# Patient Record
Sex: Female | Born: 1945 | Race: White | Hispanic: No | Marital: Married | Smoking: Never smoker
Health system: Southern US, Community
[De-identification: ages and names within clinical notes are randomized; demographics above are authoritative.]

## PROBLEM LIST (undated history)

## (undated) DIAGNOSIS — C50419 Malignant neoplasm of upper-outer quadrant of unspecified female breast: Secondary | ICD-10-CM

## (undated) DIAGNOSIS — C801 Malignant (primary) neoplasm, unspecified: Secondary | ICD-10-CM

## (undated) DIAGNOSIS — C50919 Malignant neoplasm of unspecified site of unspecified female breast: Secondary | ICD-10-CM

## (undated) DIAGNOSIS — D4819 Other specified neoplasm of uncertain behavior of connective and other soft tissue: Secondary | ICD-10-CM

## (undated) DIAGNOSIS — H43399 Other vitreous opacities, unspecified eye: Secondary | ICD-10-CM

## (undated) DIAGNOSIS — E119 Type 2 diabetes mellitus without complications: Secondary | ICD-10-CM

## (undated) DIAGNOSIS — D481 Neoplasm of uncertain behavior of connective and other soft tissue: Secondary | ICD-10-CM

## (undated) DIAGNOSIS — I10 Essential (primary) hypertension: Secondary | ICD-10-CM

## (undated) DIAGNOSIS — C50619 Malignant neoplasm of axillary tail of unspecified female breast: Secondary | ICD-10-CM

## (undated) DIAGNOSIS — Z1211 Encounter for screening for malignant neoplasm of colon: Secondary | ICD-10-CM

## (undated) DIAGNOSIS — R2230 Localized swelling, mass and lump, unspecified upper limb: Secondary | ICD-10-CM

## (undated) DIAGNOSIS — E785 Hyperlipidemia, unspecified: Secondary | ICD-10-CM

## (undated) HISTORY — DX: Localized swelling, mass and lump, unspecified upper limb: R22.30

## (undated) HISTORY — DX: Other vitreous opacities, unspecified eye: H43.399

## (undated) HISTORY — DX: Malignant (primary) neoplasm, unspecified: C80.1

## (undated) HISTORY — DX: Hyperlipidemia, unspecified: E78.5

## (undated) HISTORY — DX: Malignant neoplasm of axillary tail of unspecified female breast: C50.619

## (undated) HISTORY — DX: Essential (primary) hypertension: I10

## (undated) HISTORY — DX: Encounter for screening for malignant neoplasm of colon: Z12.11

## (undated) HISTORY — DX: Other specified neoplasm of uncertain behavior of connective and other soft tissue: D48.19

## (undated) HISTORY — DX: Neoplasm of uncertain behavior of connective and other soft tissue: D48.1

## (undated) HISTORY — DX: Malignant neoplasm of upper-outer quadrant of unspecified female breast: C50.419

## (undated) HISTORY — DX: Malignant neoplasm of unspecified site of unspecified female breast: C50.919

---
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
## 1975-12-18 HISTORY — PX: PARTIAL HYSTERECTOMY: SHX80

## 1988-12-17 DIAGNOSIS — E785 Hyperlipidemia, unspecified: Secondary | ICD-10-CM

## 1988-12-17 HISTORY — DX: Hyperlipidemia, unspecified: E78.5

## 2006-12-17 HISTORY — PX: COLONOSCOPY: SHX174

## 2007-10-14 ENCOUNTER — Ambulatory Visit: Payer: Self-pay | Admitting: Gastroenterology

## 2007-12-18 DIAGNOSIS — I10 Essential (primary) hypertension: Secondary | ICD-10-CM

## 2007-12-18 HISTORY — DX: Essential (primary) hypertension: I10

## 2008-12-17 DIAGNOSIS — C801 Malignant (primary) neoplasm, unspecified: Secondary | ICD-10-CM

## 2008-12-17 HISTORY — PX: BREAST SURGERY: SHX581

## 2008-12-17 HISTORY — DX: Malignant (primary) neoplasm, unspecified: C80.1

## 2008-12-17 HISTORY — PX: BREAST BIOPSY: SHX20

## 2009-02-09 ENCOUNTER — Ambulatory Visit: Payer: Self-pay | Admitting: General Surgery

## 2009-02-16 ENCOUNTER — Ambulatory Visit: Payer: Self-pay | Admitting: General Surgery

## 2009-02-21 ENCOUNTER — Ambulatory Visit: Payer: Self-pay | Admitting: General Surgery

## 2009-02-21 DIAGNOSIS — C50419 Malignant neoplasm of upper-outer quadrant of unspecified female breast: Secondary | ICD-10-CM

## 2009-02-21 DIAGNOSIS — C50919 Malignant neoplasm of unspecified site of unspecified female breast: Secondary | ICD-10-CM

## 2009-02-21 HISTORY — DX: Malignant neoplasm of upper-outer quadrant of unspecified female breast: C50.419

## 2009-02-21 HISTORY — DX: Malignant neoplasm of unspecified site of unspecified female breast: C50.919

## 2009-03-17 ENCOUNTER — Ambulatory Visit: Payer: Self-pay | Admitting: Radiation Oncology

## 2009-03-28 ENCOUNTER — Ambulatory Visit: Payer: Self-pay | Admitting: Radiation Oncology

## 2009-04-16 ENCOUNTER — Ambulatory Visit: Payer: Self-pay | Admitting: Radiation Oncology

## 2009-05-17 ENCOUNTER — Ambulatory Visit: Payer: Self-pay | Admitting: Radiation Oncology

## 2009-09-16 ENCOUNTER — Ambulatory Visit: Payer: Self-pay | Admitting: Radiation Oncology

## 2009-10-12 ENCOUNTER — Ambulatory Visit: Payer: Self-pay | Admitting: Radiation Oncology

## 2009-10-17 ENCOUNTER — Ambulatory Visit: Payer: Self-pay | Admitting: Radiation Oncology

## 2009-11-09 ENCOUNTER — Ambulatory Visit: Payer: Self-pay | Admitting: General Surgery

## 2010-03-17 ENCOUNTER — Ambulatory Visit: Payer: Self-pay | Admitting: Radiation Oncology

## 2010-03-23 ENCOUNTER — Ambulatory Visit: Payer: Self-pay | Admitting: Radiation Oncology

## 2010-04-16 ENCOUNTER — Ambulatory Visit: Payer: Self-pay | Admitting: Radiation Oncology

## 2010-04-26 ENCOUNTER — Ambulatory Visit: Payer: Self-pay | Admitting: General Surgery

## 2010-11-13 ENCOUNTER — Ambulatory Visit: Payer: Self-pay | Admitting: General Surgery

## 2011-03-21 ENCOUNTER — Ambulatory Visit: Payer: Self-pay | Admitting: Radiation Oncology

## 2011-04-17 ENCOUNTER — Ambulatory Visit: Payer: Self-pay | Admitting: Radiation Oncology

## 2011-05-23 ENCOUNTER — Ambulatory Visit: Payer: Self-pay | Admitting: General Surgery

## 2011-11-28 ENCOUNTER — Ambulatory Visit: Payer: Self-pay | Admitting: General Surgery

## 2012-03-19 ENCOUNTER — Ambulatory Visit: Payer: Self-pay | Admitting: Radiation Oncology

## 2012-04-16 ENCOUNTER — Ambulatory Visit: Payer: Self-pay | Admitting: Radiation Oncology

## 2012-07-28 ENCOUNTER — Ambulatory Visit: Payer: Self-pay

## 2012-08-17 ENCOUNTER — Ambulatory Visit: Payer: Self-pay

## 2012-09-16 ENCOUNTER — Ambulatory Visit: Payer: Self-pay

## 2012-12-01 ENCOUNTER — Ambulatory Visit: Payer: Self-pay | Admitting: General Surgery

## 2012-12-17 DIAGNOSIS — H43399 Other vitreous opacities, unspecified eye: Secondary | ICD-10-CM

## 2012-12-17 HISTORY — DX: Other vitreous opacities, unspecified eye: H43.399

## 2013-03-18 ENCOUNTER — Ambulatory Visit: Payer: Self-pay | Admitting: Radiation Oncology

## 2013-04-15 DIAGNOSIS — I10 Essential (primary) hypertension: Secondary | ICD-10-CM | POA: Diagnosis not present

## 2013-04-15 DIAGNOSIS — E785 Hyperlipidemia, unspecified: Secondary | ICD-10-CM | POA: Diagnosis not present

## 2013-04-15 DIAGNOSIS — E119 Type 2 diabetes mellitus without complications: Secondary | ICD-10-CM | POA: Diagnosis not present

## 2013-04-16 ENCOUNTER — Ambulatory Visit: Payer: Self-pay | Admitting: Radiation Oncology

## 2013-06-10 ENCOUNTER — Encounter: Payer: Self-pay | Admitting: *Deleted

## 2013-06-11 ENCOUNTER — Other Ambulatory Visit: Payer: Self-pay | Admitting: General Surgery

## 2013-06-11 DIAGNOSIS — C50419 Malignant neoplasm of upper-outer quadrant of unspecified female breast: Secondary | ICD-10-CM

## 2013-06-11 NOTE — Telephone Encounter (Signed)
Refill tamoxifen done

## 2013-12-02 ENCOUNTER — Encounter: Payer: Self-pay | Admitting: General Surgery

## 2013-12-02 ENCOUNTER — Ambulatory Visit: Payer: Self-pay | Admitting: General Surgery

## 2013-12-08 ENCOUNTER — Encounter: Payer: Self-pay | Admitting: General Surgery

## 2013-12-08 ENCOUNTER — Ambulatory Visit (INDEPENDENT_AMBULATORY_CARE_PROVIDER_SITE_OTHER): Payer: BC Managed Care – PPO | Admitting: General Surgery

## 2013-12-08 VITALS — BP 124/70 | HR 88 | Resp 14 | Ht 61.0 in | Wt 151.0 lb

## 2013-12-08 DIAGNOSIS — C50919 Malignant neoplasm of unspecified site of unspecified female breast: Secondary | ICD-10-CM

## 2013-12-08 NOTE — Patient Instructions (Signed)
Continue self breast exams. Call office for any new breast issues or concerns. 

## 2013-12-08 NOTE — Progress Notes (Signed)
Patient ID: Tammy Pope, female   DOB: [birth year], 67 y.o.   MRN: [id number]  Chief Complaint  Patient presents with  . Follow-up    mammogram    HPI Tammy Pope is a 67 y.o. female.  who presents for her follow up breast evaluation. The most recent mammogram was done on 12-02-13.  Patient does perform regular self breast checks and gets regular mammograms done.  No new breast issues. Tolerating Tamoxifen without ill effect.  No history of vaginal bleeding.   HPI  Past Medical History  Diagnosis Date  . Cancer 2010    right breast ductal carcinoma in situ  . Hypertension 2009  . 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 Special screening for malignant neoplasms, colon   . Hyperlipidemia 1990  . Malignant neoplasm of upper-outer quadrant of female breast   . Floaters 2014    Past Surgical History  Procedure Laterality Date  . Appendectomy  1956  . Breast surgery Right 2010    wide excision  . Breast biopsy Right 2010    stereo  . Partial hysterectomy  1977  . Colonoscopy  2008    ??? Md    Family History  Problem Relation Age of Onset  . Cancer Other     unknown family members with ovarian,colon,breast cancers    Social History History  Substance Use Topics  . Smoking status: Never Smoker   . Smokeless tobacco: Not on file  . Alcohol Use: No    Allergies  Allergen Reactions  . Sulfa Antibiotics Rash    Current Outpatient Prescriptions  Medication Sig Dispense Refill  . aspirin 81 MG tablet Take 81 mg by mouth daily.      . CRESTOR 5 MG tablet Take 5 mg by mouth daily at 6 PM.       . metFORMIN (GLUCOPHAGE) 500 MG tablet Take 500 mg by mouth 2 (two) times daily with a meal.       . ONE TOUCH ULTRA TEST test strip       . tamoxifen (NOLVADEX) 20 MG tablet TAKE 1 TABLET BY MOUTH EVERY DAY  90 tablet  3  . valsartan-hydrochlorothiazide (DIOVAN-HCT) 320-25 MG per tablet Take 1 tablet by mouth daily.        No current  facility-administered medications for this visit.    Review of Systems Review of Systems  Constitutional: Negative.   Respiratory: Negative.   Cardiovascular: Negative.     Blood pressure 124/70, pulse 88, resp. rate 14, height 5\' 1"  (1.549 m), weight 151 lb (68.493 kg).  Physical Exam Physical Exam  Constitutional: She is oriented to person, place, and time. She appears well-developed and well-nourished.  Neck: Neck supple.  Cardiovascular: Normal rate, regular rhythm and normal heart sounds.   Pulmonary/Chest: Effort normal and breath sounds normal. Right breast exhibits no inverted nipple, no mass, no nipple discharge, no skin change and no tenderness. Left breast exhibits no inverted nipple, no mass, no nipple discharge, no skin change and no tenderness.  Well healed incision right breast upper outer quadrant.   Lymphadenopathy:    She has no cervical adenopathy.    She has no axillary adenopathy.  Neurological: She is alert and oriented to person, place, and time.  Skin: Skin is warm and dry.    Data Reviewed December 02, 2013 bilateral mammograms were reviewed. No interval change.  BI-RAD-2.  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  Plan        The patient will continue her present tamoxifen. We'll plan for a followup exam with bilateral mammograms in one year.One year bilateral diagnostic mammogram and office visit.     Tammy Pope 12/08/2013, 7:36 PM

## 2013-12-31 ENCOUNTER — Encounter: Payer: Self-pay | Admitting: General Surgery

## 2014-06-27 ENCOUNTER — Other Ambulatory Visit: Payer: Self-pay | Admitting: General Surgery

## 2014-07-06 ENCOUNTER — Telehealth: Payer: Self-pay | Admitting: *Deleted

## 2014-07-06 NOTE — Telephone Encounter (Signed)
She called with questions about Oncotype.

## 2014-07-06 NOTE — Telephone Encounter (Signed)
The patient had called because [facility] had contacted her stating she would have a $1000 deductible for the Oncotype DX testing. I told her I would contact them as well. During that time Delsa Sale from [facility] called to say that the sample they received was from March 2010 and that they could only accept specimens less than 69 years old. Test cancelled, pt aware.

## 2014-07-08 NOTE — Telephone Encounter (Signed)
I talked with the patient. She did say she had only met $23 of her $1000 deductible so she would be paying for the test. According to [facility] they do fill with insurance. Patient prefers to keep taking the Tamoxifen and not have the testing completed.

## 2014-10-06 DIAGNOSIS — E119 Type 2 diabetes mellitus without complications: Secondary | ICD-10-CM | POA: Diagnosis not present

## 2014-10-06 DIAGNOSIS — E785 Hyperlipidemia, unspecified: Secondary | ICD-10-CM | POA: Diagnosis not present

## 2014-10-06 DIAGNOSIS — I10 Essential (primary) hypertension: Secondary | ICD-10-CM | POA: Diagnosis not present

## 2014-10-18 ENCOUNTER — Encounter: Payer: Self-pay | Admitting: General Surgery

## 2014-12-03 ENCOUNTER — Encounter: Payer: Self-pay | Admitting: General Surgery

## 2014-12-03 ENCOUNTER — Ambulatory Visit: Payer: Self-pay | Admitting: General Surgery

## 2014-12-14 ENCOUNTER — Ambulatory Visit (INDEPENDENT_AMBULATORY_CARE_PROVIDER_SITE_OTHER): Payer: BC Managed Care – PPO | Admitting: General Surgery

## 2014-12-14 ENCOUNTER — Other Ambulatory Visit: Payer: BC Managed Care – PPO

## 2014-12-14 ENCOUNTER — Encounter: Payer: Self-pay | Admitting: General Surgery

## 2014-12-14 VITALS — BP 124/64 | HR 68 | Resp 14 | Ht 61.0 in

## 2014-12-14 DIAGNOSIS — N6459 Other signs and symptoms in breast: Secondary | ICD-10-CM

## 2014-12-14 DIAGNOSIS — D0511 Intraductal carcinoma in situ of right breast: Secondary | ICD-10-CM

## 2014-12-14 DIAGNOSIS — C50911 Malignant neoplasm of unspecified site of right female breast: Secondary | ICD-10-CM | POA: Diagnosis not present

## 2014-12-14 DIAGNOSIS — R2232 Localized swelling, mass and lump, left upper limb: Secondary | ICD-10-CM

## 2014-12-14 HISTORY — PX: OTHER SURGICAL HISTORY: SHX169

## 2014-12-14 NOTE — Progress Notes (Signed)
Patient ID: Tammy Pope, female   DOB: 1946/08/07, 68 y.o.   MRN: [id number]  Chief Complaint  Patient presents with  . Follow-up    mammogram    HPI Tammy Pope is a 68 y.o. female. who presents for a breast evaluation. The most recent mammogram was done on 12/03/14 .  Patient does perform regular self breast checks and gets regular mammograms done.    The patient reports no change on her own self-exam and denies any episodes of trauma to either breast.  HPI  Past Medical History  Diagnosis Date  . Hypertension 2009  . 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 Special screening for malignant neoplasms, colon   . Hyperlipidemia 1990  . Floaters 2014  . Cancer 2010    right breast ductal carcinoma in situ  . 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 ER 90%, PR 50%, MammoSite partial breast radiation    Past Surgical History  Procedure Laterality Date  . Appendectomy  1956  . Partial hysterectomy  1977  . Colonoscopy  2008    ??? Md  . Breast surgery Right 2010    wide excision  . Breast biopsy Right 2010    stereo    Family History  Problem Relation Age of Onset  . Cancer Other     unknown family members with ovarian,colon,breast cancers    Social History History  Substance Use Topics  . Smoking status: Never Smoker   . Smokeless tobacco: Not on file  . Alcohol Use: No    Allergies  Allergen Reactions  . Sulfa Antibiotics Rash    Current Outpatient Prescriptions  Medication Sig Dispense Refill  . aspirin 81 MG tablet Take 81 mg by mouth daily.    . CRESTOR 5 MG tablet Take 5 mg by mouth daily at 6 PM.     . metFORMIN (GLUCOPHAGE) 500 MG tablet Take 500 mg by mouth 2 (two) times daily with a meal.     . ONE TOUCH ULTRA TEST test strip     . tamoxifen (NOLVADEX) 20 MG tablet TAKE 1 TABLET BY MOUTH EVERY DAY 90  tablet 3  . valsartan-hydrochlorothiazide (DIOVAN-HCT) 320-25 MG per tablet Take 1 tablet by mouth daily.      No current facility-administered medications for this visit.    Review of Systems Review of Systems  Constitutional: Negative.   Respiratory: Negative.   Cardiovascular: Negative.     Blood pressure 124/64, pulse 68, resp. rate 14, height 5\' 1"  (1.549 m).  Physical Exam Physical Exam  Constitutional: She is oriented to person, place, and time. She appears well-developed and well-nourished.  Eyes: Conjunctivae are normal. No scleral icterus.  Neck: Neck supple.  Cardiovascular: Normal rate, regular rhythm and normal heart sounds.   Pulmonary/Chest: Effort normal and breath sounds normal. Right breast exhibits no inverted nipple, no mass, no nipple discharge, no skin change and no tenderness. Left breast exhibits no inverted nipple, no mass, no nipple discharge, no skin change and no tenderness.    Thickening at the bottom of the left axilla. Right breast incision well healed 9 o'clock.   Abdominal: Soft. Bowel sounds are normal. There is no tenderness.  Lymphadenopathy:    She has no cervical adenopathy.    She has no axillary adenopathy.  Neurological: She is alert and oriented to person, place,  and time.  Skin: Skin is warm and dry.    Data Reviewed Bilateral diagnostic mammograms dated 12/03/2014 were reviewed. I read-2.  On independent review, there is no parenchymal density in the upper-outer quadrant of the left breast. Adenopathy is identified in the left axilla not previously appreciated.  Ultrasound examination of the superior aspect of the left breast and axilla showed one small normal-appearing node measuring 0.67 x 0.5 x 0.86 at the lower limit of the axilla. Adjacent to this was ill-defined multilobulated hypoechoic mass measuring 1.8 x 1.8 x 2.6 cm. No significant vascular flow was appreciated.  The patient was amenable for biopsy of the left axillary  tail/left axillary mass. This was completed making use of 10 mL of 0.5% Xylocaine with 0.25% Marcaine with 1-200,000 of epinephrine. ChloraPrep was applied to the skin. A 14-gauge Finesse device was utilized and multiple core samples from multiple locations completed without difficulty. A postbiopsy clip was placed.  Skin defect was closed with benzoin and Steri-Strips followed by Telfa and Tegaderm dressing.  Written instructions for post procedure wound care were provided.   Assessment    No evidence of recurrent right breast DCIS.  New left breast/axillary mass.    Plan       The patient will be contacted when biopsy results are available and future treatment plans made based on those results. PCP:  Kirkland Hun 12/15/2014, 7:31 AM

## 2014-12-14 NOTE — Patient Instructions (Signed)

## 2014-12-15 ENCOUNTER — Telehealth: Payer: Self-pay | Admitting: *Deleted

## 2014-12-15 ENCOUNTER — Other Ambulatory Visit: Payer: Self-pay | Admitting: *Deleted

## 2014-12-15 DIAGNOSIS — C761 Malignant neoplasm of thorax: Secondary | ICD-10-CM

## 2014-12-15 DIAGNOSIS — R2230 Localized swelling, mass and lump, unspecified upper limb: Secondary | ICD-10-CM | POA: Insufficient documentation

## 2014-12-15 DIAGNOSIS — D0511 Intraductal carcinoma in situ of right breast: Secondary | ICD-10-CM

## 2014-12-15 HISTORY — DX: Localized swelling, mass and lump, unspecified upper limb: R22.30

## 2014-12-15 NOTE — Telephone Encounter (Signed)
She is aware of appointment for tomorrow with Dr. Bary Castilla. She is asking for some nerve medication with the upcoming appointment. She is aware that the news tomorrow may not be good news. Dr Bary Castilla prefers to wait until tomorrow to discuss anxiety medications, pt agrees. States her husband is coming with her.

## 2014-12-16 ENCOUNTER — Ambulatory Visit: Payer: Self-pay | Admitting: Oncology

## 2014-12-16 ENCOUNTER — Ambulatory Visit (INDEPENDENT_AMBULATORY_CARE_PROVIDER_SITE_OTHER): Payer: BLUE CROSS/BLUE SHIELD | Admitting: General Surgery

## 2014-12-16 ENCOUNTER — Encounter: Payer: Self-pay | Admitting: General Surgery

## 2014-12-16 VITALS — BP 130/82 | HR 94 | Resp 12 | Ht 61.0 in | Wt 147.0 lb

## 2014-12-16 DIAGNOSIS — C50619 Malignant neoplasm of axillary tail of unspecified female breast: Secondary | ICD-10-CM

## 2014-12-16 DIAGNOSIS — D0511 Intraductal carcinoma in situ of right breast: Secondary | ICD-10-CM

## 2014-12-16 DIAGNOSIS — C761 Malignant neoplasm of thorax: Secondary | ICD-10-CM

## 2014-12-16 HISTORY — DX: Malignant neoplasm of axillary tail of unspecified female breast: C50.619

## 2014-12-16 NOTE — Progress Notes (Signed)
Patient ID: Tammy Pope, female   DOB: 16-Feb-1946, 68 y.o.   MRN: [id number]  Chief Complaint  Patient presents with  . Follow-up    HPI Tammy Pope is a 68 y.o. female.  Here today for follow up left breast/axilla mass biopsy completed on December 29.Tammy Pope, Tammy Pope, present, and her daughter, Tammy Pope.   HPI  Past Medical History  Diagnosis Date  . Hypertension 2009  . 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 Special screening for malignant neoplasms, colon   . Hyperlipidemia 1990  . Floaters 2014  . Cancer 2010    right breast ductal carcinoma in situ  . 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 ER 90%, PR 50%, MammoSite partial breast radiation    Past Surgical History  Procedure Laterality Date  . Appendectomy  1956  . Partial hysterectomy  1977  . Colonoscopy  2008    ??? Md  . Breast surgery Right 2010    wide excision  . Breast biopsy Right 2010    stereo  . Left axilla biopsy Left 12-14-14    Family History  Problem Relation Age of Onset  . Cancer Other     unknown family members with ovarian,colon,breast cancers    Social History History  Substance Use Topics  . Smoking status: Never Smoker   . Smokeless tobacco: Not on file  . Alcohol Use: No    Allergies  Allergen Reactions  . Sulfa Antibiotics Rash    Current Outpatient Prescriptions  Medication Sig Dispense Refill  . aspirin 81 MG tablet Take 81 mg by mouth daily.    . CRESTOR 5 MG tablet Take 5 mg by mouth daily at 6 PM.     . metFORMIN (GLUCOPHAGE) 500 MG tablet Take 500 mg by mouth 2 (two) times daily with a meal.     . ONE TOUCH ULTRA TEST test strip     . tamoxifen (NOLVADEX) 20 MG tablet TAKE 1 TABLET BY MOUTH EVERY DAY 90 tablet 3  . valsartan-hydrochlorothiazide (DIOVAN-HCT) 320-25 MG per tablet Take 1 tablet by mouth daily.      No  current facility-administered medications for this visit.    Review of Systems Review of Systems  Constitutional: Negative.   Respiratory: Negative.   Cardiovascular: Negative.     Blood pressure 130/82, pulse 94, resp. rate 12, height 5\' 1"  (1.549 m), weight 147 lb (66.679 kg).  Physical Exam Physical Exam  Constitutional: She is oriented to person, place, and time. She appears well-developed and well-nourished.  Pulmonary/Chest:  Left axilla wound clean, healing well, steri strip intact. Minimal bruising.  Neurological: She is alert and oriented to person, place, and time.  Skin: Skin is warm and dry.    Data Reviewed Pathology shows high-grade carcinoma. Receptor status pending. No lymph node tissue noted.  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    Plan    Details of the pathology report were reviewed. It's unclear whether this is a breast primary (sees what I favor) rather than metastatic disease to an axillary node (less likely). The absence of any mass on mammography may be a combination of location and the potential for lobular features.  With this sizable tumor, she would certainly be a candidate for neoadjuvant chemotherapy considering the high-grade status noted on pathology. The  advisability of meeting with medical oncology early has been discussed. She is aware that frequently chemotherapy will be initiated first to see response and he confirmed that the appropriate age and has been chosen and then surgery would follow.  The patient's husband expressed concern that she is expressed to him about the possibility of losing her breast. Even without a clinical response to neoadjuvant chemotherapy mastectomy would not be mandatory.  I've recommended a breast MRI to help determine if any other areas of concern are evident in the left breast and to help better define that this is a breast primary.  We'll obtain baseline laboratory studies today  including a CBC, comprehensive metabolic panel, CEA and CA 27-29. Her graft the patient will contact the MRI center (would previously contacted her) to schedule her MRI. Referral to medical oncology is in process.  The likelihood that a port for chemotherapy administration would be requested was discussed with the patient and her family. The risks associated with the procedure including bleeding, infection and lung injury were reviewed. The bottom line would be it's unlikely should be able tolerate treatment without a port, and the small but real risks are our off set by facilitating her chemotherapy treatments.  Timing for port placement will be put on hold pending medical oncology evaluation. An appointment has been scheduled for the patient at the [facility] for 12-21-14 at 3 pm.  The patient requested an "nerve pill" to help with adjustment to this new, unexpected diagnosis of cancer. A prescription for Xanax 0.5 mg, #30 with the inscription one by mouth every 8 hours when necessary for anxiety/nerves with no refills was provided.     PCP:  Kirkland Hun 12/16/2014, 1:56 PM

## 2014-12-16 NOTE — Patient Instructions (Signed)
Continue self breast exams. Call office for any new breast issues or concerns. 

## 2014-12-17 LAB — COMPREHENSIVE METABOLIC PANEL
ALT: 28 IU/L (ref 0–32)
AST: 43 IU/L — ABNORMAL HIGH (ref 0–40)
Albumin/Globulin Ratio: 1.4 (ref 1.1–2.5)
Albumin: 4.5 g/dL (ref 3.6–4.8)
Alkaline Phosphatase: 90 IU/L (ref 39–117)
BUN/Creatinine Ratio: 14 (ref 11–26)
BUN: 11 mg/dL (ref 8–27)
CHLORIDE: 96 mmol/L — AB (ref 97–108)
CO2: 25 mmol/L (ref 18–29)
CREATININE: 0.76 mg/dL (ref 0.57–1.00)
Calcium: 9.7 mg/dL (ref 8.7–10.3)
GFR calc Af Amer: 93 mL/min/{1.73_m2} (ref >59)
GFR calc non Af Amer: 80 mL/min/{1.73_m2} (ref >59)
Globulin, Total: 3.3 g/dL (ref 1.5–4.5)
Glucose: 120 mg/dL — ABNORMAL HIGH (ref 65–99)
Potassium: 3.7 mmol/L (ref 3.5–5.2)
SODIUM: 139 mmol/L (ref 134–144)
TOTAL PROTEIN: 7.8 g/dL (ref 6.0–8.5)
Total Bilirubin: 0.5 mg/dL (ref 0.0–1.2)

## 2014-12-17 LAB — CBC WITH DIFFERENTIAL
BASOS ABS: 0 10*3/uL (ref 0.0–0.2)
Basos: 0 %
EOS ABS: 0.4 10*3/uL (ref 0.0–0.4)
Eos: 4 %
HCT: 38.9 % (ref 34.0–46.6)
Hemoglobin: 12.9 g/dL (ref 11.1–15.9)
IMMATURE GRANULOCYTES: 0 %
Immature Grans (Abs): 0 10*3/uL (ref 0.0–0.1)
Lymphocytes Absolute: 2.9 10*3/uL (ref 0.7–3.1)
Lymphs: 27 %
MCH: 30 pg (ref 26.6–33.0)
MCHC: 33.2 g/dL (ref 31.5–35.7)
MCV: 91 fL (ref 79–97)
MONOCYTES: 7 %
Monocytes Absolute: 0.7 10*3/uL (ref 0.1–0.9)
Neutrophils Absolute: 6.6 10*3/uL (ref 1.4–7.0)
Neutrophils Relative %: 62 %
PLATELETS: 206 10*3/uL (ref 150–379)
RBC: 4.3 x10E6/uL (ref 3.77–5.28)
RDW: 13.7 % (ref 12.3–15.4)
WBC: 10.7 10*3/uL (ref 3.4–10.8)

## 2014-12-17 LAB — CANCER ANTIGEN 27.29: CA 27.29: 35.9 U/mL (ref 0.0–38.6)

## 2014-12-17 LAB — CEA: CEA: 2.2 ng/mL (ref 0.0–4.7)

## 2014-12-20 ENCOUNTER — Telehealth: Payer: Self-pay | Admitting: *Deleted

## 2014-12-20 NOTE — Telephone Encounter (Signed)
Message has been left on patient's number, spouse's number Jori Moll), and daughter's number (Tammy Madren).   Per [employer], insurance termed on 12-16-14. There is also a question as to if patient only has Medicare Part A coverage or both Part A and Part B coverage.   Patient is scheduled for a breast MRI on 12-25-14. We need to find out what she is covered under for January 2016 and will need copy of insurance card for pre-cert.

## 2014-12-20 NOTE — Telephone Encounter (Signed)
Patient called the office back and was notified accordingly. She will check on this and notify the office once she finds out details.

## 2014-12-21 ENCOUNTER — Ambulatory Visit: Payer: BC Managed Care – PPO

## 2014-12-21 ENCOUNTER — Ambulatory Visit: Payer: Self-pay | Admitting: Oncology

## 2014-12-25 ENCOUNTER — Ambulatory Visit
Admission: RE | Admit: 2014-12-25 | Discharge: 2014-12-25 | Disposition: A | Discharge status: Home / Self Care | Payer: BLUE CROSS/BLUE SHIELD | Source: Ambulatory Visit | Attending: General Surgery | Admitting: General Surgery

## 2014-12-25 DIAGNOSIS — C761 Malignant neoplasm of thorax: Secondary | ICD-10-CM

## 2014-12-25 DIAGNOSIS — D0511 Intraductal carcinoma in situ of right breast: Secondary | ICD-10-CM

## 2014-12-25 DIAGNOSIS — C50412 Malignant neoplasm of upper-outer quadrant of left female breast: Secondary | ICD-10-CM | POA: Diagnosis not present

## 2014-12-25 MED ORDER — GADOBENATE DIMEGLUMINE 529 MG/ML IV SOLN
14.0000 mL | Freq: Once | INTRAVENOUS | Status: AC | PRN
  Administered 2014-12-25: 14 mL via INTRAVENOUS

## 2014-12-27 ENCOUNTER — Telehealth: Payer: Self-pay

## 2014-12-27 DIAGNOSIS — C50612 Malignant neoplasm of axillary tail of left female breast: Secondary | ICD-10-CM | POA: Diagnosis not present

## 2014-12-27 DIAGNOSIS — Z170 Estrogen receptor positive status [ER+]: Secondary | ICD-10-CM | POA: Diagnosis not present

## 2014-12-27 NOTE — Telephone Encounter (Signed)
Christina at Dr Metro Kung office called and said that Dr Oliva Bustard wants the patient to have a port placement. They want to start chemo on 01/03/15. The patient was seen here last on 12/16/14 & 12/14/14. Just need to know if it is all right to schedule her and if she needs to be seen here prior to surgery.

## 2014-12-28 ENCOUNTER — Telehealth: Payer: Self-pay

## 2014-12-28 ENCOUNTER — Ambulatory Visit: Payer: Self-pay | Admitting: Oncology

## 2014-12-28 ENCOUNTER — Other Ambulatory Visit: Payer: Self-pay | Admitting: General Surgery

## 2014-12-28 DIAGNOSIS — C7951 Secondary malignant neoplasm of bone: Secondary | ICD-10-CM | POA: Diagnosis not present

## 2014-12-28 DIAGNOSIS — C7802 Secondary malignant neoplasm of left lung: Secondary | ICD-10-CM | POA: Diagnosis not present

## 2014-12-28 DIAGNOSIS — C50919 Malignant neoplasm of unspecified site of unspecified female breast: Secondary | ICD-10-CM | POA: Diagnosis not present

## 2014-12-28 DIAGNOSIS — C50912 Malignant neoplasm of unspecified site of left female breast: Secondary | ICD-10-CM | POA: Diagnosis not present

## 2014-12-28 DIAGNOSIS — C779 Secondary and unspecified malignant neoplasm of lymph node, unspecified: Secondary | ICD-10-CM | POA: Diagnosis not present

## 2014-12-28 DIAGNOSIS — C7800 Secondary malignant neoplasm of unspecified lung: Secondary | ICD-10-CM | POA: Diagnosis not present

## 2014-12-28 DIAGNOSIS — C7801 Secondary malignant neoplasm of right lung: Secondary | ICD-10-CM | POA: Diagnosis not present

## 2014-12-28 DIAGNOSIS — C761 Malignant neoplasm of thorax: Secondary | ICD-10-CM

## 2014-12-28 NOTE — Telephone Encounter (Signed)
Spoke with patient about scheduling her port placement. Patient is scheduled for surgery at [facility] on 12/31/14. She will pre admit by phone. She is aware to call the day before to find out her arrival time. Patient is aware of date and all surgery instructions.

## 2014-12-28 NOTE — Telephone Encounter (Signed)
Spoke with Tammy Pope at the [facility] and asked her know to notify Dr Metro Kung office that we will be placing the patient's port on 12/31/14. She is aware and will relay the message.

## 2014-12-28 NOTE — Telephone Encounter (Signed)
Scheduling slip on your desk.

## 2014-12-30 ENCOUNTER — Ambulatory Visit: Payer: Self-pay | Admitting: Anesthesiology

## 2014-12-30 DIAGNOSIS — C50919 Malignant neoplasm of unspecified site of unspecified female breast: Secondary | ICD-10-CM | POA: Diagnosis not present

## 2014-12-30 DIAGNOSIS — Z0189 Encounter for other specified special examinations: Secondary | ICD-10-CM | POA: Diagnosis not present

## 2014-12-30 DIAGNOSIS — C50612 Malignant neoplasm of axillary tail of left female breast: Secondary | ICD-10-CM | POA: Diagnosis not present

## 2014-12-31 ENCOUNTER — Ambulatory Visit: Payer: Self-pay | Admitting: General Surgery

## 2014-12-31 DIAGNOSIS — C7951 Secondary malignant neoplasm of bone: Secondary | ICD-10-CM | POA: Diagnosis not present

## 2014-12-31 DIAGNOSIS — E785 Hyperlipidemia, unspecified: Secondary | ICD-10-CM | POA: Diagnosis not present

## 2014-12-31 DIAGNOSIS — C761 Malignant neoplasm of thorax: Secondary | ICD-10-CM | POA: Diagnosis not present

## 2014-12-31 DIAGNOSIS — I10 Essential (primary) hypertension: Secondary | ICD-10-CM | POA: Diagnosis not present

## 2014-12-31 DIAGNOSIS — E119 Type 2 diabetes mellitus without complications: Secondary | ICD-10-CM | POA: Diagnosis not present

## 2014-12-31 DIAGNOSIS — Z880 Allergy status to penicillin: Secondary | ICD-10-CM | POA: Diagnosis not present

## 2014-12-31 DIAGNOSIS — Z8041 Family history of malignant neoplasm of ovary: Secondary | ICD-10-CM | POA: Diagnosis not present

## 2014-12-31 DIAGNOSIS — Z7982 Long term (current) use of aspirin: Secondary | ICD-10-CM | POA: Diagnosis not present

## 2014-12-31 DIAGNOSIS — D0511 Intraductal carcinoma in situ of right breast: Secondary | ICD-10-CM

## 2014-12-31 DIAGNOSIS — C50912 Malignant neoplasm of unspecified site of left female breast: Secondary | ICD-10-CM | POA: Diagnosis not present

## 2014-12-31 DIAGNOSIS — I517 Cardiomegaly: Secondary | ICD-10-CM | POA: Diagnosis not present

## 2014-12-31 DIAGNOSIS — Z800 Family history of malignant neoplasm of digestive organs: Secondary | ICD-10-CM | POA: Diagnosis not present

## 2014-12-31 DIAGNOSIS — Z79899 Other long term (current) drug therapy: Secondary | ICD-10-CM | POA: Diagnosis not present

## 2014-12-31 DIAGNOSIS — Z452 Encounter for adjustment and management of vascular access device: Secondary | ICD-10-CM | POA: Diagnosis not present

## 2014-12-31 DIAGNOSIS — Z803 Family history of malignant neoplasm of breast: Secondary | ICD-10-CM | POA: Diagnosis not present

## 2014-12-31 DIAGNOSIS — Z882 Allergy status to sulfonamides status: Secondary | ICD-10-CM | POA: Diagnosis not present

## 2015-01-03 ENCOUNTER — Encounter: Payer: Self-pay | Admitting: General Surgery

## 2015-01-03 DIAGNOSIS — Z170 Estrogen receptor positive status [ER+]: Secondary | ICD-10-CM | POA: Diagnosis not present

## 2015-01-03 DIAGNOSIS — C773 Secondary and unspecified malignant neoplasm of axilla and upper limb lymph nodes: Secondary | ICD-10-CM | POA: Diagnosis not present

## 2015-01-03 DIAGNOSIS — C50612 Malignant neoplasm of axillary tail of left female breast: Secondary | ICD-10-CM | POA: Diagnosis not present

## 2015-01-03 DIAGNOSIS — C7951 Secondary malignant neoplasm of bone: Secondary | ICD-10-CM | POA: Diagnosis not present

## 2015-01-03 LAB — COMPREHENSIVE METABOLIC PANEL
ALBUMIN: 3.6 g/dL (ref 3.4–5.0)
ALT: 36 U/L
ANION GAP: 10 (ref 7–16)
Alkaline Phosphatase: 99 U/L
BUN: 10 mg/dL (ref 7–18)
Bilirubin,Total: 0.4 mg/dL (ref 0.2–1.0)
CALCIUM: 8.7 mg/dL (ref 8.5–10.1)
Chloride: 102 mmol/L (ref 98–107)
Co2: 28 mmol/L (ref 21–32)
Creatinine: 0.79 mg/dL (ref 0.60–1.30)
EGFR (African American): >60
GLUCOSE: 123 mg/dL — AB (ref 65–99)
Osmolality: 280 (ref 275–301)
POTASSIUM: 3.8 mmol/L (ref 3.5–5.1)
SGOT(AST): 41 U/L — ABNORMAL HIGH (ref 15–37)
Sodium: 140 mmol/L (ref 136–145)
Total Protein: 7.8 g/dL (ref 6.4–8.2)

## 2015-01-03 LAB — CBC CANCER CENTER
Basophil #: 0.1 x10 3/mm (ref 0.0–0.1)
Basophil %: 1 %
EOS PCT: 3.7 %
Eosinophil #: 0.4 x10 3/mm (ref 0.0–0.7)
HCT: 36.4 % (ref 35.0–47.0)
HGB: 12.2 g/dL (ref 12.0–16.0)
LYMPHS ABS: 2.3 x10 3/mm (ref 1.0–3.6)
Lymphocyte %: 23.3 %
MCH: 29.9 pg (ref 26.0–34.0)
MCHC: 33.4 g/dL (ref 32.0–36.0)
MCV: 90 fL (ref 80–100)
MONO ABS: 0.6 x10 3/mm (ref 0.2–0.9)
MONOS PCT: 5.8 %
NEUTROS ABS: 6.5 x10 3/mm (ref 1.4–6.5)
Neutrophil %: 66.2 %
Platelet: 182 x10 3/mm (ref 150–440)
RBC: 4.06 10*6/uL (ref 3.80–5.20)
RDW: 13.7 % (ref 11.5–14.5)
WBC: 9.8 x10 3/mm (ref 3.6–11.0)

## 2015-01-10 LAB — CBC CANCER CENTER
BASOS PCT: 0.7 %
Basophil #: 0.1 x10 3/mm (ref 0.0–0.1)
Eosinophil #: 0.1 x10 3/mm (ref 0.0–0.7)
Eosinophil %: 0.6 %
HCT: 36.9 % (ref 35.0–47.0)
HGB: 12 g/dL (ref 12.0–16.0)
Lymphocyte #: 3.3 x10 3/mm (ref 1.0–3.6)
Lymphocyte %: 16.7 %
MCH: 28.9 pg (ref 26.0–34.0)
MCHC: 32.6 g/dL (ref 32.0–36.0)
MCV: 89 fL (ref 80–100)
Monocyte #: 1.5 x10 3/mm — ABNORMAL HIGH (ref 0.2–0.9)
Monocyte %: 7.3 %
NEUTROS ABS: 14.9 x10 3/mm — AB (ref 1.4–6.5)
Neutrophil %: 74.7 %
Platelet: 252 x10 3/mm (ref 150–440)
RBC: 4.16 10*6/uL (ref 3.80–5.20)
RDW: 13.7 % (ref 11.5–14.5)
WBC: 20 x10 3/mm — ABNORMAL HIGH (ref 3.6–11.0)

## 2015-01-12 ENCOUNTER — Other Ambulatory Visit: Payer: Self-pay | Admitting: General Surgery

## 2015-01-13 ENCOUNTER — Telehealth: Payer: Self-pay | Admitting: *Deleted

## 2015-01-13 NOTE — Telephone Encounter (Signed)
I talked with the patient and she does take the Xanax occasionally, ok for this time per Dr. Bary Castilla. Patient aware to pick up RX.

## 2015-01-17 ENCOUNTER — Ambulatory Visit: Payer: Self-pay | Admitting: Oncology

## 2015-01-17 DIAGNOSIS — Z90710 Acquired absence of both cervix and uterus: Secondary | ICD-10-CM | POA: Diagnosis not present

## 2015-01-17 DIAGNOSIS — C7951 Secondary malignant neoplasm of bone: Secondary | ICD-10-CM | POA: Diagnosis not present

## 2015-01-17 DIAGNOSIS — Z170 Estrogen receptor positive status [ER+]: Secondary | ICD-10-CM | POA: Diagnosis not present

## 2015-01-17 DIAGNOSIS — I10 Essential (primary) hypertension: Secondary | ICD-10-CM | POA: Diagnosis not present

## 2015-01-17 DIAGNOSIS — Z9049 Acquired absence of other specified parts of digestive tract: Secondary | ICD-10-CM | POA: Diagnosis not present

## 2015-01-17 DIAGNOSIS — E780 Pure hypercholesterolemia: Secondary | ICD-10-CM | POA: Diagnosis not present

## 2015-01-17 DIAGNOSIS — Z853 Personal history of malignant neoplasm of breast: Secondary | ICD-10-CM | POA: Diagnosis not present

## 2015-01-17 DIAGNOSIS — C773 Secondary and unspecified malignant neoplasm of axilla and upper limb lymph nodes: Secondary | ICD-10-CM | POA: Diagnosis not present

## 2015-01-17 DIAGNOSIS — R918 Other nonspecific abnormal finding of lung field: Secondary | ICD-10-CM | POA: Diagnosis not present

## 2015-01-17 DIAGNOSIS — E119 Type 2 diabetes mellitus without complications: Secondary | ICD-10-CM | POA: Diagnosis not present

## 2015-01-17 DIAGNOSIS — Z7982 Long term (current) use of aspirin: Secondary | ICD-10-CM | POA: Diagnosis not present

## 2015-01-17 DIAGNOSIS — F419 Anxiety disorder, unspecified: Secondary | ICD-10-CM | POA: Diagnosis not present

## 2015-01-17 DIAGNOSIS — R42 Dizziness and giddiness: Secondary | ICD-10-CM | POA: Diagnosis not present

## 2015-01-17 DIAGNOSIS — Z79899 Other long term (current) drug therapy: Secondary | ICD-10-CM | POA: Diagnosis not present

## 2015-01-17 DIAGNOSIS — Z5111 Encounter for antineoplastic chemotherapy: Secondary | ICD-10-CM | POA: Diagnosis not present

## 2015-01-17 DIAGNOSIS — C50612 Malignant neoplasm of axillary tail of left female breast: Secondary | ICD-10-CM | POA: Diagnosis not present

## 2015-01-17 DIAGNOSIS — R5383 Other fatigue: Secondary | ICD-10-CM | POA: Diagnosis not present

## 2015-01-17 DIAGNOSIS — R531 Weakness: Secondary | ICD-10-CM | POA: Diagnosis not present

## 2015-01-17 LAB — CBC CANCER CENTER
BASOS PCT: 0.4 %
Basophil #: 0.1 x10 3/mm (ref 0.0–0.1)
Eosinophil #: 0 x10 3/mm (ref 0.0–0.7)
Eosinophil %: 0 %
HCT: 36.4 % (ref 35.0–47.0)
HGB: 12.1 g/dL (ref 12.0–16.0)
LYMPHS ABS: 2.1 x10 3/mm (ref 1.0–3.6)
LYMPHS PCT: 17.3 %
MCH: 29.1 pg (ref 26.0–34.0)
MCHC: 33.2 g/dL (ref 32.0–36.0)
MCV: 88 fL (ref 80–100)
MONO ABS: 0.6 x10 3/mm (ref 0.2–0.9)
MONOS PCT: 4.7 %
Neutrophil #: 9.4 x10 3/mm — ABNORMAL HIGH (ref 1.4–6.5)
Neutrophil %: 77.6 %
PLATELETS: 107 x10 3/mm — AB (ref 150–440)
RBC: 4.15 10*6/uL (ref 3.80–5.20)
RDW: 14 % (ref 11.5–14.5)
WBC: 12.1 x10 3/mm — ABNORMAL HIGH (ref 3.6–11.0)

## 2015-01-20 DIAGNOSIS — C50612 Malignant neoplasm of axillary tail of left female breast: Secondary | ICD-10-CM | POA: Diagnosis not present

## 2015-01-20 DIAGNOSIS — Z170 Estrogen receptor positive status [ER+]: Secondary | ICD-10-CM | POA: Diagnosis not present

## 2015-01-20 DIAGNOSIS — C7951 Secondary malignant neoplasm of bone: Secondary | ICD-10-CM | POA: Diagnosis not present

## 2015-01-20 DIAGNOSIS — C773 Secondary and unspecified malignant neoplasm of axilla and upper limb lymph nodes: Secondary | ICD-10-CM | POA: Diagnosis not present

## 2015-01-20 LAB — COMPREHENSIVE METABOLIC PANEL
ALBUMIN: 3.5 g/dL (ref 3.4–5.0)
ALT: 59 U/L (ref 14–63)
AST: 42 U/L — AB (ref 15–37)
Alkaline Phosphatase: 127 U/L — ABNORMAL HIGH (ref 46–116)
Anion Gap: 7 (ref 7–16)
BUN: 11 mg/dL (ref 7–18)
Bilirubin,Total: 0.3 mg/dL (ref 0.2–1.0)
Calcium, Total: 8.7 mg/dL (ref 8.5–10.1)
Chloride: 102 mmol/L (ref 98–107)
Co2: 35 mmol/L — ABNORMAL HIGH (ref 21–32)
Creatinine: 0.76 mg/dL (ref 0.60–1.30)
EGFR (African American): >60
GLUCOSE: 143 mg/dL — AB (ref 65–99)
Osmolality: 289 (ref 275–301)
POTASSIUM: 3.1 mmol/L — AB (ref 3.5–5.1)
Sodium: 144 mmol/L (ref 136–145)
TOTAL PROTEIN: 7.3 g/dL (ref 6.4–8.2)

## 2015-01-20 LAB — CBC CANCER CENTER
BASOS PCT: 1.2 %
Basophil #: 0.1 x10 3/mm (ref 0.0–0.1)
EOS PCT: 0.1 %
Eosinophil #: 0 x10 3/mm (ref 0.0–0.7)
HCT: 36.6 % (ref 35.0–47.0)
HGB: 12.2 g/dL (ref 12.0–16.0)
LYMPHS PCT: 25.1 %
Lymphocyte #: 1.9 x10 3/mm (ref 1.0–3.6)
MCH: 29.3 pg (ref 26.0–34.0)
MCHC: 33.2 g/dL (ref 32.0–36.0)
MCV: 88 fL (ref 80–100)
MONOS PCT: 7 %
Monocyte #: 0.5 x10 3/mm (ref 0.2–0.9)
Neutrophil #: 5.1 x10 3/mm (ref 1.4–6.5)
Neutrophil %: 66.6 %
PLATELETS: 145 x10 3/mm — AB (ref 150–440)
RBC: 4.15 10*6/uL (ref 3.80–5.20)
RDW: 14 % (ref 11.5–14.5)
WBC: 7.7 x10 3/mm (ref 3.6–11.0)

## 2015-02-14 DIAGNOSIS — C773 Secondary and unspecified malignant neoplasm of axilla and upper limb lymph nodes: Secondary | ICD-10-CM | POA: Diagnosis not present

## 2015-02-14 DIAGNOSIS — C50612 Malignant neoplasm of axillary tail of left female breast: Secondary | ICD-10-CM | POA: Diagnosis not present

## 2015-02-14 DIAGNOSIS — Z170 Estrogen receptor positive status [ER+]: Secondary | ICD-10-CM | POA: Diagnosis not present

## 2015-02-14 DIAGNOSIS — C7951 Secondary malignant neoplasm of bone: Secondary | ICD-10-CM | POA: Diagnosis not present

## 2015-02-15 ENCOUNTER — Ambulatory Visit
Admit: 2015-02-15 | Disposition: A | Discharge status: Home / Self Care | Payer: Self-pay | Attending: Oncology | Admitting: Oncology

## 2015-03-02 ENCOUNTER — Ambulatory Visit: Payer: Self-pay | Admitting: Oncology

## 2015-03-02 DIAGNOSIS — C7951 Secondary malignant neoplasm of bone: Secondary | ICD-10-CM | POA: Diagnosis not present

## 2015-03-02 DIAGNOSIS — C50912 Malignant neoplasm of unspecified site of left female breast: Secondary | ICD-10-CM | POA: Diagnosis not present

## 2015-03-14 LAB — CBC CANCER CENTER
BASOS ABS: 0 x10 3/mm (ref 0.0–0.1)
Basophil %: 1.4 %
Eosinophil #: 0 x10 3/mm (ref 0.0–0.7)
Eosinophil %: 1.7 %
HCT: 31.7 % — ABNORMAL LOW (ref 35.0–47.0)
HGB: 10.3 g/dL — ABNORMAL LOW (ref 12.0–16.0)
LYMPHS PCT: 70 %
Lymphocyte #: 1 x10 3/mm (ref 1.0–3.6)
MCH: 28.5 pg (ref 26.0–34.0)
MCHC: 32.5 g/dL (ref 32.0–36.0)
MCV: 88 fL (ref 80–100)
MONO ABS: 0.3 x10 3/mm (ref 0.2–0.9)
Monocyte %: 20.8 %
NEUTROS PCT: 6.1 %
Neutrophil #: 0.1 x10 3/mm — ABNORMAL LOW (ref 1.4–6.5)
Platelet: 188 x10 3/mm (ref 150–440)
RBC: 3.61 10*6/uL — ABNORMAL LOW (ref 3.80–5.20)
RDW: 17 % — AB (ref 11.5–14.5)
WBC: 1.4 x10 3/mm — CL (ref 3.6–11.0)

## 2015-03-18 ENCOUNTER — Ambulatory Visit
Admit: 2015-03-18 | Disposition: A | Discharge status: Home / Self Care | Payer: Self-pay | Attending: Oncology | Admitting: Oncology

## 2015-03-21 LAB — CBC CANCER CENTER
BASOS PCT: 0.8 %
Basophil #: 0.1 x10 3/mm (ref 0.0–0.1)
Eosinophil #: 0 x10 3/mm (ref 0.0–0.7)
Eosinophil %: 0.1 %
HCT: 34.3 % — AB (ref 35.0–47.0)
HGB: 11.2 g/dL — AB (ref 12.0–16.0)
Lymphocyte #: 1.8 x10 3/mm (ref 1.0–3.6)
Lymphocyte %: 21.9 %
MCH: 28.2 pg (ref 26.0–34.0)
MCHC: 32.7 g/dL (ref 32.0–36.0)
MCV: 86 fL (ref 80–100)
MONO ABS: 0.9 x10 3/mm (ref 0.2–0.9)
Monocyte %: 11.3 %
NEUTROS ABS: 5.3 x10 3/mm (ref 1.4–6.5)
NEUTROS PCT: 65.9 %
Platelet: 273 x10 3/mm (ref 150–440)
RBC: 3.99 10*6/uL (ref 3.80–5.20)
RDW: 17.2 % — ABNORMAL HIGH (ref 11.5–14.5)
WBC: 8.1 x10 3/mm (ref 3.6–11.0)

## 2015-03-28 LAB — CBC CANCER CENTER
BASOS PCT: 0.4 %
Basophil #: 0 x10 3/mm (ref 0.0–0.1)
EOS PCT: 0.6 %
Eosinophil #: 0.1 x10 3/mm (ref 0.0–0.7)
HCT: 34.3 % — ABNORMAL LOW (ref 35.0–47.0)
HGB: 11.1 g/dL — AB (ref 12.0–16.0)
LYMPHS PCT: 17 %
Lymphocyte #: 1.6 x10 3/mm (ref 1.0–3.6)
MCH: 28 pg (ref 26.0–34.0)
MCHC: 32.3 g/dL (ref 32.0–36.0)
MCV: 87 fL (ref 80–100)
MONO ABS: 0.6 x10 3/mm (ref 0.2–0.9)
MONOS PCT: 6.6 %
NEUTROS PCT: 75.4 %
Neutrophil #: 7 x10 3/mm — ABNORMAL HIGH (ref 1.4–6.5)
Platelet: 252 x10 3/mm (ref 150–440)
RBC: 3.96 10*6/uL (ref 3.80–5.20)
RDW: 18.3 % — ABNORMAL HIGH (ref 11.5–14.5)
WBC: 9.2 x10 3/mm (ref 3.6–11.0)

## 2015-03-28 LAB — COMPREHENSIVE METABOLIC PANEL
ALBUMIN: 3.6 g/dL
ALK PHOS: 85 U/L
ANION GAP: 9 (ref 7–16)
BILIRUBIN TOTAL: 0.6 mg/dL
BUN: 9 mg/dL
Calcium, Total: 8.6 mg/dL — ABNORMAL LOW
Chloride: 101 mmol/L
Co2: 28 mmol/L
Creatinine: 0.56 mg/dL
EGFR (Non-African Amer.): >60
Glucose: 173 mg/dL — ABNORMAL HIGH
Potassium: 3.3 mmol/L — ABNORMAL LOW
SGOT(AST): 43 U/L — ABNORMAL HIGH
SGPT (ALT): 26 U/L
Sodium: 138 mmol/L
Total Protein: 7.1 g/dL

## 2015-03-28 LAB — CREATININE, SERUM: CREATINE, SERUM: 0.56

## 2015-03-28 LAB — MAGNESIUM: MAGNESIUM: 1.5 mg/dL — AB

## 2015-04-01 ENCOUNTER — Encounter: Payer: Self-pay | Admitting: *Deleted

## 2015-04-04 LAB — CBC CANCER CENTER
Basophil #: 0 x10 3/mm (ref 0.0–0.1)
Basophil %: 1.6 %
EOS ABS: 0 x10 3/mm (ref 0.0–0.7)
Eosinophil %: 2.2 %
HCT: 30.8 % — ABNORMAL LOW (ref 35.0–47.0)
HGB: 10 g/dL — ABNORMAL LOW (ref 12.0–16.0)
LYMPHS ABS: 1.2 x10 3/mm (ref 1.0–3.6)
LYMPHS PCT: 72.5 %
MCH: 28.1 pg (ref 26.0–34.0)
MCHC: 32.4 g/dL (ref 32.0–36.0)
MCV: 87 fL (ref 80–100)
MONO ABS: 0.3 x10 3/mm (ref 0.2–0.9)
Monocyte %: 14.9 %
NEUTROS ABS: 0.1 x10 3/mm — AB (ref 1.4–6.5)
Neutrophil %: 8.8 %
PLATELETS: 187 x10 3/mm (ref 150–440)
RBC: 3.55 10*6/uL — ABNORMAL LOW (ref 3.80–5.20)
RDW: 18.5 % — AB (ref 11.5–14.5)
WBC: 1.7 x10 3/mm — CL (ref 3.6–11.0)

## 2015-04-06 ENCOUNTER — Other Ambulatory Visit: Payer: Self-pay | Admitting: Family Medicine

## 2015-04-11 ENCOUNTER — Other Ambulatory Visit: Payer: Self-pay | Admitting: Oncology

## 2015-04-11 LAB — CBC CANCER CENTER
BASOS ABS: 0 x10 3/mm (ref 0.0–0.1)
Basophil %: 0.5 %
EOS PCT: 0.3 %
Eosinophil #: 0 x10 3/mm (ref 0.0–0.7)
HCT: 34.1 % — ABNORMAL LOW (ref 35.0–47.0)
HGB: 11 g/dL — ABNORMAL LOW (ref 12.0–16.0)
LYMPHS PCT: 31.2 %
Lymphocyte #: 1.9 x10 3/mm (ref 1.0–3.6)
MCH: 27.5 pg (ref 26.0–34.0)
MCHC: 32.4 g/dL (ref 32.0–36.0)
MCV: 85 fL (ref 80–100)
Monocyte #: 0.8 x10 3/mm (ref 0.2–0.9)
Monocyte %: 13.5 %
NEUTROS PCT: 54.5 %
Neutrophil #: 3.3 x10 3/mm (ref 1.4–6.5)
Platelet: 240 x10 3/mm (ref 150–440)
RBC: 4.01 10*6/uL (ref 3.80–5.20)
RDW: 18.6 % — AB (ref 11.5–14.5)
WBC: 6.1 x10 3/mm (ref 3.6–11.0)

## 2015-04-13 ENCOUNTER — Other Ambulatory Visit: Payer: Self-pay

## 2015-04-13 DIAGNOSIS — C50919 Malignant neoplasm of unspecified site of unspecified female breast: Secondary | ICD-10-CM

## 2015-04-17 NOTE — Op Note (Signed)
PATIENT NAME:  Tammy Pope, Tammy Pope A MR#:  [id number] DATE OF BIRTH:  [birth year]  DATE OF PROCEDURE:  12/31/2014    PREOPERATIVE DIAGNOSIS: Metastatic breast cancer, need for central venous access.   POSTOPERATIVE DIAGNOSIS: Metastatic breast cancer, need for central venous access.  OPERATIVE PROCEDURE: Right subclavian Power Port placement with ultrasound and fluoroscopic guidance.   SURGEON: Hervey Ard, MD   ANESTHESIA: Attended local, 10 mL of 1% plain Xylocaine.   ESTIMATED BLOOD LOSS: Minimal.   CLINICAL NOTE: This 69 year old woman was recently identified with a left breast mass core biopsy showing evidence of a poorly differentiated adenocarcinoma. Recent PET scan shows evidence of widespread bony disease. She was felt to be a candidate for chemotherapy. Central venous access was requested by the treating oncologist.   OPERATIVE NOTE: With the patient under adequate sedation, the chest was prepped with ChloraPrep and draped.  In Trendelenburg position ultrasound was used to confirm patency of the subclavian vein. This was then cannulated under ultrasound guidance. The guidewire was passed, followed by the dilator. The catheter was positioned with its tip at the right atrial SVC junction. It was tunneled to a pocket on the right anterior chest. The port was anchored with 2-point fixation with 3-0 Prolene sutures. The wound was closed in layers with 3-0 Vicryl to the adipose tissue and a running 4-0 Vicryl subcuticular suture for the skin. The port easily irrigated and aspirated in the supine position.   Benzoin and Steri-Strips followed by Telfa and Tegaderm dressings were applied. Erect chest x-ray completed in the recovery room showed no evidence of pneumothorax and the catheter tip as described above.      ____________________________ Robert Bellow, MD jwb:DT D: 12/31/2014 14:28:16 ET T: 12/31/2014 15:16:12 ET JOB#: [id number]  cc: Robert Bellow, MD, <Dictator> Guadalupe Maple, MD Martie Lee. Oliva Bustard, MD   Mazal Ebey Amedeo Kinsman MD ELECTRONICALLY SIGNED 12/31/2014 17:23

## 2015-04-18 ENCOUNTER — Other Ambulatory Visit: Payer: BLUE CROSS/BLUE SHIELD | Admitting: Pharmacist

## 2015-04-18 ENCOUNTER — Other Ambulatory Visit: Payer: Self-pay | Admitting: *Deleted

## 2015-04-18 ENCOUNTER — Telehealth: Payer: Self-pay | Admitting: Pharmacist

## 2015-04-18 ENCOUNTER — Inpatient Hospital Stay: Payer: Medicare Other | Attending: Oncology | Admitting: Oncology

## 2015-04-18 ENCOUNTER — Inpatient Hospital Stay: Payer: Medicare Other | Admitting: *Deleted

## 2015-04-18 DIAGNOSIS — C50612 Malignant neoplasm of axillary tail of left female breast: Secondary | ICD-10-CM | POA: Diagnosis not present

## 2015-04-18 DIAGNOSIS — Z90710 Acquired absence of both cervix and uterus: Secondary | ICD-10-CM

## 2015-04-18 DIAGNOSIS — Z79810 Long term (current) use of selective estrogen receptor modulators (SERMs): Secondary | ICD-10-CM | POA: Diagnosis not present

## 2015-04-18 DIAGNOSIS — E876 Hypokalemia: Secondary | ICD-10-CM | POA: Insufficient documentation

## 2015-04-18 DIAGNOSIS — Z5111 Encounter for antineoplastic chemotherapy: Secondary | ICD-10-CM | POA: Insufficient documentation

## 2015-04-18 DIAGNOSIS — Z79899 Other long term (current) drug therapy: Secondary | ICD-10-CM | POA: Diagnosis not present

## 2015-04-18 DIAGNOSIS — Z9049 Acquired absence of other specified parts of digestive tract: Secondary | ICD-10-CM

## 2015-04-18 DIAGNOSIS — I10 Essential (primary) hypertension: Secondary | ICD-10-CM

## 2015-04-18 DIAGNOSIS — C50919 Malignant neoplasm of unspecified site of unspecified female breast: Secondary | ICD-10-CM

## 2015-04-18 DIAGNOSIS — Z170 Estrogen receptor positive status [ER+]: Secondary | ICD-10-CM

## 2015-04-18 DIAGNOSIS — R918 Other nonspecific abnormal finding of lung field: Secondary | ICD-10-CM

## 2015-04-18 DIAGNOSIS — E785 Hyperlipidemia, unspecified: Secondary | ICD-10-CM

## 2015-04-18 LAB — COMPREHENSIVE METABOLIC PANEL
ALT: 28 U/L (ref 14–54)
AST: 45 U/L — AB (ref 15–41)
Albumin: 3.6 g/dL (ref 3.5–5.0)
Alkaline Phosphatase: 71 U/L (ref 38–126)
Anion gap: 7 (ref 5–15)
BILIRUBIN TOTAL: 0.5 mg/dL (ref 0.3–1.2)
BUN: 8 mg/dL (ref 6–20)
CALCIUM: 8.3 mg/dL — AB (ref 8.9–10.3)
CO2: 29 mmol/L (ref 22–32)
Chloride: 101 mmol/L (ref 101–111)
Creatinine, Ser: 0.55 mg/dL (ref 0.44–1.00)
GFR calc Af Amer: >60 mL/min (ref >60)
GFR calc non Af Amer: >60 mL/min (ref >60)
Glucose, Bld: 160 mg/dL — ABNORMAL HIGH (ref 65–99)
Potassium: 3 mmol/L — ABNORMAL LOW (ref 3.5–5.1)
SODIUM: 137 mmol/L (ref 135–145)
TOTAL PROTEIN: 7 g/dL (ref 6.5–8.1)

## 2015-04-18 LAB — CBC WITH DIFFERENTIAL/PLATELET
BASOS ABS: 0 10*3/uL (ref 0–0.1)
Basophils Relative: 0 %
Eosinophils Absolute: 0 10*3/uL (ref 0–0.7)
HEMATOCRIT: 33.8 % — AB (ref 35.0–47.0)
Hemoglobin: 10.9 g/dL — ABNORMAL LOW (ref 12.0–16.0)
LYMPHS ABS: 1.6 10*3/uL (ref 1.0–3.6)
MCH: 27.9 pg (ref 26.0–34.0)
MCHC: 32.1 g/dL (ref 32.0–36.0)
MCV: 86.8 fL (ref 80.0–100.0)
Monocytes Absolute: 0.8 10*3/uL (ref 0.2–0.9)
Neutro Abs: 7.4 10*3/uL — ABNORMAL HIGH (ref 1.4–6.5)
PLATELETS: 229 10*3/uL (ref 150–440)
RBC: 3.9 MIL/uL (ref 3.80–5.20)
RDW: 19 % — ABNORMAL HIGH (ref 11.5–14.5)
WBC: 9.9 10*3/uL (ref 3.6–11.0)

## 2015-04-18 MED ORDER — HEPARIN SOD (PORK) LOCK FLUSH 100 UNIT/ML IV SOLN
500.0000 [IU] | Freq: Once | INTRAVENOUS | Status: AC | PRN
  Administered 2015-04-18: 500 [IU]
  Filled 2015-04-18 (×2): qty 5

## 2015-04-18 MED ORDER — SODIUM CHLORIDE 0.9 % IV SOLN
Freq: Once | INTRAVENOUS | Status: AC
  Administered 2015-04-18: 12:00:00 via INTRAVENOUS
  Filled 2015-04-18: qty 5

## 2015-04-18 MED ORDER — DIPHENHYDRAMINE HCL 50 MG/ML IJ SOLN
12.5000 mg | Freq: Once | INTRAMUSCULAR | Status: AC
  Administered 2015-04-18: 12.5 mg via INTRAVENOUS

## 2015-04-18 MED ORDER — PALONOSETRON HCL INJECTION 0.25 MG/5ML
0.2500 mg | Freq: Once | INTRAVENOUS | Status: AC
  Administered 2015-04-18: 0.25 mg via INTRAVENOUS
  Filled 2015-04-18: qty 5

## 2015-04-18 MED ORDER — MAGNESIUM SULFATE 2 GM/50ML IV SOLN
2.0000 g | Freq: Once | INTRAVENOUS | Status: AC
  Administered 2015-04-18: 2 g via INTRAVENOUS

## 2015-04-18 MED ORDER — SODIUM CHLORIDE 0.9 % IV SOLN
Freq: Once | INTRAVENOUS | Status: AC
  Administered 2015-04-18: 12:00:00 via INTRAVENOUS
  Filled 2015-04-18: qty 250

## 2015-04-18 MED ORDER — SODIUM CHLORIDE 0.9 % IJ SOLN
10.0000 mL | INTRAMUSCULAR | Status: AC | PRN
  Administered 2015-04-18: 10 mL
  Filled 2015-04-18: qty 10

## 2015-04-18 MED ORDER — DOCETAXEL CHEMO INJECTION 160 MG/16ML
75.0000 mg/m2 | Freq: Once | INTRAVENOUS | Status: AC
  Administered 2015-04-18: 130 mg via INTRAVENOUS
  Filled 2015-04-18: qty 13

## 2015-04-18 MED ORDER — DIPHENHYDRAMINE HCL 50 MG/ML IJ SOLN
INTRAMUSCULAR | Status: AC
  Filled 2015-04-18: qty 1

## 2015-04-18 NOTE — Telephone Encounter (Signed)
Spoke with Dr. Oliva Bustard regarding Magnesium = 1.5. Verbal order for Magnesium 2gm IVPB once.

## 2015-04-18 NOTE — Patient Instructions (Signed)
Paclitaxel injection What is this medicine? PACLITAXEL (PAK li TAX el) is a chemotherapy drug. It targets fast dividing cells, like cancer cells, and causes these cells to die. This medicine is used to treat ovarian cancer, breast cancer, and other cancers. This medicine may be used for other purposes; ask your health care provider or pharmacist if you have questions. COMMON BRAND NAME(S): Onxol, Taxol What should I tell my health care provider before I take this medicine? They need to know if you have any of these conditions: -blood disorders -irregular heartbeat -infection (especially a virus infection such as chickenpox, cold sores, or herpes) -liver disease -previous or ongoing radiation therapy -an unusual or allergic reaction to paclitaxel, alcohol, polyoxyethylated castor oil, other chemotherapy agents, other medicines, foods, dyes, or preservatives -pregnant or trying to get pregnant -breast-feeding How should I use this medicine? This drug is given as an infusion into a vein. It is administered in a hospital or clinic by a specially trained health care professional. Talk to your pediatrician regarding the use of this medicine in children. Special care may be needed. Overdosage: If you think you have taken too much of this medicine contact a poison control center or emergency room at once. NOTE: This medicine is only for you. Do not share this medicine with others. What if I miss a dose? It is important not to miss your dose. Call your doctor or health care professional if you are unable to keep an appointment. What may interact with this medicine? Do not take this medicine with any of the following medications: -disulfiram -metronidazole This medicine may also interact with the following medications: -cyclosporine -diazepam -ketoconazole -medicines to increase blood counts like filgrastim, pegfilgrastim, sargramostim -other chemotherapy drugs like cisplatin, doxorubicin,  epirubicin, etoposide, teniposide, vincristine -quinidine -testosterone -vaccines -verapamil Talk to your doctor or health care professional before taking any of these medicines: -acetaminophen -aspirin -ibuprofen -ketoprofen -naproxen This list may not describe all possible interactions. Give your health care provider a list of all the medicines, herbs, non-prescription drugs, or dietary supplements you use. Also tell them if you smoke, drink alcohol, or use illegal drugs. Some items may interact with your medicine. What should I watch for while using this medicine? Your condition will be monitored carefully while you are receiving this medicine. You will need important blood work done while you are taking this medicine. This drug may make you feel generally unwell. This is not uncommon, as chemotherapy can affect healthy cells as well as cancer cells. Report any side effects. Continue your course of treatment even though you feel ill unless your doctor tells you to stop. In some cases, you may be given additional medicines to help with side effects. Follow all directions for their use. Call your doctor or health care professional for advice if you get a fever, chills or sore throat, or other symptoms of a cold or flu. Do not treat yourself. This drug decreases your body's ability to fight infections. Try to avoid being around people who are sick. This medicine may increase your risk to bruise or bleed. Call your doctor or health care professional if you notice any unusual bleeding. Be careful brushing and flossing your teeth or using a toothpick because you may get an infection or bleed more easily. If you have any dental work done, tell your dentist you are receiving this medicine. Avoid taking products that contain aspirin, acetaminophen, ibuprofen, naproxen, or ketoprofen unless instructed by your doctor. These medicines may hide a fever.   Do not become pregnant while taking this medicine.  Women should inform their doctor if they wish to become pregnant or think they might be pregnant. There is a potential for serious side effects to an unborn child. Talk to your health care professional or pharmacist for more information. Do not breast-feed an infant while taking this medicine. Men are advised not to father a child while receiving this medicine. What side effects may I notice from receiving this medicine? Side effects that you should report to your doctor or health care professional as soon as possible: -allergic reactions like skin rash, itching or hives, swelling of the face, lips, or tongue -low blood counts - This drug may decrease the number of white blood cells, red blood cells and platelets. You may be at increased risk for infections and bleeding. -signs of infection - fever or chills, cough, sore throat, pain or difficulty passing urine -signs of decreased platelets or bleeding - bruising, pinpoint red spots on the skin, black, tarry stools, nosebleeds -signs of decreased red blood cells - unusually weak or tired, fainting spells, lightheadedness -breathing problems -chest pain -high or low blood pressure -mouth sores -nausea and vomiting -pain, swelling, redness or irritation at the injection site -pain, tingling, numbness in the hands or feet -slow or irregular heartbeat -swelling of the ankle, feet, hands Side effects that usually do not require medical attention (report to your doctor or health care professional if they continue or are bothersome): -bone pain -complete hair loss including hair on your head, underarms, pubic hair, eyebrows, and eyelashes -changes in the color of fingernails -diarrhea -loosening of the fingernails -loss of appetite -muscle or joint pain -red flush to skin -sweating This list may not describe all possible side effects. Call your doctor for medical advice about side effects. You may report side effects to FDA at  1-800-FDA-1088. Where should I keep my medicine? This drug is given in a hospital or clinic and will not be stored at home. NOTE: This sheet is a summary. It may not cover all possible information. If you have questions about this medicine, talk to your doctor, pharmacist, or health care provider.  2015, [employer]vier/Gold Standard. (2013-01-26 16:41:21)  

## 2015-04-19 ENCOUNTER — Other Ambulatory Visit: Payer: Self-pay | Admitting: *Deleted

## 2015-04-20 ENCOUNTER — Telehealth: Payer: Self-pay | Admitting: *Deleted

## 2015-04-20 MED ORDER — FLUCONAZOLE 100 MG PO TABS: 100.0000 mg | ORAL_TABLET | Freq: Every day | ORAL | Status: DC

## 2015-04-20 NOTE — Telephone Encounter (Signed)
Starting this morning she is having "bleeding from the front and I have had a hysterectomy" when asked the color she states it isn't dark, more pink colored. On her panties and sometimes when she wipes. Denies any burning with urination or frequency. Denies any pain, but expresses concern over having this occur

## 2015-04-20 NOTE — Telephone Encounter (Signed)
Diflucan 100 mg daily for 5 days Keep appt for tomorrow. Pt informed of above

## 2015-04-21 ENCOUNTER — Inpatient Hospital Stay: Payer: Medicare Other

## 2015-04-21 ENCOUNTER — Other Ambulatory Visit: Payer: Self-pay | Admitting: Oncology

## 2015-04-21 VITALS — BP 136/84 | HR 120 | Temp 97.0°F | Resp 18

## 2015-04-21 DIAGNOSIS — Z170 Estrogen receptor positive status [ER+]: Secondary | ICD-10-CM | POA: Diagnosis not present

## 2015-04-21 DIAGNOSIS — Z5111 Encounter for antineoplastic chemotherapy: Secondary | ICD-10-CM | POA: Diagnosis not present

## 2015-04-21 DIAGNOSIS — C50919 Malignant neoplasm of unspecified site of unspecified female breast: Secondary | ICD-10-CM

## 2015-04-21 DIAGNOSIS — E876 Hypokalemia: Secondary | ICD-10-CM | POA: Diagnosis not present

## 2015-04-21 DIAGNOSIS — R918 Other nonspecific abnormal finding of lung field: Secondary | ICD-10-CM | POA: Diagnosis not present

## 2015-04-21 DIAGNOSIS — Z79810 Long term (current) use of selective estrogen receptor modulators (SERMs): Secondary | ICD-10-CM | POA: Diagnosis not present

## 2015-04-21 DIAGNOSIS — C50612 Malignant neoplasm of axillary tail of left female breast: Secondary | ICD-10-CM | POA: Diagnosis not present

## 2015-04-21 MED ORDER — DIPHENHYDRAMINE HCL 25 MG PO CAPS
50.0000 mg | ORAL_CAPSULE | Freq: Once | ORAL | Status: AC
  Administered 2015-04-21: 50 mg via ORAL
  Filled 2015-04-21: qty 2

## 2015-04-21 MED ORDER — ACETAMINOPHEN 325 MG PO TABS
650.0000 mg | ORAL_TABLET | Freq: Once | ORAL | Status: AC
  Administered 2015-04-21: 650 mg via ORAL
  Filled 2015-04-21: qty 2

## 2015-04-21 MED ORDER — SODIUM CHLORIDE 0.9 % IV SOLN
Freq: Once | INTRAVENOUS | Status: AC
  Administered 2015-04-21: 15:00:00 via INTRAVENOUS
  Filled 2015-04-21: qty 250

## 2015-04-21 MED ORDER — SODIUM CHLORIDE 0.9 % IJ SOLN
10.0000 mL | INTRAMUSCULAR | Status: DC | PRN
  Filled 2015-04-21: qty 10

## 2015-04-21 MED ORDER — TRASTUZUMAB CHEMO INJECTION 440 MG
6.0000 mg/kg | Freq: Once | INTRAVENOUS | Status: AC
  Administered 2015-04-21: 399 mg via INTRAVENOUS
  Filled 2015-04-21: qty 19

## 2015-04-21 MED ORDER — SODIUM CHLORIDE 0.9 % IV SOLN
420.0000 mg | Freq: Once | INTRAVENOUS | Status: AC
  Administered 2015-04-21: 420 mg via INTRAVENOUS
  Filled 2015-04-21: qty 14

## 2015-04-21 MED ORDER — HEPARIN SOD (PORK) LOCK FLUSH 100 UNIT/ML IV SOLN
500.0000 [IU] | Freq: Once | INTRAVENOUS | Status: AC | PRN
  Administered 2015-04-21: 500 [IU]
  Filled 2015-04-21: qty 5

## 2015-04-25 ENCOUNTER — Inpatient Hospital Stay: Payer: Medicare Other

## 2015-04-25 DIAGNOSIS — Z5111 Encounter for antineoplastic chemotherapy: Secondary | ICD-10-CM | POA: Diagnosis not present

## 2015-04-25 DIAGNOSIS — Z79810 Long term (current) use of selective estrogen receptor modulators (SERMs): Secondary | ICD-10-CM | POA: Diagnosis not present

## 2015-04-25 DIAGNOSIS — C50612 Malignant neoplasm of axillary tail of left female breast: Secondary | ICD-10-CM | POA: Diagnosis not present

## 2015-04-25 DIAGNOSIS — E876 Hypokalemia: Secondary | ICD-10-CM | POA: Diagnosis not present

## 2015-04-25 DIAGNOSIS — Z170 Estrogen receptor positive status [ER+]: Secondary | ICD-10-CM | POA: Diagnosis not present

## 2015-04-25 DIAGNOSIS — R918 Other nonspecific abnormal finding of lung field: Secondary | ICD-10-CM | POA: Diagnosis not present

## 2015-04-25 DIAGNOSIS — C50919 Malignant neoplasm of unspecified site of unspecified female breast: Secondary | ICD-10-CM

## 2015-04-27 LAB — CANCER ANTIGEN 27.29: CA 27.29: 66.2 U/mL — ABNORMAL HIGH (ref 0.0–38.6)

## 2015-04-29 ENCOUNTER — Telehealth: Payer: Self-pay | Admitting: *Deleted

## 2015-04-29 NOTE — Telephone Encounter (Signed)
Called to say her throat feels full, not sore, but full like there is phlegm in it. Does not feel bad, no fever. Advised to take claritin or Zyrtec and call back if it does not improve

## 2015-04-30 ENCOUNTER — Encounter: Payer: Self-pay | Admitting: Oncology

## 2015-04-30 NOTE — Progress Notes (Signed)
[place] @ [facility] [phone]  Fax:([phone]) [place]: Dec 31, 1945  MR#: [id number]  FUX#:[id number]  Patient Care Team: Guadalupe Maple, MD as PCP - General (Family Medicine) Robert Bellow, MD (General Surgery)  CHIEF COMPLAINT:  Chief Complaint  Patient presents with  . 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  Estrogen receptor 90%.  Progesterone receptor 60%.  Patient had lumpectomy and MammoSite partial breast radiation therapy followed by tamoxifen for 5 years.  Patient is still taking tamoxifen.. 2 recent mammogram in December of 2015 negative.  Further evaluation by surgeon revealed a palpable mass in the left upper outer quadrant near axilla.  Ultrasound revealed one small normal-appearing lymph node 0.6 cm in lower limit of axilla.  Adjacent to this there was ill-defined multilobulated hypoechoic mass 1.8 x 2.6 cm.  Biopsy of the left axillary mass was done which was positive for high-grade carcinoma.hormone receptors are pending  2,estrogen receptor positive.  Progesterone receptor positive.  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 3.  MRI scan of breast (January, 2015) reveals multiple abnormal enlarged left axillary lymph node There are 2 oval some circumscribed mass at 2:30 and 3:00 position of the left breast.  0.6 cm x 1.3 cm and 4 x 7 mm Non-mass enhancement extending 2.4 cm these masses is 3.6 cm mass.  Findings suggestive of several pulmonary nodules.  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  Overall feeling better.  Appetite is improving.  No nausea no vomiting no diarrhea.  Bony pains have improved.  REVIEW OF SYSTEMS:   GENERAL:  Feels good.  Active.  No fevers, sweats or weight loss. PERFORMANCE STATUS (ECOG)01 HEENT:  No visual changes, runny nose, sore throat, mouth sores or tenderness. Lungs: No shortness of breath or cough.  No hemoptysis. Cardiac:  No chest pain, palpitations, orthopnea, or PND. GI:  No nausea, vomiting, diarrhea, constipation, melena or hematochezia. GU:  No urgency, frequency, dysuria, or hematuria. Musculoskeletal:  No back pain.  No joint pain.  No muscle tenderness. Extremities:  No pain or swelling. Skin:  No rashes or skin changes. Neuro:  No headache, numbness or weakness, balance or coordination issues. Endocrine:  No diabetes, thyroid issues, hot flashes or night sweats. Psych:  No mood changes, depression or anxiety. Pain:  No focal pain. Review of systems:  All other systems reviewed and found to be negative.  As per HPI. Otherwise, a complete review of systems is negatve.  PAST MEDICAL HISTORY: Past Medical History  Diagnosis Date  . Hypertension 2009  . 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 Special screening for malignant neoplasms, colon   . Hyperlipidemia 1990  . Floaters 2014  . Cancer 2010    right breast ductal carcinoma in situ  . 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 ER 90%, PR 50%, MammoSite  partial breast radiation  . Breast cancer 02/21/2009  . 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 Appendectomy  1956  . Partial hysterectomy  1977  . Colonoscopy  2008    ??? Md  . Breast surgery Right 2010    wide excision  . Breast biopsy Right 2010    stereo  . Left axilla biopsy Left 12-14-14    FAMILY HISTORY Family History  Problem Relation Age of Onset  . Cancer Other     unknown family members with ovarian,colon,breast cancers    GYNECOLOGIC HISTORY:  No LMP recorded. Patient has had a hysterectomy.     ADVANCED DIRECTIVES: Patient does not have advance care directive ,an issue has been discussed.  HEALTH MAINTENANCE: History  Substance Use Topics  . Smoking status: Never Smoker   . Smokeless tobacco: Not on file  . Alcohol Use: No     Colonoscopy:  PAP:  Bone density:  Lipid panel:  Allergies  Allergen Reactions  . Penicillin G     Other reaction(s): Pruritic rash  . Sulfa Antibiotics Rash    Other reaction(s): Weal    Current Outpatient Prescriptions  Medication Sig Dispense Refill  . ALPRAZolam (XANAX) 0.5 MG tablet TAKE 1 TABLET BY MOUTH EVERY 8 HOURS AS NEEDED FOR ANXIETY 30 tablet 0  . aspirin 81 MG tablet Take 81 mg by mouth daily.    . CRESTOR 5 MG tablet Take 5 mg by mouth daily at 6 PM.     . metFORMIN (GLUCOPHAGE) 500 MG tablet Take 500 mg by mouth 2 (two) times daily with a meal.     . ONE TOUCH ULTRA TEST test strip     . tamoxifen (NOLVADEX) 20 MG tablet TAKE 1 TABLET BY MOUTH EVERY DAY 90 tablet 3  . valsartan-hydrochlorothiazide (DIOVAN-HCT) 320-25 MG per tablet Take 1 tablet by mouth daily.     Marland Kitchen aspirin 81 MG tablet Take by mouth.    . fluconazole (DIFLUCAN) 100 MG tablet Take 1 tablet (100 mg total) by mouth daily. 5 tablet 0  . metFORMIN (GLUCOPHAGE) 500 MG tablet Take by mouth.    . rosuvastatin (CRESTOR) 5 MG tablet Take by  mouth.    . valsartan-hydrochlorothiazide (DIOVAN-HCT) 320-25 MG per tablet Take by mouth.     No current facility-administered medications for this visit.   Facility-Administered Medications Ordered in Other Visits  Medication Dose Route Frequency Provider Last Rate Last Dose  . sodium chloride 0.9 % injection 10 mL  10 mL Intracatheter PRN Forest Gleason, MD   10 mL at 04/18/15 1545    OBJECTIVE:  Filed Vitals:   04/18/15 1003  BP: 121/77  Pulse: 91  Temp: 96.2 F (35.7 C)     Body mass index is 27.18 kg/(m^2).    ECOG FS:1 - Symptomatic but completely ambulatory  PHYSICAL EXAM:GENERAL:  Well developed, well nourished, sitting comfortably in the exam room in no acute distress. MENTAL STATUS:  Alert and oriented to person, place and time. HEAD:  alopecia Normocephalic,  atraumatic, face symmetric, no Cushingoid features. EYES:  *.  Pupils equal round and reactive to light and accomodation.  No conjunctivitis or scleral icterus. ENT:  Oropharynx clear without lesion.  Tongue normal. Mucous membranes moist.  RESPIRATORY:  Clear to auscultation without rales, wheezes or rhonchi. CARDIOVASCULAR:  Regular rate and rhythm without murmur, rub or gallop. BREAST:  Right breast without masses, skin changes or nipple discharge.  Left breast without masses, skin changes or nipple discharge. ABDOMEN:  Soft, non-tender, with active bowel sounds, and no hepatosplenomegaly.  No masses. BACK:  No CVA tenderness.  No tenderness on percussion of the back or rib cage. SKIN:  No rashes, ulcers or lesions. EXTREMITIES: No edema, no skin discoloration or tenderness.  No palpable cords. LYMPH NODES: No palpable cervical, supraclavicular, axillary or inguinal adenopathy  NEUROLOGICAL: Unremarkable. PSYCH:  Appropriate.   LAB RESULTS:  Office Visit on 04/18/2015  Component Date Value Ref Range Status  . WBC 04/18/2015 9.9  3.6 - 11.0 K/uL Final  . RBC 04/18/2015 3.90  3.80 - 5.20 MIL/uL Final  .  Hemoglobin 04/18/2015 10.9* 12.0 - 16.0 g/dL Final  . HCT 04/18/2015 33.8* 35.0 - 47.0 % Final  . MCV 04/18/2015 86.8  80.0 - 100.0 fL Final  . MCH 04/18/2015 27.9  26.0 - 34.0 pg Final  . MCHC 04/18/2015 32.1  32.0 - 36.0 g/dL Final  . RDW 04/18/2015 19.0* 11.5 - 14.5 % Final  . Platelets 04/18/2015 229  150 - 440 K/uL Final  . Neutrophils Relative % 04/18/2015 76%   Final  . Neutro Abs 04/18/2015 7.4* 1.4 - 6.5 K/uL Final  . Lymphocytes Relative 04/18/2015 16%   Final  . Lymphs Abs 04/18/2015 1.6  1.0 - 3.6 K/uL Final  . Monocytes Relative 04/18/2015 8%   Final  . Monocytes Absolute 04/18/2015 0.8  0.2 - 0.9 K/uL Final  . Eosinophils Relative 04/18/2015 0%   Final  . Eosinophils Absolute 04/18/2015 0.0  0 - 0.7 K/uL Final  . Basophils Relative 04/18/2015 0%   Final  . Basophils Absolute 04/18/2015 0.0  0 - 0.1 K/uL Final  . Sodium 04/18/2015 137  135 - 145 mmol/L Final  . Potassium 04/18/2015 3.0* 3.5 - 5.1 mmol/L Final  . Chloride 04/18/2015 101  101 - 111 mmol/L Final  . CO2 04/18/2015 29  22 - 32 mmol/L Final  . Glucose, Bld 04/18/2015 160* 65 - 99 mg/dL Final  . BUN 04/18/2015 8  6 - 20 mg/dL Final  . Creatinine, Ser 04/18/2015 0.55  0.44 - 1.00 mg/dL Final  . Calcium 04/18/2015 8.3* 8.9 - 10.3 mg/dL Final  . Total Protein 04/18/2015 7.0  6.5 - 8.1 g/dL Final  . Albumin 04/18/2015 3.6  3.5 - 5.0 g/dL Final  . AST 04/18/2015 45* 15 - 41 U/L Final  . ALT 04/18/2015 28  14 - 54 U/L Final  . Alkaline Phosphatase 04/18/2015 71  38 - 126 U/L Final  . Total Bilirubin 04/18/2015 0.5  0.3 - 1.2 mg/dL Final  . GFR calc non Af Amer 04/18/2015 >60  >60 mL/min Final  . GFR calc Af Amer 04/18/2015 >60  >60 mL/min Final   Comment: (NOTE) The eGFR has been calculated using the CKD EPI equation. This calculation has not been validated in all clinical situations. eGFR's persistently <90 mL/min signify possible Chronic Kidney Disease.   . Anion gap 04/18/2015 7  5 - 15 Final    Lab  Results  Component Value Date  LABCA2 66.2* 04/25/2015   No results found for: CA199 Lab Results  Component Value Date   CEA 2.2 12/16/2014   No results found for: PSA No results found for: CA125   STUDIES: No results found.  ASSESSMENT: This is a 6 chemotherapy cycle on the patient.  After this treatment patient will be reevaluated with another PET scan or reassessment of the tumor Possibility of local therapy would be considered if needed  MEDICAL DECISION MAKING:  All lab data has been reviewed Continue chemotherapy with Taxotere PERJETA and Herceptin Continue Xgeva After reevaluation with PET scan consideration of local therapy patient has a significant response or consideration off letrozole and IBRANCE as maintenance therapy  Patient expressed understanding and was in agreement with this plan. She also understands that She can call clinic at any time with any questions, concerns, or complaints.    Breast cancer   Staging form: Breast, AJCC 7th Edition     Clinical stage from 04/06/2015: Stage IV (T2, N2, M1) - Signed by Evlyn Kanner, NP on 04/06/2015   Forest Gleason, MD   04/30/2015 4:09 PM

## 2015-05-02 ENCOUNTER — Other Ambulatory Visit: Payer: Self-pay | Admitting: *Deleted

## 2015-05-02 ENCOUNTER — Inpatient Hospital Stay: Payer: Medicare Other

## 2015-05-02 ENCOUNTER — Telehealth: Payer: Self-pay | Admitting: *Deleted

## 2015-05-02 DIAGNOSIS — Z5111 Encounter for antineoplastic chemotherapy: Secondary | ICD-10-CM | POA: Diagnosis not present

## 2015-05-02 DIAGNOSIS — E876 Hypokalemia: Secondary | ICD-10-CM

## 2015-05-02 DIAGNOSIS — Z170 Estrogen receptor positive status [ER+]: Secondary | ICD-10-CM | POA: Diagnosis not present

## 2015-05-02 DIAGNOSIS — C50919 Malignant neoplasm of unspecified site of unspecified female breast: Secondary | ICD-10-CM

## 2015-05-02 DIAGNOSIS — Z79810 Long term (current) use of selective estrogen receptor modulators (SERMs): Secondary | ICD-10-CM | POA: Diagnosis not present

## 2015-05-02 DIAGNOSIS — R918 Other nonspecific abnormal finding of lung field: Secondary | ICD-10-CM | POA: Diagnosis not present

## 2015-05-02 DIAGNOSIS — C50612 Malignant neoplasm of axillary tail of left female breast: Secondary | ICD-10-CM | POA: Diagnosis not present

## 2015-05-02 LAB — CBC WITH DIFFERENTIAL/PLATELET
BASOS ABS: 0.1 10*3/uL (ref 0–0.1)
Basophils Relative: 1 %
EOS ABS: 0 10*3/uL (ref 0–0.7)
EOS PCT: 0 %
HEMATOCRIT: 35.1 % (ref 35.0–47.0)
HEMOGLOBIN: 11.3 g/dL — AB (ref 12.0–16.0)
LYMPHS ABS: 1.8 10*3/uL (ref 1.0–3.6)
LYMPHS PCT: 20 %
MCH: 27.1 pg (ref 26.0–34.0)
MCHC: 32.2 g/dL (ref 32.0–36.0)
MCV: 84.2 fL (ref 80.0–100.0)
Monocytes Absolute: 0.8 10*3/uL (ref 0.2–0.9)
Monocytes Relative: 9 %
NEUTROS ABS: 6.2 10*3/uL (ref 1.4–6.5)
Neutrophils Relative %: 70 %
PLATELETS: 263 10*3/uL (ref 150–440)
RBC: 4.17 MIL/uL (ref 3.80–5.20)
RDW: 19.3 % — ABNORMAL HIGH (ref 11.5–14.5)
WBC: 8.8 10*3/uL (ref 3.6–11.0)

## 2015-05-02 LAB — COMPREHENSIVE METABOLIC PANEL
ALT: 24 U/L (ref 14–54)
AST: 34 U/L (ref 15–41)
Albumin: 3.8 g/dL (ref 3.5–5.0)
Alkaline Phosphatase: 95 U/L (ref 38–126)
Anion gap: 9 (ref 5–15)
BUN: 10 mg/dL (ref 6–20)
CALCIUM: 8.9 mg/dL (ref 8.9–10.3)
CHLORIDE: 101 mmol/L (ref 101–111)
CO2: 29 mmol/L (ref 22–32)
CREATININE: 0.54 mg/dL (ref 0.44–1.00)
GFR calc Af Amer: >60 mL/min (ref >60)
GFR calc non Af Amer: >60 mL/min (ref >60)
GLUCOSE: 169 mg/dL — AB (ref 65–99)
Potassium: 2.5 mmol/L — CL (ref 3.5–5.1)
Sodium: 139 mmol/L (ref 135–145)
Total Bilirubin: 0.4 mg/dL (ref 0.3–1.2)
Total Protein: 7.3 g/dL (ref 6.5–8.1)

## 2015-05-02 MED ORDER — POTASSIUM CHLORIDE ER 20 MEQ PO TBCR: 40.0000 meq | EXTENDED_RELEASE_TABLET | Freq: Every day | ORAL | Status: DC

## 2015-05-02 MED ORDER — SODIUM CHLORIDE 0.9 % IV SOLN: 40.0000 meq | Freq: Once | INTRAVENOUS | Status: DC

## 2015-05-02 NOTE — Telephone Encounter (Signed)
Potassium 2.5  Called result to Dr Oliva Bustard who stated he will make plan with [facility]

## 2015-05-02 NOTE — Telephone Encounter (Signed)
Pt scheduled and agreed on time to come in tomorrow for Pot inf and see NP

## 2015-05-03 ENCOUNTER — Inpatient Hospital Stay (HOSPITAL_BASED_OUTPATIENT_CLINIC_OR_DEPARTMENT_OTHER): Payer: Medicare Other | Admitting: Family Medicine

## 2015-05-03 ENCOUNTER — Inpatient Hospital Stay: Payer: Medicare Other

## 2015-05-03 ENCOUNTER — Encounter: Payer: Self-pay | Admitting: Family Medicine

## 2015-05-03 ENCOUNTER — Other Ambulatory Visit: Payer: Self-pay | Admitting: *Deleted

## 2015-05-03 VITALS — BP 122/70 | HR 105 | Temp 95.1°F | Wt 146.2 lb

## 2015-05-03 DIAGNOSIS — C50612 Malignant neoplasm of axillary tail of left female breast: Secondary | ICD-10-CM | POA: Diagnosis not present

## 2015-05-03 DIAGNOSIS — Z79899 Other long term (current) drug therapy: Secondary | ICD-10-CM

## 2015-05-03 DIAGNOSIS — Z170 Estrogen receptor positive status [ER+]: Secondary | ICD-10-CM | POA: Diagnosis not present

## 2015-05-03 DIAGNOSIS — C50619 Malignant neoplasm of axillary tail of unspecified female breast: Secondary | ICD-10-CM

## 2015-05-03 DIAGNOSIS — Z79810 Long term (current) use of selective estrogen receptor modulators (SERMs): Secondary | ICD-10-CM | POA: Diagnosis not present

## 2015-05-03 DIAGNOSIS — E785 Hyperlipidemia, unspecified: Secondary | ICD-10-CM

## 2015-05-03 DIAGNOSIS — Z5111 Encounter for antineoplastic chemotherapy: Secondary | ICD-10-CM | POA: Diagnosis not present

## 2015-05-03 DIAGNOSIS — E876 Hypokalemia: Secondary | ICD-10-CM | POA: Diagnosis not present

## 2015-05-03 DIAGNOSIS — Z9049 Acquired absence of other specified parts of digestive tract: Secondary | ICD-10-CM

## 2015-05-03 DIAGNOSIS — Z90710 Acquired absence of both cervix and uterus: Secondary | ICD-10-CM

## 2015-05-03 DIAGNOSIS — I10 Essential (primary) hypertension: Secondary | ICD-10-CM

## 2015-05-03 DIAGNOSIS — R918 Other nonspecific abnormal finding of lung field: Secondary | ICD-10-CM

## 2015-05-03 LAB — MAGNESIUM: Magnesium: 1.5 mg/dL — ABNORMAL LOW (ref 1.7–2.4)

## 2015-05-03 MED ORDER — SODIUM CHLORIDE 0.9 % IV SOLN
INTRAVENOUS | Status: AC
  Administered 2015-05-03: 10:00:00 via INTRAVENOUS
  Filled 2015-05-03: qty 250

## 2015-05-03 MED ORDER — HEPARIN SOD (PORK) LOCK FLUSH 100 UNIT/ML IV SOLN
INTRAVENOUS | Status: AC
  Filled 2015-05-03: qty 5

## 2015-05-03 MED ORDER — HEPARIN SOD (PORK) LOCK FLUSH 100 UNIT/ML IV SOLN
500.0000 [IU] | Freq: Once | INTRAVENOUS | Status: AC
  Administered 2015-05-03: 500 [IU] via INTRAVENOUS

## 2015-05-03 MED ORDER — MAGNESIUM OXIDE 400 (241.3 MG) MG PO TABS: 400.0000 mg | ORAL_TABLET | Freq: Every day | ORAL | Status: DC

## 2015-05-03 MED ORDER — SODIUM CHLORIDE 0.9 % IV SOLN: 40.0000 meq | Freq: Once | INTRAVENOUS | Status: DC

## 2015-05-03 NOTE — Progress Notes (Signed)
[place]  [phone]  Fax:(336) [place]: Dec 18, 1945  MR#: [id number]  PYP#:[id number]  Patient Care Team: Guadalupe Maple, MD as PCP - General (Family Medicine) Robert Bellow, MD (General Surgery)  CHIEF COMPLAINT:  For follow up regarding hypokalemia. Currently under treatment for breast cancer.  INTERVAL HISTORY:  Patient is here for further evaluation and treatment consideration regarding hypokalemia. She reports feeling well. She denies and fever, chills, leg cramps, nausea, vomiting, diarrhea, or urinary complaints. She had routine labs drawn yesterday that resulted with a Potassium of 2.5.  REVIEW OF SYSTEMS:   Review of Systems  All other systems reviewed and are negative.   As per HPI. Otherwise, a complete review of systems is negatve.  PAST MEDICAL HISTORY: Past Medical History  Diagnosis Date  . Hypertension 2009  . 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 Special screening for malignant neoplasms, colon   . Hyperlipidemia 1990  . Floaters 2014  . Cancer 2010    right breast ductal carcinoma in situ  . 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 ER 90%, PR 50%, MammoSite partial breast radiation  . Breast cancer 02/21/2009  . 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 Appendectomy  1956  . Partial hysterectomy  1977  . Colonoscopy  2008    ??? Md  . Breast surgery Right 2010    wide excision  . Breast biopsy Right 2010    stereo  . Left axilla biopsy Left 12-14-14    FAMILY HISTORY Family History  Problem Relation Age of Onset  . Cancer Other     unknown family members with ovarian,colon,breast cancers    GYNECOLOGIC HISTORY:  No LMP recorded. Patient has  had a hysterectomy.     ADVANCED DIRECTIVES:    HEALTH MAINTENANCE: History  Substance Use Topics  . Smoking status: Never Smoker   . Smokeless tobacco: Not on file  . Alcohol Use: No     Colonoscopy:  PAP:  Bone density:  Lipid panel:  Allergies  Allergen Reactions  . Penicillin G     Other reaction(s): Pruritic rash  . Sulfa Antibiotics Rash    Other reaction(s): Weal    Current Outpatient Prescriptions  Medication Sig Dispense Refill  . ALPRAZolam (XANAX) 0.5 MG tablet TAKE 1 TABLET BY MOUTH EVERY 8 HOURS AS NEEDED FOR ANXIETY 30 tablet 0  . aspirin 81 MG tablet Take 81 mg by mouth daily.    . CRESTOR 5 MG tablet Take 5 mg by mouth daily at 6 PM.     . fluconazole (DIFLUCAN) 100 MG tablet Take 1 tablet (100 mg total) by mouth daily. 5 tablet 0  . metFORMIN (GLUCOPHAGE) 500 MG tablet Take 500 mg by mouth 2 (two) times daily with a meal.     . ONE TOUCH ULTRA TEST test strip     . Potassium Chloride ER 20 MEQ TBCR Take 40 mEq by mouth daily. 60 tablet 0  . tamoxifen (NOLVADEX) 20 MG tablet TAKE 1 TABLET BY MOUTH EVERY DAY 90 tablet 3  . valsartan-hydrochlorothiazide (DIOVAN-HCT) 320-25 MG per tablet Take 1 tablet by mouth daily.     Marland Kitchen aspirin 81 MG tablet Take by mouth.    Marland Kitchen  metFORMIN (GLUCOPHAGE) 500 MG tablet Take by mouth.    . rosuvastatin (CRESTOR) 5 MG tablet Take by mouth.    . valsartan-hydrochlorothiazide (DIOVAN-HCT) 320-25 MG per tablet Take by mouth.     No current facility-administered medications for this visit.   Facility-Administered Medications Ordered in Other Visits  Medication Dose Route Frequency Provider Last Rate Last Dose  . sodium chloride 0.9 % 250 mL with potassium chloride infusion  40 mEq Intravenous Once Evlyn Kanner, NP      . sodium chloride 0.9 % injection 10 mL  10 mL Intracatheter PRN Forest Gleason, MD   10 mL at 04/18/15 1545    OBJECTIVE: Filed Vitals:   05/03/15 0909  BP: 122/70  Pulse: 105  Temp: 95.1 F (35.1 C)      Body mass index is 26.9 kg/(m^2).    ECOG FS:2 - Symptomatic, <50% confined to bed  General: Well-developed, well-nourished, no acute distress. Eyes: Pink conjunctiva, anicteric sclera. HEENT: Normocephalic, moist mucous membranes, clear oropharnyx. Lungs: Clear to auscultation bilaterally. Heart: Regular rate and rhythm. No rubs, murmurs, or gallops. Musculoskeletal: No edema, cyanosis, or clubbing. Neuro: Alert, answering all questions appropriately. Cranial nerves grossly intact. Skin: No rashes or petechiae noted. Psych: Normal affect. Lymphatics: No cervical LAD.   LAB RESULTS:     Component Value Date/Time   NA 139 05/02/2015 1536   NA 138 03/28/2015 0923   NA 139 12/16/2014 1404   K 2.5* 05/02/2015 1536   K 3.3* 03/28/2015 0923   CL 101 05/02/2015 1536   CL 101 03/28/2015 0923   CO2 29 05/02/2015 1536   CO2 28 03/28/2015 0923   GLUCOSE 169* 05/02/2015 1536   GLUCOSE 173* 03/28/2015 0923   GLUCOSE 120* 12/16/2014 1404   BUN 10 05/02/2015 1536   BUN 9 03/28/2015 0923   BUN 11 12/16/2014 1404   CREATININE 0.54 05/02/2015 1536   CREATININE 0.56 03/28/2015 0923   CALCIUM 8.9 05/02/2015 1536   CALCIUM 8.6* 03/28/2015 0923   PROT 7.3 05/02/2015 1536   PROT 7.1 03/28/2015 0923   PROT 7.8 12/16/2014 1404   ALBUMIN 3.8 05/02/2015 1536   ALBUMIN 3.6 03/28/2015 0923   AST 34 05/02/2015 1536   AST 43* 03/28/2015 0923   ALT 24 05/02/2015 1536   ALT 26 03/28/2015 0923   ALKPHOS 95 05/02/2015 1536   ALKPHOS 85 03/28/2015 0923   BILITOT 0.4 05/02/2015 1536   GFRNONAA >60 05/02/2015 1536   GFRNONAA >60 03/28/2015 0923   GFRAA >60 05/02/2015 1536   GFRAA >60 03/28/2015 0923    No results found for: SPEP, UPEP  Lab Results  Component Value Date   WBC 8.8 05/02/2015   NEUTROABS 6.2 05/02/2015   HGB 11.3* 05/02/2015   HCT 35.1 05/02/2015   MCV 84.2 05/02/2015   PLT 263 05/02/2015      Chemistry      Component Value Date/Time   NA 139 05/02/2015 1536   NA 138  03/28/2015 0923   NA 139 12/16/2014 1404   K 2.5* 05/02/2015 1536   K 3.3* 03/28/2015 0923   CL 101 05/02/2015 1536   CL 101 03/28/2015 0923   CO2 29 05/02/2015 1536   CO2 28 03/28/2015 0923   BUN 10 05/02/2015 1536   BUN 9 03/28/2015 0923   BUN 11 12/16/2014 1404   CREATININE 0.54 05/02/2015 1536   CREATININE 0.56 03/28/2015 0923      Component Value Date/Time   CALCIUM 8.9 05/02/2015 1536  CALCIUM 8.6* 03/28/2015 0923   ALKPHOS 95 05/02/2015 1536   ALKPHOS 85 03/28/2015 0923   AST 34 05/02/2015 1536   AST 43* 03/28/2015 0923   ALT 24 05/02/2015 1536   ALT 26 03/28/2015 0923   BILITOT 0.4 05/02/2015 1536       Lab Results  Component Value Date   LABCA2 66.2* 04/25/2015    No components found for: EVOJJ009  No results for input(s): INR in the last 168 hours.  No results found for: COLORURINE, APPEARANCEUR, LABSPEC, PHURINE, GLUCOSEU, HGBUR, BILIRUBINUR, KETONESUR, PROTEINUR, UROBILINOGEN, NITRITE, LEUKOCYTESUR  STUDIES: No results found.  ASSESSMENT:  Hypokalemia Stage IV breast cancer, under treatment with Taxotere, Herceptin, and Perjeta.   PLAN:   1. Hypokalemia. Will supplement with 4meq of potassium IV today. She was started on oral supplement yesterday, has had 1 dose of 52meq. Will also check magnesium level.   Patient has regularly scheduled appointments set up for labs as well as follow up.   Patient expressed understanding and was in agreement with this plan. She also understands that She can call clinic at any time with any questions, concerns, or complaints.    Breast cancer   Staging form: Breast, AJCC 7th Edition     Clinical stage from 04/06/2015: Stage IV (T2, N2, M1) - Signed by Evlyn Kanner, NP on 04/06/2015   Evlyn Kanner, NP   05/03/2015 9:39 AM

## 2015-05-03 NOTE — Progress Notes (Signed)
Dr. Oliva Bustard was available for consultation and review of plan of care for this patient.

## 2015-05-09 ENCOUNTER — Inpatient Hospital Stay: Payer: Medicare Other

## 2015-05-09 ENCOUNTER — Inpatient Hospital Stay (HOSPITAL_BASED_OUTPATIENT_CLINIC_OR_DEPARTMENT_OTHER): Payer: Medicare Other | Admitting: Oncology

## 2015-05-09 VITALS — BP 115/74 | HR 123 | Temp 96.1°F | Wt 146.6 lb

## 2015-05-09 DIAGNOSIS — Z79899 Other long term (current) drug therapy: Secondary | ICD-10-CM

## 2015-05-09 DIAGNOSIS — Z79810 Long term (current) use of selective estrogen receptor modulators (SERMs): Secondary | ICD-10-CM

## 2015-05-09 DIAGNOSIS — Z5111 Encounter for antineoplastic chemotherapy: Secondary | ICD-10-CM | POA: Diagnosis not present

## 2015-05-09 DIAGNOSIS — C50619 Malignant neoplasm of axillary tail of unspecified female breast: Secondary | ICD-10-CM

## 2015-05-09 DIAGNOSIS — C50612 Malignant neoplasm of axillary tail of left female breast: Secondary | ICD-10-CM

## 2015-05-09 DIAGNOSIS — E876 Hypokalemia: Secondary | ICD-10-CM | POA: Diagnosis not present

## 2015-05-09 DIAGNOSIS — Z9049 Acquired absence of other specified parts of digestive tract: Secondary | ICD-10-CM

## 2015-05-09 DIAGNOSIS — C50919 Malignant neoplasm of unspecified site of unspecified female breast: Secondary | ICD-10-CM

## 2015-05-09 DIAGNOSIS — E785 Hyperlipidemia, unspecified: Secondary | ICD-10-CM

## 2015-05-09 DIAGNOSIS — Z170 Estrogen receptor positive status [ER+]: Secondary | ICD-10-CM | POA: Diagnosis not present

## 2015-05-09 DIAGNOSIS — R918 Other nonspecific abnormal finding of lung field: Secondary | ICD-10-CM | POA: Diagnosis not present

## 2015-05-09 DIAGNOSIS — I10 Essential (primary) hypertension: Secondary | ICD-10-CM

## 2015-05-09 DIAGNOSIS — Z90710 Acquired absence of both cervix and uterus: Secondary | ICD-10-CM

## 2015-05-09 LAB — CBC WITH DIFFERENTIAL/PLATELET
Basophils Absolute: 0.1 10*3/uL (ref 0–0.1)
Basophils Relative: 0 %
Eosinophils Absolute: 0 10*3/uL (ref 0–0.7)
Eosinophils Relative: 0 %
HEMATOCRIT: 36 % (ref 35.0–47.0)
Hemoglobin: 11.4 g/dL — ABNORMAL LOW (ref 12.0–16.0)
LYMPHS PCT: 14 %
Lymphs Abs: 1.9 10*3/uL (ref 1.0–3.6)
MCH: 27.3 pg (ref 26.0–34.0)
MCHC: 31.6 g/dL — ABNORMAL LOW (ref 32.0–36.0)
MCV: 86.4 fL (ref 80.0–100.0)
MONO ABS: 0.5 10*3/uL (ref 0.2–0.9)
Monocytes Relative: 4 %
NEUTROS ABS: 11.3 10*3/uL — AB (ref 1.4–6.5)
NEUTROS PCT: 82 %
Platelets: 271 10*3/uL (ref 150–440)
RBC: 4.16 MIL/uL (ref 3.80–5.20)
RDW: 20.4 % — AB (ref 11.5–14.5)
WBC: 13.8 10*3/uL — ABNORMAL HIGH (ref 3.6–11.0)

## 2015-05-09 LAB — COMPREHENSIVE METABOLIC PANEL
ALK PHOS: 83 U/L (ref 38–126)
ALT: 24 U/L (ref 14–54)
AST: 40 U/L (ref 15–41)
Albumin: 3.9 g/dL (ref 3.5–5.0)
Anion gap: 10 (ref 5–15)
BILIRUBIN TOTAL: 0.5 mg/dL (ref 0.3–1.2)
BUN: 10 mg/dL (ref 6–20)
CALCIUM: 8.6 mg/dL — AB (ref 8.9–10.3)
CHLORIDE: 102 mmol/L (ref 101–111)
CO2: 24 mmol/L (ref 22–32)
CREATININE: 0.65 mg/dL (ref 0.44–1.00)
GFR calc Af Amer: >60 mL/min (ref >60)
Glucose, Bld: 192 mg/dL — ABNORMAL HIGH (ref 65–99)
POTASSIUM: 4.1 mmol/L (ref 3.5–5.1)
Sodium: 136 mmol/L (ref 135–145)
Total Protein: 7.5 g/dL (ref 6.5–8.1)

## 2015-05-09 MED ORDER — DIPHENHYDRAMINE HCL 25 MG PO CAPS
50.0000 mg | ORAL_CAPSULE | Freq: Once | ORAL | Status: AC
  Administered 2015-05-09: 50 mg via ORAL
  Filled 2015-05-09: qty 2

## 2015-05-09 MED ORDER — ACETAMINOPHEN 325 MG PO TABS
650.0000 mg | ORAL_TABLET | Freq: Once | ORAL | Status: AC
Start: 2015-05-09 — End: 2015-05-09
  Administered 2015-05-09: 650 mg via ORAL
  Filled 2015-05-09: qty 2

## 2015-05-09 MED ORDER — SODIUM CHLORIDE 0.9 % IJ SOLN
10.0000 mL | INTRAMUSCULAR | Status: DC | PRN
  Administered 2015-05-09: 10 mL
  Filled 2015-05-09: qty 10

## 2015-05-09 MED ORDER — SODIUM CHLORIDE 0.9 % IV SOLN
Freq: Once | INTRAVENOUS | Status: AC
  Administered 2015-05-09: 15:00:00 via INTRAVENOUS
  Filled 2015-05-09: qty 250

## 2015-05-09 MED ORDER — HEPARIN SOD (PORK) LOCK FLUSH 100 UNIT/ML IV SOLN
500.0000 [IU] | Freq: Once | INTRAVENOUS | Status: AC | PRN
  Administered 2015-05-09: 500 [IU]
  Filled 2015-05-09 (×2): qty 5

## 2015-05-09 MED ORDER — TRASTUZUMAB CHEMO INJECTION 440 MG
6.0000 mg/kg | Freq: Once | INTRAVENOUS | Status: AC
  Administered 2015-05-09: 399 mg via INTRAVENOUS
  Filled 2015-05-09: qty 19

## 2015-05-11 ENCOUNTER — Ambulatory Visit: Payer: BLUE CROSS/BLUE SHIELD

## 2015-05-14 ENCOUNTER — Encounter: Payer: Self-pay | Admitting: Oncology

## 2015-05-14 NOTE — Progress Notes (Signed)
[facility] Telephone:(336) [phone]  Fax:(336) [place]: 09-09-1946  MR#: [id number]  RFF#:[id number]  Patient Care Team: Guadalupe Maple, MD as PCP - General (Family Medicine) Robert Bellow, MD (General Surgery)  CHIEF COMPLAINT:  Chief Complaint  Patient presents with  . 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  Estrogen receptor 90%.  Progesterone receptor 60%.  Patient had lumpectomy and MammoSite partial breast radiation therapy followed by tamoxifen for 5 years.  Patient is still taking tamoxifen.. 2 recent mammogram in December of 2015 negative.  Further evaluation by surgeon revealed a palpable mass in the left upper outer quadrant near axilla.  Ultrasound revealed one small normal-appearing lymph node 0.6 cm in lower limit of axilla.  Adjacent to this there was ill-defined multilobulated hypoechoic mass 1.8 x 2.6 cm.  Biopsy of the left axillary mass was done which was positive for high-grade carcinoma.hormone receptors are pending  2,estrogen receptor positive.  Progesterone receptor positive.  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 3.  MRI scan of breast (January, 2015) reveals multiple abnormal enlarged left axillary lymph node There are 2 oval some circumscribed mass at 2:30 and 3:00 position of the left breast.  0.6 cm x 1.3 cm and 4 x 7 mm Non-mass enhancement extending 2.4 cm these masses is 3.6 cm mass.  Findings suggestive of several pulmonary nodules.  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  Overall feeling better.  Appetite is improving.  No nausea no vomiting no diarrhea.  Bony pains have improved.  May 26: 2016 Patient is here has finished total 6 cycles of Taxotere Herceptin and perjeta for stage IV carcinoma off breast. The patient is here to initiate maintenance Herceptin therapy while further assessment of tumor is pending.  Patient has a poor understanding of the disease process and does not want to get any surgery done.  Appetite has been stable.  Does not have any significant problem.  No nausea no vomiting.  Ambulation remains limited. REVIEW OF SYSTEMS:   GENERAL:  Feels good.  Active.  No fevers, sweats or weight loss. PERFORMANCE STATUS (ECOG)01 HEENT:  No visual changes, runny nose, sore throat, mouth sores or tenderness. Lungs: No shortness of breath or cough.  No hemoptysis. Cardiac:  No chest pain, palpitations, orthopnea, or PND. GI:  No nausea, vomiting, diarrhea, constipation, melena or hematochezia. GU:  No urgency, frequency, dysuria, or hematuria. Musculoskeletal:  No back pain.  No joint pain.  No muscle tenderness. Extremities:  No pain or swelling. Skin:  No rashes or skin changes. Neuro:  No headache, numbness or weakness, balance or coordination issues. Endocrine:  No diabetes, thyroid issues, hot flashes or night sweats. Psych:  No mood changes, depression or anxiety. Pain:  No focal pain. Review of systems:  All other systems reviewed and found to be negative.  As per HPI. Otherwise, a complete review of systems is negatve.  PAST MEDICAL HISTORY: Past  Medical History  Diagnosis Date  . Hypertension 2009  . 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 Special screening for malignant neoplasms, colon   . Hyperlipidemia 1990  . Floaters 2014  . Cancer 2010    right breast ductal carcinoma in situ  . 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 ER 90%, PR 50%, MammoSite partial breast radiation  . Breast cancer 02/21/2009  . 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 Axillary mass 12/15/2014    PAST SURGICAL HISTORY: Past Surgical History  Procedure Laterality Date  . Appendectomy  1956  . Partial hysterectomy  1977  . Colonoscopy  2008    ??? Md  . Breast surgery Right 2010    wide excision  . Breast biopsy Right 2010    stereo  . Left axilla biopsy Left 12-14-14    FAMILY HISTORY Family History  Problem Relation Age of Onset  . Cancer Other     unknown family members with ovarian,colon,breast cancers    GYNECOLOGIC HISTORY:  No LMP recorded. Patient has had a hysterectomy.     ADVANCED DIRECTIVES: Patient does not have advance care directive ,an issue has been discussed.  HEALTH MAINTENANCE: History  Substance Use Topics  . Smoking status: Never Smoker   . Smokeless tobacco: Not on file  . Alcohol Use: No       Allergies  Allergen Reactions  . Penicillin G     Other reaction(s): Pruritic rash  . Sulfa Antibiotics Rash    Other reaction(s): Weal    Current Outpatient Prescriptions  Medication Sig Dispense Refill  . ALPRAZolam (XANAX) 0.5 MG tablet TAKE 1 TABLET BY MOUTH EVERY 8 HOURS AS NEEDED FOR ANXIETY 30 tablet 0  . aspirin 81 MG tablet Take 81 mg by mouth daily.    . CRESTOR 5 MG tablet Take 5 mg by mouth daily at 6 PM.     . fluconazole (DIFLUCAN) 100 MG tablet Take 1 tablet (100 mg total) by mouth daily. 5 tablet 0  . magnesium oxide  (MAG-OX) 400 (241.3 MG) MG tablet Take 1 tablet (400 mg total) by mouth daily. 30 tablet 3  . metFORMIN (GLUCOPHAGE) 500 MG tablet Take 500 mg by mouth 2 (two) times daily with a meal.     . ondansetron (ZOFRAN) 4 MG tablet Take 4 mg by mouth every 8 (eight) hours as needed for nausea or vomiting.    . ONE TOUCH ULTRA TEST test strip     . Potassium Chloride ER 20 MEQ TBCR Take 40 mEq by mouth daily. 60 tablet 0  . valsartan-hydrochlorothiazide (DIOVAN-HCT) 320-25 MG per tablet Take 1 tablet by mouth daily.     Marland Kitchen aspirin 81 MG tablet Take by mouth.    . metFORMIN (GLUCOPHAGE) 500 MG tablet Take by mouth.    . rosuvastatin (CRESTOR) 5 MG tablet Take by mouth.    . tamoxifen (NOLVADEX) 20 MG tablet TAKE 1 TABLET BY MOUTH EVERY DAY (  Patient not taking: Reported on 05/09/2015) 90 tablet 3  . valsartan-hydrochlorothiazide (DIOVAN-HCT) 320-25 MG per tablet Take by mouth.     No current facility-administered medications for this visit.   Facility-Administered Medications Ordered in Other Visits  Medication Dose Route Frequency Provider Last Rate Last Dose  . sodium chloride 0.9 % 250 mL with potassium chloride 40 mEq infusion   Intravenous Continuous Forest Gleason, MD   Stopped at 05/03/15 1229  . sodium chloride 0.9 % injection 10 mL  10 mL Intracatheter PRN Forest Gleason, MD   10 mL at 04/18/15 1545    OBJECTIVE:  Filed Vitals:   05/09/15 1350  BP: 115/74  Pulse: 123  Temp: 96.1 F (35.6 C)     Body mass index is 26.98 kg/(m^2).    ECOG FS:1 - Symptomatic but completely ambulatory  PHYSICAL EXAM:GENERAL:  Well developed, well nourished, sitting comfortably in the exam room in no acute distress. MENTAL STATUS:  Alert and oriented to person, place and time. HEAD:  alopecia Normocephalic, atraumatic, face symmetric, no Cushingoid features. EYES:  *.  Pupils equal round and reactive to light and accomodation.  No conjunctivitis or scleral icterus. ENT:  Oropharynx clear without lesion.  Tongue  normal. Mucous membranes moist.  RESPIRATORY:  Clear to auscultation without rales, wheezes or rhonchi. CARDIOVASCULAR:  Regular rate and rhythm without murmur, rub or gallop. BREAST:  Right breast without masses, skin changes or nipple discharge.  Left breast without masses, skin changes or nipple discharge. ABDOMEN:  Soft, non-tender, with active bowel sounds, and no hepatosplenomegaly.  No masses. BACK:  No CVA tenderness.  No tenderness on percussion of the back or rib cage. SKIN:  No rashes, ulcers or lesions. EXTREMITIES: No edema, no skin discoloration or tenderness.  No palpable cords. LYMPH NODES: No palpable cervical, supraclavicular, axillary or inguinal adenopathy  NEUROLOGICAL: Unremarkable. PSYCH:  Appropriate.   LAB RESULTS:  Appointment on 05/09/2015  Component Date Value Ref Range Status  . WBC 05/09/2015 13.8* 3.6 - 11.0 K/uL Final  . RBC 05/09/2015 4.16  3.80 - 5.20 MIL/uL Final  . Hemoglobin 05/09/2015 11.4* 12.0 - 16.0 g/dL Final  . HCT 05/09/2015 36.0  35.0 - 47.0 % Final  . MCV 05/09/2015 86.4  80.0 - 100.0 fL Final  . MCH 05/09/2015 27.3  26.0 - 34.0 pg Final  . MCHC 05/09/2015 31.6* 32.0 - 36.0 g/dL Final  . RDW 05/09/2015 20.4* 11.5 - 14.5 % Final  . Platelets 05/09/2015 271  150 - 440 K/uL Final  . Neutrophils Relative % 05/09/2015 82   Final  . Neutro Abs 05/09/2015 11.3* 1.4 - 6.5 K/uL Final  . Lymphocytes Relative 05/09/2015 14   Final  . Lymphs Abs 05/09/2015 1.9  1.0 - 3.6 K/uL Final  . Monocytes Relative 05/09/2015 4   Final  . Monocytes Absolute 05/09/2015 0.5  0.2 - 0.9 K/uL Final  . Eosinophils Relative 05/09/2015 0   Final  . Eosinophils Absolute 05/09/2015 0.0  0 - 0.7 K/uL Final  . Basophils Relative 05/09/2015 0   Final  . Basophils Absolute 05/09/2015 0.1  0 - 0.1 K/uL Final  . Sodium 05/09/2015 136  135 - 145 mmol/L Final  . Potassium 05/09/2015 4.1  3.5 - 5.1 mmol/L Final  . Chloride 05/09/2015 102  101 - 111 mmol/L Final  . CO2  05/09/2015 24  22 - 32 mmol/L Final  . Glucose, Bld 05/09/2015 192* 65 - 99 mg/dL Final  . BUN 05/09/2015 10  6 -  20 mg/dL Final  . Creatinine, Ser 05/09/2015 0.65  0.44 - 1.00 mg/dL Final  . Calcium 05/09/2015 8.6* 8.9 - 10.3 mg/dL Final  . Total Protein 05/09/2015 7.5  6.5 - 8.1 g/dL Final  . Albumin 05/09/2015 3.9  3.5 - 5.0 g/dL Final  . AST 05/09/2015 40  15 - 41 U/L Final  . ALT 05/09/2015 24  14 - 54 U/L Final  . Alkaline Phosphatase 05/09/2015 83  38 - 126 U/L Final  . Total Bilirubin 05/09/2015 0.5  0.3 - 1.2 mg/dL Final  . GFR calc non Af Amer 05/09/2015 >60  >60 mL/min Final  . GFR calc Af Amer 05/09/2015 >60  >60 mL/min Final   Comment: (NOTE) The eGFR has been calculated using the CKD EPI equation. This calculation has not been validated in all clinical situations. eGFR's persistently <60 mL/min signify possible Chronic Kidney Disease.   . Anion gap 05/09/2015 10  5 - 15 Final    Lab Results  Component Value Date   LABCA2 66.2* 04/25/2015   No results found for: CA199 Lab Results  Component Value Date   CEA 2.2 12/16/2014   No results found for: PSA No results found for: CA125   STUDIES: No results found.  ASSESSMENT: This is a 6 chemotherapy cycle on the patient.  After this treatment patient will be reevaluated with another PET scan or reassessment of the tumor Possibility of local therapy would be considered if needed  PET scan has been ordered.  Will continue Herceptin. Patient has significant good response then will continue antiviral hormonal therapy with Herceptin at present time.  Patient is definitely again said any mastectomy depending on the response we may discuss palliative  Mastectomy or radiation therapy.  MEDICAL DECISION MAKING:  All lab data has been reviewed Continue chemotherapy with Taxotere PERJETA and Herceptin Continue Xgeva After reevaluation with PET scan consideration of local therapy patient has a significant response or  consideration off letrozole and IBRANCE as maintenance therapy  Patient expressed understanding and was in agreement with this plan. She also understands that She can call clinic at any time with any questions, concerns, or complaints.    Breast cancer   Staging form: Breast, AJCC 7th Edition     Clinical stage from 04/06/2015: Stage IV (T2, N2, M1) - Signed by Evlyn Kanner, NP on 04/06/2015   Forest Gleason, MD   05/14/2015 9:36 AM

## 2015-05-19 NOTE — Progress Notes (Unsigned)
PSN met with patient today to explain the process for making payment arrangements for her health care services.

## 2015-05-26 ENCOUNTER — Telehealth: Payer: Self-pay | Admitting: *Deleted

## 2015-05-26 ENCOUNTER — Other Ambulatory Visit: Payer: Self-pay | Admitting: Oncology

## 2015-05-26 DIAGNOSIS — C50919 Malignant neoplasm of unspecified site of unspecified female breast: Secondary | ICD-10-CM

## 2015-05-26 NOTE — Telephone Encounter (Signed)
Per Dr. Mike Gip, pt needs to keep site clean and dry at this time and to call back if symptoms worsen.

## 2015-05-26 NOTE — Telephone Encounter (Signed)
Dr. Mike Gip would like to check potassium level tomorrow since pt is taking high dosage of potassium tablets. Pt instructed to come into clinic on 6/10 around 10am for labs. Pt verbalized understanding.

## 2015-05-26 NOTE — Telephone Encounter (Signed)
Pt called to inform that has another small ulcer on the back of head and is concerned if needs to be evaluated. Pt does not have fever, ulcer is not bothering her in any kind of way, just is concerned. States that had previous ulcer in a different area on back of her head and that area is healing at this time after keeping area clean and dry with soap and water.

## 2015-05-27 ENCOUNTER — Inpatient Hospital Stay: Payer: Medicare Other | Attending: Oncology

## 2015-05-27 DIAGNOSIS — E785 Hyperlipidemia, unspecified: Secondary | ICD-10-CM | POA: Insufficient documentation

## 2015-05-27 DIAGNOSIS — Z809 Family history of malignant neoplasm, unspecified: Secondary | ICD-10-CM | POA: Insufficient documentation

## 2015-05-27 DIAGNOSIS — J984 Other disorders of lung: Secondary | ICD-10-CM | POA: Insufficient documentation

## 2015-05-27 DIAGNOSIS — Z170 Estrogen receptor positive status [ER+]: Secondary | ICD-10-CM | POA: Insufficient documentation

## 2015-05-27 DIAGNOSIS — I10 Essential (primary) hypertension: Secondary | ICD-10-CM | POA: Insufficient documentation

## 2015-05-27 DIAGNOSIS — Z79810 Long term (current) use of selective estrogen receptor modulators (SERMs): Secondary | ICD-10-CM | POA: Diagnosis not present

## 2015-05-27 DIAGNOSIS — Z853 Personal history of malignant neoplasm of breast: Secondary | ICD-10-CM | POA: Diagnosis not present

## 2015-05-27 DIAGNOSIS — C7951 Secondary malignant neoplasm of bone: Secondary | ICD-10-CM | POA: Insufficient documentation

## 2015-05-27 DIAGNOSIS — C50919 Malignant neoplasm of unspecified site of unspecified female breast: Secondary | ICD-10-CM

## 2015-05-27 DIAGNOSIS — L723 Sebaceous cyst: Secondary | ICD-10-CM | POA: Insufficient documentation

## 2015-05-27 DIAGNOSIS — Z5111 Encounter for antineoplastic chemotherapy: Secondary | ICD-10-CM | POA: Diagnosis not present

## 2015-05-27 DIAGNOSIS — K7460 Unspecified cirrhosis of liver: Secondary | ICD-10-CM | POA: Insufficient documentation

## 2015-05-27 DIAGNOSIS — C50612 Malignant neoplasm of axillary tail of left female breast: Secondary | ICD-10-CM | POA: Insufficient documentation

## 2015-05-27 LAB — CBC WITH DIFFERENTIAL/PLATELET
Basophils Absolute: 0 10*3/uL (ref 0–0.1)
Basophils Relative: 0 %
EOS PCT: 7 %
Eosinophils Absolute: 0.6 10*3/uL (ref 0–0.7)
HCT: 37.2 % (ref 35.0–47.0)
Hemoglobin: 11.7 g/dL — ABNORMAL LOW (ref 12.0–16.0)
Lymphocytes Relative: 19 %
Lymphs Abs: 1.5 10*3/uL (ref 1.0–3.6)
MCH: 26.8 pg (ref 26.0–34.0)
MCHC: 31.5 g/dL — ABNORMAL LOW (ref 32.0–36.0)
MCV: 85.1 fL (ref 80.0–100.0)
Monocytes Absolute: 0.5 10*3/uL (ref 0.2–0.9)
Monocytes Relative: 6 %
Neutro Abs: 5.6 10*3/uL (ref 1.4–6.5)
Neutrophils Relative %: 68 %
Platelets: 199 10*3/uL (ref 150–440)
RBC: 4.37 MIL/uL (ref 3.80–5.20)
RDW: 19.7 % — ABNORMAL HIGH (ref 11.5–14.5)
WBC: 8.2 10*3/uL (ref 3.6–11.0)

## 2015-05-27 LAB — COMPREHENSIVE METABOLIC PANEL
ALBUMIN: 4 g/dL (ref 3.5–5.0)
ALT: 25 U/L (ref 14–54)
AST: 37 U/L (ref 15–41)
Alkaline Phosphatase: 84 U/L (ref 38–126)
Anion gap: 9 (ref 5–15)
BUN: 7 mg/dL (ref 6–20)
CALCIUM: 8.7 mg/dL — AB (ref 8.9–10.3)
CHLORIDE: 100 mmol/L — AB (ref 101–111)
CO2: 23 mmol/L (ref 22–32)
Creatinine, Ser: 0.6 mg/dL (ref 0.44–1.00)
GFR calc Af Amer: >60 mL/min (ref >60)
GLUCOSE: 177 mg/dL — AB (ref 65–99)
Potassium: 3.9 mmol/L (ref 3.5–5.1)
SODIUM: 132 mmol/L — AB (ref 135–145)
TOTAL PROTEIN: 7.5 g/dL (ref 6.5–8.1)
Total Bilirubin: 0.5 mg/dL (ref 0.3–1.2)

## 2015-05-27 NOTE — Telephone Encounter (Signed)
Called pt to inform her that potassium level is 3.9 today and would like to continue pt on potassium tablets at this time. Pt verbalized understanding.

## 2015-06-03 ENCOUNTER — Ambulatory Visit
Admission: RE | Admit: 2015-06-03 | Discharge: 2015-06-03 | Disposition: A | Discharge status: Home / Self Care | Payer: Medicare Other | Source: Ambulatory Visit | Attending: Oncology | Admitting: Oncology

## 2015-06-03 DIAGNOSIS — K7460 Unspecified cirrhosis of liver: Secondary | ICD-10-CM | POA: Diagnosis not present

## 2015-06-03 DIAGNOSIS — C50619 Malignant neoplasm of axillary tail of unspecified female breast: Secondary | ICD-10-CM | POA: Diagnosis not present

## 2015-06-03 DIAGNOSIS — Z79899 Other long term (current) drug therapy: Secondary | ICD-10-CM | POA: Insufficient documentation

## 2015-06-03 DIAGNOSIS — J8410 Pulmonary fibrosis, unspecified: Secondary | ICD-10-CM | POA: Insufficient documentation

## 2015-06-03 DIAGNOSIS — C50912 Malignant neoplasm of unspecified site of left female breast: Secondary | ICD-10-CM | POA: Diagnosis not present

## 2015-06-03 DIAGNOSIS — C50911 Malignant neoplasm of unspecified site of right female breast: Secondary | ICD-10-CM | POA: Diagnosis not present

## 2015-06-03 LAB — GLUCOSE, CAPILLARY: GLUCOSE-CAPILLARY: 164 mg/dL — AB (ref 65–99)

## 2015-06-03 MED ORDER — FLUDEOXYGLUCOSE F - 18 (FDG) INJECTION
12.7300 | Freq: Once | INTRAVENOUS | Status: AC | PRN
  Administered 2015-06-03: 12.73 via INTRAVENOUS

## 2015-06-06 ENCOUNTER — Inpatient Hospital Stay: Payer: Medicare Other

## 2015-06-06 ENCOUNTER — Inpatient Hospital Stay (HOSPITAL_BASED_OUTPATIENT_CLINIC_OR_DEPARTMENT_OTHER): Payer: Medicare Other | Admitting: Oncology

## 2015-06-06 VITALS — BP 162/83 | HR 94 | Temp 97.1°F | Wt 146.6 lb

## 2015-06-06 DIAGNOSIS — Z79810 Long term (current) use of selective estrogen receptor modulators (SERMs): Secondary | ICD-10-CM | POA: Diagnosis not present

## 2015-06-06 DIAGNOSIS — C50612 Malignant neoplasm of axillary tail of left female breast: Secondary | ICD-10-CM

## 2015-06-06 DIAGNOSIS — Z170 Estrogen receptor positive status [ER+]: Secondary | ICD-10-CM

## 2015-06-06 DIAGNOSIS — C50919 Malignant neoplasm of unspecified site of unspecified female breast: Secondary | ICD-10-CM

## 2015-06-06 DIAGNOSIS — C7951 Secondary malignant neoplasm of bone: Secondary | ICD-10-CM | POA: Diagnosis not present

## 2015-06-06 DIAGNOSIS — Z5111 Encounter for antineoplastic chemotherapy: Secondary | ICD-10-CM | POA: Diagnosis not present

## 2015-06-06 DIAGNOSIS — I10 Essential (primary) hypertension: Secondary | ICD-10-CM

## 2015-06-06 DIAGNOSIS — C50619 Malignant neoplasm of axillary tail of unspecified female breast: Secondary | ICD-10-CM

## 2015-06-06 DIAGNOSIS — Z853 Personal history of malignant neoplasm of breast: Secondary | ICD-10-CM

## 2015-06-06 DIAGNOSIS — K7460 Unspecified cirrhosis of liver: Secondary | ICD-10-CM

## 2015-06-06 DIAGNOSIS — L723 Sebaceous cyst: Secondary | ICD-10-CM

## 2015-06-06 DIAGNOSIS — J984 Other disorders of lung: Secondary | ICD-10-CM

## 2015-06-06 DIAGNOSIS — Z809 Family history of malignant neoplasm, unspecified: Secondary | ICD-10-CM

## 2015-06-06 DIAGNOSIS — E785 Hyperlipidemia, unspecified: Secondary | ICD-10-CM

## 2015-06-06 LAB — COMPREHENSIVE METABOLIC PANEL
ALT: 28 U/L (ref 14–54)
AST: 38 U/L (ref 15–41)
Albumin: 4.1 g/dL (ref 3.5–5.0)
Alkaline Phosphatase: 92 U/L (ref 38–126)
Anion gap: 7 (ref 5–15)
BUN: 10 mg/dL (ref 6–20)
CHLORIDE: 101 mmol/L (ref 101–111)
CO2: 28 mmol/L (ref 22–32)
Calcium: 9.1 mg/dL (ref 8.9–10.3)
Creatinine, Ser: 0.69 mg/dL (ref 0.44–1.00)
Glucose, Bld: 232 mg/dL — ABNORMAL HIGH (ref 65–99)
Potassium: 3.6 mmol/L (ref 3.5–5.1)
Sodium: 136 mmol/L (ref 135–145)
TOTAL PROTEIN: 7.8 g/dL (ref 6.5–8.1)
Total Bilirubin: 0.5 mg/dL (ref 0.3–1.2)

## 2015-06-06 LAB — CBC WITH DIFFERENTIAL/PLATELET
BASOS ABS: 0 10*3/uL (ref 0–0.1)
Basophils Relative: 0 %
Eosinophils Absolute: 0.3 10*3/uL (ref 0–0.7)
Eosinophils Relative: 3 %
HCT: 38.8 % (ref 35.0–47.0)
Hemoglobin: 12.3 g/dL (ref 12.0–16.0)
LYMPHS ABS: 1.6 10*3/uL (ref 1.0–3.6)
LYMPHS PCT: 16 %
MCH: 26.6 pg (ref 26.0–34.0)
MCHC: 31.6 g/dL — ABNORMAL LOW (ref 32.0–36.0)
MCV: 84.3 fL (ref 80.0–100.0)
Monocytes Absolute: 0.5 10*3/uL (ref 0.2–0.9)
Monocytes Relative: 6 %
NEUTROS ABS: 7.2 10*3/uL — AB (ref 1.4–6.5)
Neutrophils Relative %: 75 %
PLATELETS: 227 10*3/uL (ref 150–440)
RBC: 4.6 MIL/uL (ref 3.80–5.20)
RDW: 19 % — AB (ref 11.5–14.5)
WBC: 9.7 10*3/uL (ref 3.6–11.0)

## 2015-06-06 MED ORDER — DIPHENHYDRAMINE HCL 25 MG PO CAPS
50.0000 mg | ORAL_CAPSULE | Freq: Once | ORAL | Status: AC
  Administered 2015-06-06: 50 mg via ORAL
  Filled 2015-06-06: qty 2

## 2015-06-06 MED ORDER — HEPARIN SOD (PORK) LOCK FLUSH 100 UNIT/ML IV SOLN
500.0000 [IU] | Freq: Once | INTRAVENOUS | Status: AC | PRN
  Administered 2015-06-06: 500 [IU]
  Filled 2015-06-06: qty 5

## 2015-06-06 MED ORDER — SODIUM CHLORIDE 0.9 % IV SOLN
Freq: Once | INTRAVENOUS | Status: AC
  Administered 2015-06-06: 15:00:00 via INTRAVENOUS
  Filled 2015-06-06: qty 1000

## 2015-06-06 MED ORDER — SODIUM CHLORIDE 0.9 % IJ SOLN
10.0000 mL | INTRAMUSCULAR | Status: DC | PRN
  Administered 2015-06-06: 10 mL
  Filled 2015-06-06: qty 10

## 2015-06-06 MED ORDER — DOXYCYCLINE HYCLATE 100 MG PO TABS: 100.0000 mg | ORAL_TABLET | Freq: Two times a day (BID) | ORAL | Status: DC

## 2015-06-06 MED ORDER — ACETAMINOPHEN 325 MG PO TABS
650.0000 mg | ORAL_TABLET | Freq: Once | ORAL | Status: AC
  Administered 2015-06-06: 650 mg via ORAL
  Filled 2015-06-06: qty 2

## 2015-06-06 MED ORDER — TRASTUZUMAB CHEMO INJECTION 440 MG
6.0000 mg/kg | Freq: Once | INTRAVENOUS | Status: AC
  Administered 2015-06-06: 399 mg via INTRAVENOUS
  Filled 2015-06-06: qty 19

## 2015-06-06 NOTE — Progress Notes (Signed)
Pt does not have living will.  Patient never smoked.  Patient has sore/red  great toe on right foot.

## 2015-06-07 LAB — CANCER ANTIGEN 27.29: CA 27.29: 47.4 U/mL — ABNORMAL HIGH (ref 0.0–38.6)

## 2015-06-11 ENCOUNTER — Encounter: Payer: Self-pay | Admitting: Oncology

## 2015-06-11 NOTE — Progress Notes (Signed)
[place] @ [facility] [phone]  Fax:([phone]) [place]: 70-Dec-1947  MR#: [id number]  RWE#:[id number]  Patient Care Team: Guadalupe Maple, MD as PCP - General (Family Medicine) Robert Bellow, MD (General Surgery)  CHIEF COMPLAINT:  Chief Complaint  Patient presents with  . 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  Estrogen receptor 90%.  Progesterone receptor 60%.  Patient had lumpectomy and MammoSite partial breast radiation therapy followed by tamoxifen for 5 years.  Patient is still taking tamoxifen.. 2 recent mammogram in December of 2015 negative.  Further evaluation by surgeon revealed a palpable mass in the left upper outer quadrant near axilla.  Ultrasound revealed one small normal-appearing lymph node 0.6 cm in lower limit of axilla.  Adjacent to this there was ill-defined multilobulated hypoechoic mass 1.8 x 2.6 cm.  Biopsy of the left axillary mass was done which was positive for high-grade carcinoma.hormone receptors are pending  2,estrogen receptor positive.  Progesterone receptor positive.  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 3.  MRI scan of breast (January, 2015) reveals multiple abnormal enlarged left axillary lymph node There are 2 oval some circumscribed mass at 2:30 and 3:00 position of the left breast.  0.6 cm x 1.3 cm and 4 x 7 mm Non-mass enhancement extending 2.4 cm these masses is 3.6 cm mass.  Findings suggestive of several pulmonary nodules.  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   12/16/2014 Initial Diagnosis Malignant neoplasm of axilla    Oncology Flowsheet 04/18/2015 04/21/2015 05/09/2015 06/06/2015  Day, Cycle Day 1, Cycle 6 - - -  dexamethasone (DECADRON) IV [ 12 mg ] - - -    diphenhydrAMINE (BENADRYL) PO - 50 mg 50 mg 50 mg  DOCEtaxel (TAXOTERE) IV 75 mg/m2 - - -  fosaprepitant (EMEND) IV [ 150 mg ] - - -  palonosetron (ALOXI) IV 0.25 mg - - -  pertuzumab (PERJETA) IV - 420 mg - -  trastuzumab (HERCEPTIN) IV - 6 mg/kg 6 mg/kg 6 mg/kg    INTERVAL HISTORY: 69 year old lady with a history of carcinoma of breast stage IV disease on Taxotere Herceptin and PERJETA therapy came today for the follow-up.  Overall feeling better.  Appetite is improving.  No nausea no vomiting no diarrhea.  Bony pains have improved.  May 26: 2016 Patient is here has finished total 6 cycles of Taxotere Herceptin and perjeta for stage IV carcinoma off breast. The patient is here to initiate maintenance Herceptin therapy while further assessment of tumor is pending.  Patient has a poor understanding of the disease process and does not want to get any surgery done.  Appetite has been stable.  Does not have any significant problem.  No nausea no vomiting.  Ambulation remains limited. June 06, 2015 Patient is here for ongoing evaluation and continuation of treatment.  Had a restaging PET scan done.  General status has improved.  No nausea no vomiting no diarrhea  REVIEW OF SYSTEMS:   GENERAL:  Feels good.  Active.  No fevers, sweats or weight loss. PERFORMANCE STATUS (ECOG)01 HEENT:  No visual changes, runny nose, sore throat, mouth sores or tenderness. Lungs: No shortness of breath or cough.  No hemoptysis. Cardiac:  No chest pain, palpitations, orthopnea, or PND. GI:  No nausea, vomiting, diarrhea, constipation, melena or hematochezia. GU:  No urgency, frequency, dysuria, or hematuria. Musculoskeletal:  No back pain.  No joint pain.  No muscle tenderness. Extremities:  No pain or swelling. Skin:  No rashes or skin changes. Neuro:  No headache, numbness or weakness, balance or coordination issues. Endocrine:  No diabetes, thyroid issues, hot flashes or night sweats. Psych:  No mood  changes, depression or anxiety. Pain:  No focal pain. Review of systems:  All other systems reviewed and found to be negative.  As per HPI. Otherwise, a complete review of systems is negatve.  PAST MEDICAL HISTORY: Past Medical History  Diagnosis Date  . Hypertension 2009  . 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 Special screening for malignant neoplasms, colon   . Hyperlipidemia 1990  . Floaters 2014  . Cancer 2010    right breast ductal carcinoma in situ  . 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 ER 90%, PR 50%, MammoSite partial breast radiation  . Breast cancer 02/21/2009  . 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 Axillary mass 12/15/2014    PAST SURGICAL HISTORY: Past Surgical History  Procedure Laterality Date  . Appendectomy  1956  . Partial hysterectomy  1977  . Colonoscopy  2008    ??? Md  . Breast surgery Right 2010    wide excision  . Breast biopsy Right 2010    stereo  . Left axilla biopsy Left 12-14-14    FAMILY HISTORY Family History  Problem Relation Age of Onset  . Cancer Other     unknown family members with ovarian,colon,breast cancers    GYNECOLOGIC HISTORY:  No LMP recorded. Patient has had a hysterectomy.     ADVANCED DIRECTIVES: Patient does not have advance care directive ,an issue has been discussed.  HEALTH MAINTENANCE: History  Substance Use Topics  . Smoking status: Never Smoker   . Smokeless tobacco: Not on file  . Alcohol Use: No       Allergies  Allergen Reactions  . Penicillin G     Other reaction(s): Pruritic rash  . Sulfa Antibiotics Rash    Other reaction(s): Weal    Current Outpatient Prescriptions  Medication Sig Dispense Refill  . ALPRAZolam (XANAX) 0.5 MG tablet TAKE 1 TABLET BY MOUTH EVERY 8 HOURS AS NEEDED FOR ANXIETY 30 tablet 0  . aspirin  81 MG tablet Take 81 mg by mouth daily.    . CRESTOR 5 MG tablet Take 5 mg by mouth daily at 6 PM.     . fluconazole (DIFLUCAN) 100 MG tablet Take 1 tablet (100 mg total) by mouth daily. 5 tablet 0  . KLOR-CON M20 20 MEQ tablet TAKE 2 TABLETS BY MOUTH EVERY DAY 60 tablet 0  . magnesium oxide (MAG-OX) 400 (241.3 MG) MG tablet Take 1 tablet (400 mg total) by mouth daily. 30 tablet 3  . metFORMIN (GLUCOPHAGE) 500 MG tablet Take 500 mg by mouth 2 (two) times daily with a meal.     . ondansetron (ZOFRAN) 4 MG tablet Take 4 mg by mouth every 8 (eight) hours as needed for nausea or vomiting.    . ONE TOUCH ULTRA TEST test strip     . rosuvastatin (CRESTOR) 5 MG tablet Take by mouth.    . tamoxifen (NOLVADEX) 20 MG tablet TAKE 1 TABLET  BY MOUTH EVERY DAY 90 tablet 3  . valsartan-hydrochlorothiazide (DIOVAN-HCT) 320-25 MG per tablet Take 1 tablet by mouth daily.     . valsartan-hydrochlorothiazide (DIOVAN-HCT) 320-25 MG per tablet Take by mouth.    Marland Kitchen aspirin 81 MG tablet Take by mouth.    . doxycycline (VIBRA-TABS) 100 MG tablet Take 1 tablet (100 mg total) by mouth 2 (two) times daily. 14 tablet 0  . metFORMIN (GLUCOPHAGE) 500 MG tablet Take by mouth.     No current facility-administered medications for this visit.   Facility-Administered Medications Ordered in Other Visits  Medication Dose Route Frequency Provider Last Rate Last Dose  . sodium chloride 0.9 % 250 mL with potassium chloride 40 mEq infusion   Intravenous Continuous Forest Gleason, MD   Stopped at 05/03/15 1229  . sodium chloride 0.9 % injection 10 mL  10 mL Intracatheter PRN Forest Gleason, MD   10 mL at 04/18/15 1545    OBJECTIVE:  Filed Vitals:   06/06/15 1356  BP: 162/83  Pulse: 94  Temp: 97.1 F (36.2 C)     Body mass index is 26.98 kg/(m^2).    ECOG FS:1 - Symptomatic but completely ambulatory  PHYSICAL EXAM:GENERAL:  Well developed, well nourished, sitting comfortably in the exam room in no acute distress. MENTAL STATUS:   Alert and oriented to person, place and time. HEAD:  alopecia Normocephalic, atraumatic, face symmetric, no Cushingoid features. EYES:  *.  Pupils equal round and reactive to light and accomodation.  No conjunctivitis or scleral icterus. ENT:  Oropharynx clear without lesion.  Tongue normal. Mucous membranes moist.  RESPIRATORY:  Clear to auscultation without rales, wheezes or rhonchi. CARDIOVASCULAR:  Regular rate and rhythm without murmur, rub or gallop. BREAST:  Right breast without masses, skin changes or nipple discharge.  Left breast without masses, skin changes or nipple discharge. ABDOMEN:  Soft, non-tender, with active bowel sounds, and no hepatosplenomegaly.  No masses. BACK:  No CVA tenderness.  No tenderness on percussion of the back or rib cage. SKIN:  No rashes, ulcers or lesions. EXTREMITIES: No edema, no skin discoloration or tenderness.  No palpable cords. LYMPH NODES: No palpable cervical, supraclavicular, axillary or inguinal adenopathy  NEUROLOGICAL: Unremarkable. PSYCH:  Appropriate.   LAB RESULTS:  Appointment on 06/06/2015  Component Date Value Ref Range Status  . WBC 06/06/2015 9.7  3.6 - 11.0 K/uL Final  . RBC 06/06/2015 4.60  3.80 - 5.20 MIL/uL Final  . Hemoglobin 06/06/2015 12.3  12.0 - 16.0 g/dL Final  . HCT 06/06/2015 38.8  35.0 - 47.0 % Final  . MCV 06/06/2015 84.3  80.0 - 100.0 fL Final  . MCH 06/06/2015 26.6  26.0 - 34.0 pg Final  . MCHC 06/06/2015 31.6* 32.0 - 36.0 g/dL Final  . RDW 06/06/2015 19.0* 11.5 - 14.5 % Final  . Platelets 06/06/2015 227  150 - 440 K/uL Final  . Neutrophils Relative % 06/06/2015 75   Final  . Neutro Abs 06/06/2015 7.2* 1.4 - 6.5 K/uL Final  . Lymphocytes Relative 06/06/2015 16   Final  . Lymphs Abs 06/06/2015 1.6  1.0 - 3.6 K/uL Final  . Monocytes Relative 06/06/2015 6   Final  . Monocytes Absolute 06/06/2015 0.5  0.2 - 0.9 K/uL Final  . Eosinophils Relative 06/06/2015 3   Final  . Eosinophils Absolute 06/06/2015 0.3  0 -  0.7 K/uL Final  . Basophils Relative 06/06/2015 0   Final  . Basophils Absolute 06/06/2015 0.0  0 - 0.1 K/uL Final  .  Sodium 06/06/2015 136  135 - 145 mmol/L Final  . Potassium 06/06/2015 3.6  3.5 - 5.1 mmol/L Final  . Chloride 06/06/2015 101  101 - 111 mmol/L Final  . CO2 06/06/2015 28  22 - 32 mmol/L Final  . Glucose, Bld 06/06/2015 232* 65 - 99 mg/dL Final  . BUN 06/06/2015 10  6 - 20 mg/dL Final  . Creatinine, Ser 06/06/2015 0.69  0.44 - 1.00 mg/dL Final  . Calcium 06/06/2015 9.1  8.9 - 10.3 mg/dL Final  . Total Protein 06/06/2015 7.8  6.5 - 8.1 g/dL Final  . Albumin 06/06/2015 4.1  3.5 - 5.0 g/dL Final  . AST 06/06/2015 38  15 - 41 U/L Final  . ALT 06/06/2015 28  14 - 54 U/L Final  . Alkaline Phosphatase 06/06/2015 92  38 - 126 U/L Final  . Total Bilirubin 06/06/2015 0.5  0.3 - 1.2 mg/dL Final  . GFR calc non Af Amer 06/06/2015 >60  >60 mL/min Final  . GFR calc Af Amer 06/06/2015 >60  >60 mL/min Final   Comment: (NOTE) The eGFR has been calculated using the CKD EPI equation. This calculation has not been validated in all clinical situations. eGFR's persistently <60 mL/min signify possible Chronic Kidney Disease.   . Anion gap 06/06/2015 7  5 - 15 Final  . CA 27.29 06/06/2015 47.4* 0.0 - 38.6 U/mL Final   Comment: (NOTE) [employer] Cent[facility]/ACS methodology Performed At: [facility] [place], [place] [id number] Lindon Romp MD UY:[id number]     Lab Results  Component Value Date   LABCA2 47.4* 06/06/2015   No results found for: CA199 Lab Results  Component Value Date   CEA 2.2 12/16/2014   No results found for: PSA No results found for: CA125   STUDIES: Nm Pet Image Restag (ps) Skull Base To Thigh  06/03/2015   CLINICAL DATA:  Subsequent treatment strategy for bilateral breast cancer, most recently diagnosed in the axillary tail 12/16/2014. Chemotherapy 3.5 weeks ago.  EXAM: NUCLEAR MEDICINE PET SKULL BASE TO THIGH  TECHNIQUE: 12.73 mCi F-18  FDG was injected intravenously. Full-ring PET imaging was performed from the skull base to thigh after the radiotracer. CT data was obtained and used for attenuation correction and anatomic localization.  FASTING BLOOD GLUCOSE:  Value: 164 mg/dl  COMPARISON:  PET-CT 03/02/2015.  FINDINGS: NECK  No hypermetabolic cervical lymph nodes are identified.There are no lesions of the pharyngeal mucosal space. There is a stable probable 1.9 cm sebaceous cyst in the right lateral neck on image 36, without associated metabolic activity.  CHEST  No abnormal breast activity identified. There is continued improvement in the left axillary lymph node which measures 5 mm short axis on image 77 (previously 8 mm). This demonstrates minimal metabolic activity with an SUV max of 1.7 (previously 3.7). No other hypermetabolic axillary, mediastinal, internal mammary or hilar lymph nodes demonstrated. There is no hypermetabolic pulmonary activity. No discrete pulmonary nodules demonstrated. There are peripheral fibrotic changes in both lungs. There are stable calcifications within the right breast. Right subclavian Port-A-Cath extends to the SVC right atrial junction.  ABDOMEN/PELVIS  There is no hypermetabolic activity within the liver, adrenal glands, spleen or pancreas. There is no hypermetabolic nodal activity. Morphologic changes of cirrhosis noted within the liver.  SKELETON  No hypermetabolic osseous metastases demonstrated. Diffuse sclerotic metastases are grossly stable without evidence of pathologic fracture or epidural tumor.  IMPRESSION: 1. Near-complete resolution of residual left axillary nodal activity. No hypermetabolic distant metastases  demonstrated. 2. Stable chronic sclerotic osseous metastases, likely treated. 3. No new findings. 4. 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 Stable morphologic changes of cirrhosis.   Electronically Signed   By: Richardean Sale M.D.   On: 06/03/2015 11:45     ASSESSMENT: Scan has been reviewed so significant response.  Patient does not want any local therapy.  Considering that patient had extensive disease local therapy may not be helpful at the present time in overall changing overall survival. We will continue Herceptin Conside  IBRANCE and etrozole therapy     MEDICAL DECISION MAKING:  All medical data has been reviewed. PET scan has been reviewed.    Breast cancer   Staging form: Breast, AJCC 7th Edition     Clinical stage from 04/06/2015: Stage IV (T2, N2, M1) - Signed by Evlyn Kanner, NP on 04/06/2015   Forest Gleason, MD   06/11/2015 7:52 AM

## 2015-06-13 ENCOUNTER — Telehealth: Payer: Self-pay | Admitting: *Deleted

## 2015-06-13 NOTE — Telephone Encounter (Signed)
Informed pt to keep area clean and dry and not to use powder per Dr Oliva Bustard. If not immproved in 2 days, she is to call back to be seen. Pt verbalized understanding and repeated this back to me

## 2015-06-16 ENCOUNTER — Telehealth: Payer: Self-pay | Admitting: *Deleted

## 2015-06-16 DIAGNOSIS — C50919 Malignant neoplasm of unspecified site of unspecified female breast: Secondary | ICD-10-CM

## 2015-06-16 MED ORDER — LETROZOLE 2.5 MG PO TABS: 2.5000 mg | ORAL_TABLET | Freq: Every day | ORAL | Status: DC

## 2015-06-16 MED ORDER — PALBOCICLIB 125 MG PO CAPS: 125.0000 mg | ORAL_CAPSULE | Freq: Every day | ORAL | Status: DC

## 2015-06-16 NOTE — Telephone Encounter (Signed)
New precription for Ibrance and letrozole faxed to [facility]. Informed pt of the new prescriptions and that a specialty pharmacy will be contacting pt regarding delivery. Informed pt to callback if has any further questions or concerns. Pt verbalized understanding.

## 2015-06-16 NOTE — Telephone Encounter (Signed)
Called to report that the redness under her breast has improved and she will continue to monitor

## 2015-06-23 ENCOUNTER — Telehealth: Payer: Self-pay | Admitting: *Deleted

## 2015-06-23 NOTE — Telephone Encounter (Signed)
Kelly from [facility] called to let us know that Leslee Home that needed prior authorization has been approved.  However, the patient will have a high copay of $2,877.28 and the majority of that cost is the Branch, as the Letrozole is only $34.33.  She will contact the patient to see if she wants to apply for copay assistance. If we have further questions we can contact her @ [phone].

## 2015-06-24 ENCOUNTER — Other Ambulatory Visit: Payer: Self-pay | Admitting: *Deleted

## 2015-06-24 ENCOUNTER — Telehealth: Payer: Self-pay | Admitting: *Deleted

## 2015-06-24 DIAGNOSIS — C50619 Malignant neoplasm of axillary tail of unspecified female breast: Secondary | ICD-10-CM

## 2015-06-24 NOTE — Telephone Encounter (Signed)
MD to discuss with pt at appt next week.

## 2015-06-24 NOTE — Telephone Encounter (Signed)
Pt wishes to hold off on copay assistance at this time until discusses medication with MD at next appt.

## 2015-06-27 ENCOUNTER — Inpatient Hospital Stay: Payer: Medicare Other | Attending: Oncology | Admitting: Oncology

## 2015-06-27 ENCOUNTER — Encounter: Payer: Self-pay | Admitting: Oncology

## 2015-06-27 ENCOUNTER — Inpatient Hospital Stay: Payer: Medicare Other

## 2015-06-27 VITALS — BP 114/76 | HR 102 | Temp 96.6°F | Wt 144.6 lb

## 2015-06-27 DIAGNOSIS — Z79899 Other long term (current) drug therapy: Secondary | ICD-10-CM | POA: Diagnosis not present

## 2015-06-27 DIAGNOSIS — I429 Cardiomyopathy, unspecified: Secondary | ICD-10-CM

## 2015-06-27 DIAGNOSIS — I10 Essential (primary) hypertension: Secondary | ICD-10-CM | POA: Insufficient documentation

## 2015-06-27 DIAGNOSIS — R531 Weakness: Secondary | ICD-10-CM | POA: Diagnosis not present

## 2015-06-27 DIAGNOSIS — Z7982 Long term (current) use of aspirin: Secondary | ICD-10-CM

## 2015-06-27 DIAGNOSIS — C50919 Malignant neoplasm of unspecified site of unspecified female breast: Secondary | ICD-10-CM

## 2015-06-27 DIAGNOSIS — C50619 Malignant neoplasm of axillary tail of unspecified female breast: Secondary | ICD-10-CM

## 2015-06-27 DIAGNOSIS — Z9049 Acquired absence of other specified parts of digestive tract: Secondary | ICD-10-CM | POA: Diagnosis not present

## 2015-06-27 DIAGNOSIS — K7460 Unspecified cirrhosis of liver: Secondary | ICD-10-CM | POA: Insufficient documentation

## 2015-06-27 DIAGNOSIS — Z5111 Encounter for antineoplastic chemotherapy: Secondary | ICD-10-CM | POA: Diagnosis not present

## 2015-06-27 DIAGNOSIS — Z170 Estrogen receptor positive status [ER+]: Secondary | ICD-10-CM | POA: Diagnosis not present

## 2015-06-27 DIAGNOSIS — Z90710 Acquired absence of both cervix and uterus: Secondary | ICD-10-CM

## 2015-06-27 DIAGNOSIS — E785 Hyperlipidemia, unspecified: Secondary | ICD-10-CM | POA: Diagnosis not present

## 2015-06-27 DIAGNOSIS — Z923 Personal history of irradiation: Secondary | ICD-10-CM | POA: Insufficient documentation

## 2015-06-27 DIAGNOSIS — Z79811 Long term (current) use of aromatase inhibitors: Secondary | ICD-10-CM | POA: Insufficient documentation

## 2015-06-27 DIAGNOSIS — R5383 Other fatigue: Secondary | ICD-10-CM | POA: Diagnosis not present

## 2015-06-27 DIAGNOSIS — C50411 Malignant neoplasm of upper-outer quadrant of right female breast: Secondary | ICD-10-CM | POA: Diagnosis not present

## 2015-06-27 DIAGNOSIS — C7951 Secondary malignant neoplasm of bone: Secondary | ICD-10-CM | POA: Diagnosis not present

## 2015-06-27 LAB — COMPREHENSIVE METABOLIC PANEL
ALK PHOS: 75 U/L (ref 38–126)
ALT: 38 U/L (ref 14–54)
ANION GAP: 9 (ref 5–15)
AST: 45 U/L — ABNORMAL HIGH (ref 15–41)
Albumin: 3.9 g/dL (ref 3.5–5.0)
BUN: 14 mg/dL (ref 6–20)
CHLORIDE: 92 mmol/L — AB (ref 101–111)
CO2: 26 mmol/L (ref 22–32)
Calcium: 8.6 mg/dL — ABNORMAL LOW (ref 8.9–10.3)
Creatinine, Ser: 0.82 mg/dL (ref 0.44–1.00)
GFR calc Af Amer: >60 mL/min (ref >60)
GFR calc non Af Amer: >60 mL/min (ref >60)
Glucose, Bld: 333 mg/dL — ABNORMAL HIGH (ref 65–99)
Potassium: 3.8 mmol/L (ref 3.5–5.1)
Sodium: 127 mmol/L — ABNORMAL LOW (ref 135–145)
TOTAL PROTEIN: 7.4 g/dL (ref 6.5–8.1)
Total Bilirubin: 0.6 mg/dL (ref 0.3–1.2)

## 2015-06-27 LAB — CBC WITH DIFFERENTIAL/PLATELET
BASOS PCT: 0 %
Basophils Absolute: 0 10*3/uL (ref 0–0.1)
EOS ABS: 0.3 10*3/uL (ref 0–0.7)
EOS PCT: 3 %
HCT: 38.6 % (ref 35.0–47.0)
Hemoglobin: 12.3 g/dL (ref 12.0–16.0)
LYMPHS ABS: 1.9 10*3/uL (ref 1.0–3.6)
LYMPHS PCT: 21 %
MCH: 26.7 pg (ref 26.0–34.0)
MCHC: 31.9 g/dL — AB (ref 32.0–36.0)
MCV: 83.6 fL (ref 80.0–100.0)
MONOS PCT: 6 %
Monocytes Absolute: 0.5 10*3/uL (ref 0.2–0.9)
NEUTROS ABS: 6.3 10*3/uL (ref 1.4–6.5)
Neutrophils Relative %: 70 %
PLATELETS: 209 10*3/uL (ref 150–440)
RBC: 4.62 MIL/uL (ref 3.80–5.20)
RDW: 17.3 % — ABNORMAL HIGH (ref 11.5–14.5)
WBC: 9.1 10*3/uL (ref 3.6–11.0)

## 2015-06-27 MED ORDER — TRASTUZUMAB CHEMO INJECTION 440 MG
6.0000 mg/kg | Freq: Once | INTRAVENOUS | Status: AC
  Administered 2015-06-27: 399 mg via INTRAVENOUS
  Filled 2015-06-27: qty 19

## 2015-06-27 MED ORDER — SODIUM CHLORIDE 0.9 % IV SOLN
Freq: Once | INTRAVENOUS | Status: AC
  Administered 2015-06-27: 14:00:00 via INTRAVENOUS
  Filled 2015-06-27: qty 1000

## 2015-06-27 MED ORDER — DIPHENHYDRAMINE HCL 25 MG PO CAPS
50.0000 mg | ORAL_CAPSULE | Freq: Once | ORAL | Status: DC
Start: 2015-06-27 — End: 2015-06-27
  Filled 2015-06-27: qty 2

## 2015-06-27 MED ORDER — HEPARIN SOD (PORK) LOCK FLUSH 100 UNIT/ML IV SOLN
500.0000 [IU] | Freq: Once | INTRAVENOUS | Status: AC
  Administered 2015-06-27: 500 [IU] via INTRAVENOUS

## 2015-06-27 MED ORDER — DIPHENHYDRAMINE HCL 25 MG PO TABS
25.0000 mg | ORAL_TABLET | Freq: Once | ORAL | Status: AC
  Administered 2015-06-27: 25 mg via ORAL
  Filled 2015-06-27: qty 1

## 2015-06-27 MED ORDER — SODIUM CHLORIDE 0.9 % IJ SOLN
10.0000 mL | INTRAMUSCULAR | Status: DC | PRN
  Filled 2015-06-27: qty 10

## 2015-06-27 MED ORDER — ACETAMINOPHEN 325 MG PO TABS
650.0000 mg | ORAL_TABLET | Freq: Once | ORAL | Status: AC
  Administered 2015-06-27: 650 mg via ORAL
  Filled 2015-06-27: qty 2

## 2015-06-27 NOTE — Progress Notes (Signed)
[place] @ [facility] [phone]  Fax:([phone]) [place]: 04-16-1946  MR#: [id number]  VXB#:[id number]  Patient Care Team: Guadalupe Maple, MD as PCP - General (Family Medicine) Robert Bellow, MD (General Surgery)  CHIEF COMPLAINT:  Chief Complaint  Patient presents with  . 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  Estrogen receptor 90%.  Progesterone receptor 60%.  Patient had lumpectomy and MammoSite partial breast radiation therapy followed by tamoxifen for 5 years.  Patient is still taking tamoxifen.. 2 recent mammogram in December of 2015 negative.  Further evaluation by surgeon revealed a palpable mass in the left upper outer quadrant near axilla.  Ultrasound revealed one small normal-appearing lymph node 0.6 cm in lower limit of axilla.  Adjacent to this there was ill-defined multilobulated hypoechoic mass 1.8 x 2.6 cm.  Biopsy of the left axillary mass was done which was positive for high-grade carcinoma.hormone receptors are pending  2,estrogen receptor positive.  Progesterone receptor positive.  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 3.  MRI scan of breast (January, 2015) reveals multiple abnormal enlarged left axillary lymph node There are 2 oval some circumscribed mass at 2:30 and 3:00 position of the left breast.  0.6 cm x 1.3 cm and 4 x 7 mm Non-mass enhancement extending 2.4 cm these masses is 3.6 cm mass.  Findings suggestive of several pulmonary nodules.  PET  scan shows extensive disease, 4.started on Taxotere,perjeta, Herceptin(January 03, 2015). 5.  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   12/16/2014 Initial Diagnosis Malignant  neoplasm of axilla    Oncology Flowsheet 04/18/2015 04/21/2015 05/09/2015 06/06/2015  Day, Cycle Day 1, Cycle 6 - - -  dexamethasone (DECADRON) IV [ 12 mg ] - - -  diphenhydrAMINE (BENADRYL) PO - 50 mg 50 mg 50 mg  DOCEtaxel (TAXOTERE) IV 75 mg/m2 - - -  fosaprepitant (EMEND) IV [ 150 mg ] - - -  palonosetron (ALOXI) IV 0.25 mg - - -  pertuzumab (PERJETA) IV - 420 mg - -  trastuzumab (HERCEPTIN) IV - 6 mg/kg 6 mg/kg 6 mg/kg    INTERVAL HISTORY:  69 year old lady with history of carcinoma of breast stage IV disease here for further follow-up regarding Herceptin therapy.  Patient has been started iBRANCE  and letrozole Appetite has been stable.  No chills.  No fever.  Patient had number of questions regarding changing and time hormone therapy. REVIEW OF SYSTEMS:    general status: Patient is feeling weak and tired.  No change in a performance status.  No chills.  No fever. HEENT:   No evidence of stomatitis Lungs: No cough or shortness of breath Cardiac: No chest pain or paroxysmal nocturnal dyspnea GI: No nausea no vomiting no diarrhea no abdominal pain Skin: No rash Lower extremity no swelling Neurological system: No tingling.  No numbness.  No other focal signs Musculoskeletal system no bony pains  As per HPI. Otherwise, a complete review of systems is negatve.  PAST MEDICAL HISTORY: Past Medical History  Diagnosis Date  . Hypertension 2009  . 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 Special screening for malignant  neoplasms, colon   . Hyperlipidemia 1990  . Floaters 2014  . Cancer 2010    right breast ductal carcinoma in situ  . 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 ER 90%, PR 50%, MammoSite partial breast radiation  . Breast cancer 02/21/2009  . 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 Axillary mass  12/15/2014    PAST SURGICAL HISTORY: Past Surgical History  Procedure Laterality Date  . Appendectomy  1956  . Partial hysterectomy  1977  . Colonoscopy  2008    ??? Md  . Breast surgery Right 2010    wide excision  . Breast biopsy Right 2010    stereo  . Left axilla biopsy Left 12-14-14    FAMILY HISTORY Family History  Problem Relation Age of Onset  . Cancer Other     unknown family members with ovarian,colon,breast cancers    ADVANCED DIRECTIVES:  No flowsheet data found.  HEALTH MAINTENANCE: History  Substance Use Topics  . Smoking status: Never Smoker   . Smokeless tobacco: Not on file  . Alcohol Use: No      Allergies  Allergen Reactions  . Penicillin G     Other reaction(s): Pruritic rash  . Sulfa Antibiotics Rash    Other reaction(s): Weal    Current Outpatient Prescriptions  Medication Sig Dispense Refill  . ALPRAZolam (XANAX) 0.5 MG tablet TAKE 1 TABLET BY MOUTH EVERY 8 HOURS AS NEEDED FOR ANXIETY 30 tablet 0  . aspirin 81 MG tablet Take 81 mg by mouth daily.    Marland Kitchen aspirin 81 MG tablet Take by mouth.    . CRESTOR 5 MG tablet Take 5 mg by mouth daily at 6 PM.     . doxycycline (VIBRA-TABS) 100 MG tablet Take 1 tablet (100 mg total) by mouth 2 (two) times daily. 14 tablet 0  . fluconazole (DIFLUCAN) 100 MG tablet Take 1 tablet (100 mg total) by mouth daily. 5 tablet 0  . KLOR-CON M20 20 MEQ tablet TAKE 2 TABLETS BY MOUTH EVERY DAY 60 tablet 0  . letrozole (FEMARA) 2.5 MG tablet Take 1 tablet (2.5 mg total) by mouth daily.    . magnesium oxide (MAG-OX) 400 (241.3 MG) MG tablet Take 1 tablet (400 mg total) by mouth daily. 30 tablet 3  . metFORMIN (GLUCOPHAGE) 500 MG tablet Take 500 mg by mouth 2 (two) times daily with a meal.     . metFORMIN (GLUCOPHAGE) 500 MG tablet Take by mouth.    . ondansetron (ZOFRAN) 4 MG tablet Take 4 mg by mouth every 8 (eight) hours as needed for nausea or vomiting.    . ONE TOUCH ULTRA TEST test strip     . palbociclib  (IBRANCE) 125 MG capsule Take 1 capsule (125 mg total) by mouth daily with breakfast. Take whole with food for 21 days then 7 days off.    . rosuvastatin (CRESTOR) 5 MG tablet Take by mouth.    . valsartan-hydrochlorothiazide (DIOVAN-HCT) 320-25 MG per tablet Take 1 tablet by mouth daily.     . valsartan-hydrochlorothiazide (DIOVAN-HCT) 320-25 MG per tablet Take by mouth.     No current facility-administered medications for this visit.   Facility-Administered Medications Ordered in Other Visits  Medication Dose Route Frequency Provider Last Rate Last Dose  . sodium chloride 0.9 % 250 mL with potassium chloride 40 mEq infusion   Intravenous  Continuous Forest Gleason, MD   Stopped at 05/03/15 1229  . sodium chloride 0.9 % injection 10 mL  10 mL Intracatheter PRN Forest Gleason, MD   10 mL at 04/18/15 1545    OBJECTIVE:  Filed Vitals:   06/27/15 1340  BP: 114/76  Pulse: 102  Temp: 96.6 F (35.9 C)     Body mass index is 26.61 kg/(m^2).    ECOG FS:1 - Symptomatic but completely ambulatory  PHYSICAL EXAM: gENERAL  status: Performance status is good.  Patient has not lost significant weight HEENT: No evidence of stomatitis. Sclera and conjunctivae :: No jaundice.   pale looking. Lungs: Air  entry equal on both sides.  No rhonchi.  No rales.  Cardiac: Heart sounds are normal.  No pericardial rub.  No murmur. Lymphatic system: Cervical, axillary, inguinal, lymph nodes not palpable GI: Abdomen is soft.  No ascites.  Liver spleen not palpable.  No tenderness.  Bowel sounds are within normal limit Lower extremity: No edema Neurological system: Higher functions, cranial nerves intact no evidence of peripheral neuropathy. Skin: No rash.  No ecchymosis.Clancy Gourd of right breast no evidence of palpable mass.  Right axillary area no palpable mass.    LEFT BREAST .  FREE  of masses LAB RESULTS:  Infusion on 06/27/2015  Component Date Value Ref Range Status  . WBC 06/27/2015 9.1  3.6 - 11.0 K/uL  Final   A-LINE DRAW  . RBC 06/27/2015 4.62  3.80 - 5.20 MIL/uL Final  . Hemoglobin 06/27/2015 12.3  12.0 - 16.0 g/dL Final  . HCT 06/27/2015 38.6  35.0 - 47.0 % Final  . MCV 06/27/2015 83.6  80.0 - 100.0 fL Final  . MCH 06/27/2015 26.7  26.0 - 34.0 pg Final  . MCHC 06/27/2015 31.9* 32.0 - 36.0 g/dL Final  . RDW 06/27/2015 17.3* 11.5 - 14.5 % Final  . Platelets 06/27/2015 209  150 - 440 K/uL Final  . Neutrophils Relative % 06/27/2015 70   Final  . Neutro Abs 06/27/2015 6.3  1.4 - 6.5 K/uL Final  . Lymphocytes Relative 06/27/2015 21   Final  . Lymphs Abs 06/27/2015 1.9  1.0 - 3.6 K/uL Final  . Monocytes Relative 06/27/2015 6   Final  . Monocytes Absolute 06/27/2015 0.5  0.2 - 0.9 K/uL Final  . Eosinophils Relative 06/27/2015 3   Final  . Eosinophils Absolute 06/27/2015 0.3  0 - 0.7 K/uL Final  . Basophils Relative 06/27/2015 0   Final  . Basophils Absolute 06/27/2015 0.0  0 - 0.1 K/uL Final  . Sodium 06/27/2015 127* 135 - 145 mmol/L Final  . Potassium 06/27/2015 3.8  3.5 - 5.1 mmol/L Final  . Chloride 06/27/2015 92* 101 - 111 mmol/L Final  . CO2 06/27/2015 26  22 - 32 mmol/L Final  . Glucose, Bld 06/27/2015 333* 65 - 99 mg/dL Final  . BUN 06/27/2015 14  6 - 20 mg/dL Final  . Creatinine, Ser 06/27/2015 0.82  0.44 - 1.00 mg/dL Final  . Calcium 06/27/2015 8.6* 8.9 - 10.3 mg/dL Final  . Total Protein 06/27/2015 7.4  6.5 - 8.1 g/dL Final  . Albumin 06/27/2015 3.9  3.5 - 5.0 g/dL Final  . AST 06/27/2015 45* 15 - 41 U/L Final  . ALT 06/27/2015 38  14 - 54 U/L Final  . Alkaline Phosphatase 06/27/2015 75  38 - 126 U/L Final  . Total Bilirubin 06/27/2015 0.6  0.3 - 1.2 mg/dL Final  . GFR calc non Af Amer 06/27/2015 >60  >  60 mL/min Final  . GFR calc Af Amer 06/27/2015 >60  >60 mL/min Final   Comment: (NOTE) The eGFR has been calculated using the CKD EPI equation. This calculation has not been validated in all clinical situations. eGFR's persistently <60 mL/min signify possible Chronic  Kidney Disease.   . Anion gap 06/27/2015 9  5 - 15 Final      STUDIES: Nm Pet Image Restag (ps) Skull Base To Thigh  06/03/2015   CLINICAL DATA:  Subsequent treatment strategy for bilateral breast cancer, most recently diagnosed in the axillary tail 12/16/2014. Chemotherapy 3.5 weeks ago.  EXAM: NUCLEAR MEDICINE PET SKULL BASE TO THIGH  TECHNIQUE: 12.73 mCi F-18 FDG was injected intravenously. Full-ring PET imaging was performed from the skull base to thigh after the radiotracer. CT data was obtained and used for attenuation correction and anatomic localization.  FASTING BLOOD GLUCOSE:  Value: 164 mg/dl  COMPARISON:  PET-CT 03/02/2015.  FINDINGS: NECK  No hypermetabolic cervical lymph nodes are identified.There are no lesions of the pharyngeal mucosal space. There is a stable probable 1.9 cm sebaceous cyst in the right lateral neck on image 36, without associated metabolic activity.  CHEST  No abnormal breast activity identified. There is continued improvement in the left axillary lymph node which measures 5 mm short axis on image 77 (previously 8 mm). This demonstrates minimal metabolic activity with an SUV max of 1.7 (previously 3.7). No other hypermetabolic axillary, mediastinal, internal mammary or hilar lymph nodes demonstrated. There is no hypermetabolic pulmonary activity. No discrete pulmonary nodules demonstrated. There are peripheral fibrotic changes in both lungs. There are stable calcifications within the right breast. Right subclavian Port-A-Cath extends to the SVC right atrial junction.  ABDOMEN/PELVIS  There is no hypermetabolic activity within the liver, adrenal glands, spleen or pancreas. There is no hypermetabolic nodal activity. Morphologic changes of cirrhosis noted within the liver.  SKELETON  No hypermetabolic osseous metastases demonstrated. Diffuse sclerotic metastases are grossly stable without evidence of pathologic fracture or epidural tumor.  IMPRESSION: 1. Near-complete  resolution of residual left axillary nodal activity. No hypermetabolic distant metastases demonstrated. 2. Stable chronic sclerotic osseous metastases, likely treated. 3. No new findings. 4. 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 Stable morphologic changes of cirrhosis.   Electronically Signed   By: Richardean Sale M.D.   On: 06/03/2015 11:45    ASSESSMENT: STAGE 4  carcinoma of breast good response to chemotherapy on maintenance Herceptin therapy Started on letrozole and  IBRANCE  Again told patient that we need to get her to see surgeon for possibility of local therapy but patient adamantly refuses.   MEDICAL DECISION MAKING:  Patient had number of questions regarding changing anti-hormonal therapy Continue Herceptin therapy MUGA scan of the heart will be done Considering extensive bone metastases we will try to get approval for either Zometa or XGEVA therapy starting from next cycle of treatment Total duration of visit was 45 minutes.  50% or more time was spent in counseling patient and family regarding prognosis and options of treatment and available resources  Patient expressed understanding and was in agreement with this plan. She also understands that She can call clinic at any time with any questions, concerns, or complaints.    Breast cancer   Staging form: Breast, AJCC 7th Edition     Clinical stage from 04/06/2015: Stage IV (T2, N2, M1) - Signed by Evlyn Kanner, NP on 04/06/2015   Forest Gleason, MD   06/27/2015 2:00 PM

## 2015-06-27 NOTE — Progress Notes (Signed)
Pt does not have living will.  Declined information. Never smoked.

## 2015-06-29 ENCOUNTER — Telehealth: Payer: Self-pay | Admitting: *Deleted

## 2015-06-29 NOTE — Telephone Encounter (Signed)
Instructed her to call back if occurs again. She reports that her med is to be delivered today

## 2015-06-29 NOTE — Telephone Encounter (Signed)
Patient called asking about MUGA scan.  She received appointment today in the mail and does not know what the test is.  Asking that a nurse call her back.    Called patient back to inform her about MUGA scan. Explained that she has to have it done due to her receiving Herceptin.  She also had one done in February.  Patient verbalized understanding.

## 2015-07-12 ENCOUNTER — Encounter
Admission: RE | Admit: 2015-07-12 | Discharge: 2015-07-12 | Disposition: A | Discharge status: Home / Self Care | Payer: Medicare Other | Source: Ambulatory Visit | Attending: Oncology | Admitting: Oncology

## 2015-07-12 DIAGNOSIS — C50919 Malignant neoplasm of unspecified site of unspecified female breast: Secondary | ICD-10-CM | POA: Diagnosis not present

## 2015-07-12 DIAGNOSIS — I429 Cardiomyopathy, unspecified: Secondary | ICD-10-CM | POA: Diagnosis not present

## 2015-07-12 DIAGNOSIS — Z0389 Encounter for observation for other suspected diseases and conditions ruled out: Secondary | ICD-10-CM | POA: Diagnosis not present

## 2015-07-12 MED ORDER — TECHNETIUM TC 99M-LABELED RED BLOOD CELLS IV KIT
22.0000 | PACK | Freq: Once | INTRAVENOUS | Status: AC | PRN
  Administered 2015-07-12: 22.65 via INTRAVENOUS

## 2015-07-13 ENCOUNTER — Telehealth: Payer: Self-pay | Admitting: Unknown Physician Specialty

## 2015-07-18 ENCOUNTER — Inpatient Hospital Stay: Payer: Medicare Other | Attending: Oncology

## 2015-07-18 ENCOUNTER — Inpatient Hospital Stay: Payer: Medicare Other

## 2015-07-18 ENCOUNTER — Inpatient Hospital Stay (HOSPITAL_BASED_OUTPATIENT_CLINIC_OR_DEPARTMENT_OTHER): Payer: Medicare Other | Admitting: Oncology

## 2015-07-18 ENCOUNTER — Encounter: Payer: Self-pay | Admitting: Oncology

## 2015-07-18 VITALS — BP 130/83 | HR 86 | Temp 96.3°F | Wt 142.0 lb

## 2015-07-18 DIAGNOSIS — Z7982 Long term (current) use of aspirin: Secondary | ICD-10-CM | POA: Diagnosis not present

## 2015-07-18 DIAGNOSIS — Z853 Personal history of malignant neoplasm of breast: Secondary | ICD-10-CM | POA: Diagnosis not present

## 2015-07-18 DIAGNOSIS — Z79899 Other long term (current) drug therapy: Secondary | ICD-10-CM

## 2015-07-18 DIAGNOSIS — Z923 Personal history of irradiation: Secondary | ICD-10-CM

## 2015-07-18 DIAGNOSIS — R531 Weakness: Secondary | ICD-10-CM | POA: Insufficient documentation

## 2015-07-18 DIAGNOSIS — Z9049 Acquired absence of other specified parts of digestive tract: Secondary | ICD-10-CM

## 2015-07-18 DIAGNOSIS — R5383 Other fatigue: Secondary | ICD-10-CM | POA: Diagnosis not present

## 2015-07-18 DIAGNOSIS — Z170 Estrogen receptor positive status [ER+]: Secondary | ICD-10-CM | POA: Insufficient documentation

## 2015-07-18 DIAGNOSIS — Z90710 Acquired absence of both cervix and uterus: Secondary | ICD-10-CM | POA: Diagnosis not present

## 2015-07-18 DIAGNOSIS — E785 Hyperlipidemia, unspecified: Secondary | ICD-10-CM | POA: Diagnosis not present

## 2015-07-18 DIAGNOSIS — C50912 Malignant neoplasm of unspecified site of left female breast: Secondary | ICD-10-CM

## 2015-07-18 DIAGNOSIS — C50412 Malignant neoplasm of upper-outer quadrant of left female breast: Secondary | ICD-10-CM | POA: Diagnosis not present

## 2015-07-18 DIAGNOSIS — C50919 Malignant neoplasm of unspecified site of unspecified female breast: Secondary | ICD-10-CM

## 2015-07-18 DIAGNOSIS — Z79811 Long term (current) use of aromatase inhibitors: Secondary | ICD-10-CM | POA: Insufficient documentation

## 2015-07-18 DIAGNOSIS — C773 Secondary and unspecified malignant neoplasm of axilla and upper limb lymph nodes: Secondary | ICD-10-CM | POA: Insufficient documentation

## 2015-07-18 DIAGNOSIS — Z5111 Encounter for antineoplastic chemotherapy: Secondary | ICD-10-CM | POA: Insufficient documentation

## 2015-07-18 DIAGNOSIS — C50619 Malignant neoplasm of axillary tail of unspecified female breast: Secondary | ICD-10-CM

## 2015-07-18 LAB — COMPREHENSIVE METABOLIC PANEL
ALT: 32 U/L (ref 14–54)
AST: 30 U/L (ref 15–41)
Albumin: 4.3 g/dL (ref 3.5–5.0)
Alkaline Phosphatase: 62 U/L (ref 38–126)
Anion gap: 6 (ref 5–15)
BUN: 10 mg/dL (ref 6–20)
CALCIUM: 8.9 mg/dL (ref 8.9–10.3)
CO2: 29 mmol/L (ref 22–32)
CREATININE: 0.74 mg/dL (ref 0.44–1.00)
Chloride: 96 mmol/L — ABNORMAL LOW (ref 101–111)
GFR calc Af Amer: >60 mL/min (ref >60)
GFR calc non Af Amer: >60 mL/min (ref >60)
GLUCOSE: 167 mg/dL — AB (ref 65–99)
Potassium: 3.9 mmol/L (ref 3.5–5.1)
Sodium: 131 mmol/L — ABNORMAL LOW (ref 135–145)
TOTAL PROTEIN: 7.8 g/dL (ref 6.5–8.1)
Total Bilirubin: 0.7 mg/dL (ref 0.3–1.2)

## 2015-07-18 LAB — CBC WITH DIFFERENTIAL/PLATELET
BASOS ABS: 0 10*3/uL (ref 0–0.1)
BASOS PCT: 1 %
Eosinophils Absolute: 0.1 10*3/uL (ref 0–0.7)
Eosinophils Relative: 2 %
HEMATOCRIT: 36.9 % (ref 35.0–47.0)
HEMOGLOBIN: 11.8 g/dL — AB (ref 12.0–16.0)
LYMPHS ABS: 1.4 10*3/uL (ref 1.0–3.6)
Lymphocytes Relative: 43 %
MCH: 27.2 pg (ref 26.0–34.0)
MCHC: 32.1 g/dL (ref 32.0–36.0)
MCV: 84.5 fL (ref 80.0–100.0)
MONOS PCT: 4 %
Monocytes Absolute: 0.1 10*3/uL — ABNORMAL LOW (ref 0.2–0.9)
NEUTROS ABS: 1.7 10*3/uL (ref 1.4–6.5)
Neutrophils Relative %: 50 %
Platelets: 88 10*3/uL — ABNORMAL LOW (ref 150–440)
RBC: 4.36 MIL/uL (ref 3.80–5.20)
RDW: 17.1 % — AB (ref 11.5–14.5)
WBC: 3.4 10*3/uL — ABNORMAL LOW (ref 3.6–11.0)

## 2015-07-18 MED ORDER — TRASTUZUMAB CHEMO INJECTION 440 MG
6.0000 mg/kg | Freq: Once | INTRAVENOUS | Status: AC
  Administered 2015-07-18: 399 mg via INTRAVENOUS
  Filled 2015-07-18: qty 19

## 2015-07-18 MED ORDER — ACETAMINOPHEN 325 MG PO TABS
650.0000 mg | ORAL_TABLET | Freq: Once | ORAL | Status: AC
  Administered 2015-07-18: 650 mg via ORAL
  Filled 2015-07-18: qty 2

## 2015-07-18 MED ORDER — DIPHENHYDRAMINE HCL 25 MG PO CAPS
50.0000 mg | ORAL_CAPSULE | Freq: Once | ORAL | Status: AC
  Administered 2015-07-18: 50 mg via ORAL
  Filled 2015-07-18: qty 2

## 2015-07-18 MED ORDER — HEPARIN SOD (PORK) LOCK FLUSH 100 UNIT/ML IV SOLN
500.0000 [IU] | Freq: Once | INTRAVENOUS | Status: AC | PRN
  Administered 2015-07-18: 500 [IU]

## 2015-07-18 MED ORDER — DENOSUMAB 120 MG/1.7ML ~~LOC~~ SOLN
120.0000 mg | Freq: Once | SUBCUTANEOUS | Status: AC
  Administered 2015-07-18: 120 mg via SUBCUTANEOUS
  Filled 2015-07-18: qty 1.7

## 2015-07-18 MED ORDER — SODIUM CHLORIDE 0.9 % IV SOLN
Freq: Once | INTRAVENOUS | Status: AC
  Administered 2015-07-18: 16:00:00 via INTRAVENOUS
  Filled 2015-07-18: qty 1000

## 2015-07-18 NOTE — Progress Notes (Signed)
Patient does not have living will.  Never smoked. 

## 2015-07-19 ENCOUNTER — Telehealth: Payer: Self-pay | Admitting: *Deleted

## 2015-07-19 LAB — CANCER ANTIGEN 27.29: CA 27.29: 51.7 U/mL — ABNORMAL HIGH (ref 0.0–38.6)

## 2015-07-19 NOTE — Telephone Encounter (Signed)
I advised her it would be ok to take doxycycline and to call me back on Friday to let me know how her toes are doing. I also educated her on the fact that she needs to complete all her rx of abx when given to her

## 2015-07-21 ENCOUNTER — Other Ambulatory Visit: Payer: Self-pay | Admitting: Oncology

## 2015-07-22 ENCOUNTER — Telehealth: Payer: Self-pay | Admitting: *Deleted

## 2015-07-22 NOTE — Telephone Encounter (Signed)
Patient received a letter regarding copay assistance.  She is not understanding what to do.  Read the letter to me. Advised patient to call number listed on letter and she will be instructed as to how they will be able to help her.  Patient verbalized understanding.

## 2015-07-30 ENCOUNTER — Encounter: Payer: Self-pay | Admitting: Oncology

## 2015-07-30 NOTE — Progress Notes (Signed)
[place] @ [facility] Telephone:(336) [phone]  Fax:([phone]) [place]: August 04, 1946  MR#: [id number]  NUU#:[id number]  Patient Care Team: Guadalupe Maple, MD as PCP - General (Family Medicine) Robert Bellow, MD (General Surgery)  CHIEF COMPLAINT:  Chief Complaint  Patient presents with  . 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  Estrogen receptor 90%.  Progesterone receptor 60%.  Patient had lumpectomy and MammoSite partial breast radiation therapy followed by tamoxifen for 5 years.  Patient is still taking tamoxifen.. 2 recent mammogram in December of 2015 negative.  Further evaluation by surgeon revealed a palpable mass in the left upper outer quadrant near axilla.  Ultrasound revealed one small normal-appearing lymph node 0.6 cm in lower limit of axilla.  Adjacent to this there was ill-defined multilobulated hypoechoic mass 1.8 x 2.6 cm.  Biopsy of the left axillary mass was done which was positive for high-grade carcinoma.hormone receptors are pending  2,estrogen receptor positive.  Progesterone receptor positive.  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 3.  MRI scan of breast (January, 2015) reveals multiple abnormal enlarged left axillary lymph node There are 2 oval some circumscribed mass at 2:30 and 3:00 position of the left breast.  0.6 cm x 1.3 cm and 4 x 7 mm Non-mass enhancement extending 2.4 cm these masses is 3.6 cm mass.  Findings suggestive of several pulmonary nodules.  PET  scan shows extensive disease, 4.started on Taxotere,perjeta, Herceptin(January 03, 2015). 5.  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   12/16/2014 Initial Diagnosis Malignant  neoplasm of axilla    Oncology Flowsheet 04/18/2015 04/21/2015 05/09/2015 06/06/2015 06/27/2015 07/18/2015  Day, Cycle Day 1, Cycle 6 - - - - -  denosumab (XGEVA) [place] - - - - - 120 mg  dexamethasone (DECADRON) IV [ 12 mg ] - - - - -  diphenhydrAMINE (BENADRYL) PO - 50 mg 50 mg 50 mg - 50 mg  DOCEtaxel (TAXOTERE) IV 75 mg/m2 - - - - -  fosaprepitant (EMEND) IV [ 150 mg ] - - - - -  palonosetron (ALOXI) IV 0.25 mg - - - - -  pertuzumab (PERJETA) IV - 420 mg - - - -  trastuzumab (HERCEPTIN) IV - 6 mg/kg 6 mg/kg 6 mg/kg 6 mg/kg 6 mg/kg    INTERVAL HISTORY:  69 year old lady with history of carcinoma of breast stage IV disease here for further follow-up regarding Herceptin therapy.  Patient has been started iBRANCE  and letrozole Appetite has been stable.  No chills.  No fever.  Patient had number of questions regarding changing and time hormone therapy. August, 2016 Patient is here for continuation of treatment with Herceptin.  Patient finally received IBRANCE and taking it along with letrozole and tolerating very well.  No cough or shortness of breath appetite has been stable. REVIEW OF SYSTEMS:    general status: Patient is feeling weak and tired.  No change in a performance status.  No chills.  No fever. HEENT:   No evidence of stomatitis Lungs: No cough or shortness of breath Cardiac: No chest pain or paroxysmal nocturnal dyspnea GI: No nausea no vomiting no diarrhea no abdominal pain  Skin: No rash Lower extremity no swelling Neurological system: No tingling.  No numbness.  No other focal signs Musculoskeletal system no bony pains  As per HPI. Otherwise, a complete review of systems is negatve.  PAST MEDICAL HISTORY: Past Medical History  Diagnosis Date  . Hypertension 2009  . 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 Special screening for malignant neoplasms, colon   . Hyperlipidemia 1990  . Floaters 2014  . Cancer 2010    right breast ductal carcinoma in  situ  . 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 ER 90%, PR 50%, MammoSite partial breast radiation  . Breast cancer 02/21/2009  . 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 Axillary mass 12/15/2014    PAST SURGICAL HISTORY: Past Surgical History  Procedure Laterality Date  . Appendectomy  1956  . Partial hysterectomy  1977  . Colonoscopy  2008    ??? Md  . Breast surgery Right 2010    wide excision  . Breast biopsy Right 2010    stereo  . Left axilla biopsy Left 12-14-14    FAMILY HISTORY Family History  Problem Relation Age of Onset  . Cancer Other     unknown family members with ovarian,colon,breast cancers    ADVANCED DIRECTIVES:  No flowsheet data found.  HEALTH MAINTENANCE: Social History  Substance Use Topics  . Smoking status: Never Smoker   . Smokeless tobacco: None  . Alcohol Use: No      Allergies  Allergen Reactions  . Penicillin G     Other reaction(s): Pruritic rash  . Sulfa Antibiotics Rash    Other reaction(s): Weal    Current Outpatient Prescriptions  Medication Sig Dispense Refill  . aspirin 81 MG tablet Take 81 mg by mouth daily.    . CRESTOR 5 MG tablet Take 5 mg by mouth daily at 6 PM.     . KLOR-CON M20 20 MEQ tablet TAKE 2 TABLETS BY MOUTH EVERY DAY 60 tablet 0  . letrozole (FEMARA) 2.5 MG tablet Take 1 tablet (2.5 mg total) by mouth daily.    . magnesium oxide (MAG-OX) 400 (241.3 MG) MG tablet Take 1 tablet (400 mg total) by mouth daily. 30 tablet 3  . metFORMIN (GLUCOPHAGE) 500 MG tablet Take 500 mg by mouth 2 (two) times daily with a meal.     . ondansetron (ZOFRAN) 4 MG tablet Take 4 mg by mouth every 8 (eight) hours as needed for nausea or vomiting.    . ONE TOUCH ULTRA TEST test strip     . palbociclib (IBRANCE) 125 MG capsule Take 1 capsule (125 mg total) by mouth daily with breakfast. Take whole  with food for 21 days then 7 days off.    . rosuvastatin (CRESTOR) 5 MG tablet Take by mouth.    . valsartan-hydrochlorothiazide (DIOVAN-HCT) 320-25 MG per tablet Take 1 tablet by mouth daily.     Marland Kitchen ALPRAZolam (XANAX) 0.5 MG tablet TAKE 1 TABLET BY MOUTH 3 TIMES A DAY AS NEEDED 90 tablet 0  . aspirin 81 MG tablet Take by mouth.    . doxycycline (VIBRA-TABS) 100 MG tablet Take 1 tablet (100 mg total) by mouth 2 (two) times daily. (Patient not taking: Reported on 07/18/2015) 14 tablet 0  . fluconazole (DIFLUCAN) 100 MG tablet Take 1 tablet (100 mg total) by mouth  daily. (Patient not taking: Reported on 07/18/2015) 5 tablet 0  . metFORMIN (GLUCOPHAGE) 500 MG tablet Take by mouth.    . valsartan-hydrochlorothiazide (DIOVAN-HCT) 320-25 MG per tablet Take by mouth.     No current facility-administered medications for this visit.   Facility-Administered Medications Ordered in Other Visits  Medication Dose Route Frequency Provider Last Rate Last Dose  . sodium chloride 0.9 % 250 mL with potassium chloride 40 mEq infusion   Intravenous Continuous Forest Gleason, MD   Stopped at 05/03/15 1229  . sodium chloride 0.9 % injection 10 mL  10 mL Intracatheter PRN Forest Gleason, MD   10 mL at 04/18/15 1545    OBJECTIVE:  Filed Vitals:   07/18/15 1420  BP: 130/83  Pulse: 86  Temp: 96.3 F (35.7 C)     Body mass index is 26.13 kg/(m^2).    ECOG FS:1 - Symptomatic but completely ambulatory  PHYSICAL EXAM: gENERAL  status: Performance status is good.  Patient has not lost significant weight HEENT: No evidence of stomatitis. Sclera and conjunctivae :: No jaundice.   pale looking. Lungs: Air  entry equal on both sides.  No rhonchi.  No rales.  Cardiac: Heart sounds are normal.  No pericardial rub.  No murmur. Lymphatic system: Cervical, axillary, inguinal, lymph nodes not palpable GI: Abdomen is soft.  No ascites.  Liver spleen not palpable.  No tenderness.  Bowel sounds are within normal limit Lower extremity:  No edema Neurological system: Higher functions, cranial nerves intact no evidence of peripheral neuropathy. Skin: No rash.  No ecchymosis.Clancy Gourd of right breast no evidence of palpable mass.  Right axillary area no palpable mass.    LEFT BREAST .  FREE  of masses LAB RESULTS:  Infusion on 07/18/2015  Component Date Value Ref Range Status  . WBC 07/18/2015 3.4* 3.6 - 11.0 K/uL Final  . RBC 07/18/2015 4.36  3.80 - 5.20 MIL/uL Final  . Hemoglobin 07/18/2015 11.8* 12.0 - 16.0 g/dL Final  . HCT 07/18/2015 36.9  35.0 - 47.0 % Final  . MCV 07/18/2015 84.5  80.0 - 100.0 fL Final  . MCH 07/18/2015 27.2  26.0 - 34.0 pg Final  . MCHC 07/18/2015 32.1  32.0 - 36.0 g/dL Final  . RDW 07/18/2015 17.1* 11.5 - 14.5 % Final  . Platelets 07/18/2015 88* 150 - 440 K/uL Final  . Neutrophils Relative % 07/18/2015 50   Final  . Neutro Abs 07/18/2015 1.7  1.4 - 6.5 K/uL Final  . Lymphocytes Relative 07/18/2015 43   Final  . Lymphs Abs 07/18/2015 1.4  1.0 - 3.6 K/uL Final  . Monocytes Relative 07/18/2015 4   Final  . Monocytes Absolute 07/18/2015 0.1* 0.2 - 0.9 K/uL Final  . Eosinophils Relative 07/18/2015 2   Final  . Eosinophils Absolute 07/18/2015 0.1  0 - 0.7 K/uL Final  . Basophils Relative 07/18/2015 1   Final  . Basophils Absolute 07/18/2015 0.0  0 - 0.1 K/uL Final  . Sodium 07/18/2015 131* 135 - 145 mmol/L Final  . Potassium 07/18/2015 3.9  3.5 - 5.1 mmol/L Final  . Chloride 07/18/2015 96* 101 - 111 mmol/L Final  . CO2 07/18/2015 29  22 - 32 mmol/L Final  . Glucose, Bld 07/18/2015 167* 65 - 99 mg/dL Final  . BUN 07/18/2015 10  6 - 20 mg/dL Final  . Creatinine, Ser 07/18/2015 0.74  0.44 - 1.00 mg/dL Final  . Calcium 07/18/2015 8.9  8.9 - 10.3 mg/dL Final  . Total Protein  07/18/2015 7.8  6.5 - 8.1 g/dL Final  . Albumin 07/18/2015 4.3  3.5 - 5.0 g/dL Final  . AST 07/18/2015 30  15 - 41 U/L Final  . ALT 07/18/2015 32  14 - 54 U/L Final  . Alkaline Phosphatase 07/18/2015 62  38 - 126 U/L Final  .  Total Bilirubin 07/18/2015 0.7  0.3 - 1.2 mg/dL Final  . GFR calc non Af Amer 07/18/2015 >60  >60 mL/min Final  . GFR calc Af Amer 07/18/2015 >60  >60 mL/min Final   Comment: (NOTE) The eGFR has been calculated using the CKD EPI equation. This calculation has not been validated in all clinical situations. eGFR's persistently <60 mL/min signify possible Chronic Kidney Disease.   . Anion gap 07/18/2015 6  5 - 15 Final  . CA 27.29 07/18/2015 51.7* 0.0 - 38.6 U/mL Final   Comment: (NOTE) Bayer Centaur/ACS methodology Performed At: [facility] [place], [place] [id number] Lindon Romp MD KG:[id number]       STUDIES: Nm Cardiac Muga Rest  07/12/2015   CLINICAL DATA:  High risk chemotherapy. Evaluate rejection fraction.  EXAM: NUCLEAR MEDICINE CARDIAC BLOOD POOL IMAGING (MUGA)  TECHNIQUE: Cardiac multi-gated acquisition was performed at rest following intravenous injection of Tc-70mlabeled red blood cells.  RADIOPHARMACEUTICALS:  22.65 MCi Tc-943mDP in-vitro labeled red blood cells IV  COMPARISON:  12/30/2014  FINDINGS: The left ventricular ejection fraction equals 63.3%. Previously 67%. Normal left ventricular wall motion.  IMPRESSION: Left ventricular ejection fraction equals 63.3%. Not significantly changed from previous exam.   Electronically Signed   By: TaKerby Moors.D.   On: 07/12/2015 12:02    ASSESSMENT: STAGE 4  carcinoma of breast good response to chemotherapy on maintenance Herceptin therapy Started on letrozole and  IBRANCE  Again told patient that we need to get her to see surgeon for possibility of local therapy but patient adamantly refuses. MUGA scan of the heart on July 2 016 stable ejection fraction  MEDICAL DECISION MAKING:  Patient had number of questions regarding changing anti-hormonal therapy Continue Herceptin therapy MUGA scan of the heart has been stable Continue Herceptin Letrozole and IBRANCE Total duration of visit was 30   minutes.  50% or more time was spent in counseling patient and family regarding prognosis and options of treatment and available resources  Patient expressed understanding and was in agreement with this plan. She also understands that She can call clinic at any time with any questions, concerns, or complaints.    Breast cancer   Staging form: Breast, AJCC 7th Edition     Clinical stage from 04/06/2015: Stage IV (T2, N2, M1) - Signed by LeEvlyn KannerNP on 04/06/2015   JaForest GleasonMD   07/30/2015 5:58 PM

## 2015-08-15 ENCOUNTER — Inpatient Hospital Stay (HOSPITAL_BASED_OUTPATIENT_CLINIC_OR_DEPARTMENT_OTHER): Payer: Medicare Other | Admitting: Oncology

## 2015-08-15 ENCOUNTER — Inpatient Hospital Stay: Payer: Medicare Other

## 2015-08-15 VITALS — BP 152/83 | HR 90 | Temp 95.3°F | Wt 142.1 lb

## 2015-08-15 DIAGNOSIS — C50619 Malignant neoplasm of axillary tail of unspecified female breast: Secondary | ICD-10-CM

## 2015-08-15 DIAGNOSIS — C50919 Malignant neoplasm of unspecified site of unspecified female breast: Secondary | ICD-10-CM

## 2015-08-15 DIAGNOSIS — Z923 Personal history of irradiation: Secondary | ICD-10-CM

## 2015-08-15 DIAGNOSIS — C50612 Malignant neoplasm of axillary tail of left female breast: Secondary | ICD-10-CM

## 2015-08-15 DIAGNOSIS — R5383 Other fatigue: Secondary | ICD-10-CM

## 2015-08-15 DIAGNOSIS — Z9049 Acquired absence of other specified parts of digestive tract: Secondary | ICD-10-CM

## 2015-08-15 DIAGNOSIS — C773 Secondary and unspecified malignant neoplasm of axilla and upper limb lymph nodes: Secondary | ICD-10-CM

## 2015-08-15 DIAGNOSIS — Z90710 Acquired absence of both cervix and uterus: Secondary | ICD-10-CM

## 2015-08-15 DIAGNOSIS — R531 Weakness: Secondary | ICD-10-CM

## 2015-08-15 DIAGNOSIS — Z79899 Other long term (current) drug therapy: Secondary | ICD-10-CM | POA: Diagnosis not present

## 2015-08-15 DIAGNOSIS — Z170 Estrogen receptor positive status [ER+]: Secondary | ICD-10-CM

## 2015-08-15 DIAGNOSIS — Z7982 Long term (current) use of aspirin: Secondary | ICD-10-CM

## 2015-08-15 DIAGNOSIS — Z5111 Encounter for antineoplastic chemotherapy: Secondary | ICD-10-CM | POA: Diagnosis not present

## 2015-08-15 DIAGNOSIS — E785 Hyperlipidemia, unspecified: Secondary | ICD-10-CM

## 2015-08-15 DIAGNOSIS — Z853 Personal history of malignant neoplasm of breast: Secondary | ICD-10-CM

## 2015-08-15 DIAGNOSIS — C50412 Malignant neoplasm of upper-outer quadrant of left female breast: Secondary | ICD-10-CM

## 2015-08-15 DIAGNOSIS — C50912 Malignant neoplasm of unspecified site of left female breast: Secondary | ICD-10-CM

## 2015-08-15 DIAGNOSIS — Z79811 Long term (current) use of aromatase inhibitors: Secondary | ICD-10-CM

## 2015-08-15 LAB — CBC WITH DIFFERENTIAL/PLATELET
Basophils Absolute: 0 10*3/uL (ref 0–0.1)
Basophils Relative: 1 %
EOS PCT: 2 %
Eosinophils Absolute: 0.1 10*3/uL (ref 0–0.7)
HEMATOCRIT: 35.6 % (ref 35.0–47.0)
Hemoglobin: 11.9 g/dL — ABNORMAL LOW (ref 12.0–16.0)
LYMPHS ABS: 1.4 10*3/uL (ref 1.0–3.6)
LYMPHS PCT: 34 %
MCH: 29 pg (ref 26.0–34.0)
MCHC: 33.3 g/dL (ref 32.0–36.0)
MCV: 87.2 fL (ref 80.0–100.0)
Monocytes Absolute: 0.2 10*3/uL (ref 0.2–0.9)
Monocytes Relative: 5 %
NEUTROS ABS: 2.5 10*3/uL (ref 1.4–6.5)
Neutrophils Relative %: 58 %
Platelets: 103 10*3/uL — ABNORMAL LOW (ref 150–440)
RBC: 4.09 MIL/uL (ref 3.80–5.20)
RDW: 23.8 % — ABNORMAL HIGH (ref 11.5–14.5)
WBC: 4.2 10*3/uL (ref 3.6–11.0)

## 2015-08-15 LAB — COMPREHENSIVE METABOLIC PANEL
ALK PHOS: 66 U/L (ref 38–126)
ALT: 27 U/L (ref 14–54)
AST: 28 U/L (ref 15–41)
Albumin: 4.4 g/dL (ref 3.5–5.0)
Anion gap: 6 (ref 5–15)
BUN: 13 mg/dL (ref 6–20)
CALCIUM: 8.7 mg/dL — AB (ref 8.9–10.3)
CO2: 28 mmol/L (ref 22–32)
CREATININE: 0.84 mg/dL (ref 0.44–1.00)
Chloride: 100 mmol/L — ABNORMAL LOW (ref 101–111)
Glucose, Bld: 194 mg/dL — ABNORMAL HIGH (ref 65–99)
Potassium: 3.5 mmol/L (ref 3.5–5.1)
Sodium: 134 mmol/L — ABNORMAL LOW (ref 135–145)
Total Bilirubin: 0.6 mg/dL (ref 0.3–1.2)
Total Protein: 7.7 g/dL (ref 6.5–8.1)

## 2015-08-15 MED ORDER — HEPARIN SOD (PORK) LOCK FLUSH 100 UNIT/ML IV SOLN
500.0000 [IU] | Freq: Once | INTRAVENOUS | Status: AC
  Administered 2015-08-15: 500 [IU] via INTRAVENOUS

## 2015-08-15 MED ORDER — TRASTUZUMAB CHEMO INJECTION 440 MG
6.0000 mg/kg | Freq: Once | INTRAVENOUS | Status: AC
  Administered 2015-08-15: 399 mg via INTRAVENOUS
  Filled 2015-08-15: qty 19

## 2015-08-15 MED ORDER — DIPHENHYDRAMINE HCL 25 MG PO CAPS
50.0000 mg | ORAL_CAPSULE | Freq: Once | ORAL | Status: AC
  Administered 2015-08-15: 50 mg via ORAL
  Filled 2015-08-15: qty 2

## 2015-08-15 MED ORDER — ACETAMINOPHEN 325 MG PO TABS
650.0000 mg | ORAL_TABLET | Freq: Once | ORAL | Status: AC
  Administered 2015-08-15: 650 mg via ORAL
  Filled 2015-08-15: qty 2

## 2015-08-15 MED ORDER — SODIUM CHLORIDE 0.9 % IJ SOLN
10.0000 mL | INTRAMUSCULAR | Status: DC | PRN
Start: 2015-08-15 — End: 2015-08-15
  Filled 2015-08-15: qty 10

## 2015-08-15 MED ORDER — HEPARIN SOD (PORK) LOCK FLUSH 100 UNIT/ML IV SOLN
INTRAVENOUS | Status: AC
  Filled 2015-08-15: qty 5

## 2015-08-15 MED ORDER — DIPHENHYDRAMINE HCL 50 MG/ML IJ SOLN: 50.0000 mg | Freq: Once | INTRAMUSCULAR | Status: DC | PRN

## 2015-08-15 MED ORDER — SODIUM CHLORIDE 0.9 % IV SOLN
Freq: Once | INTRAVENOUS | Status: AC
  Administered 2015-08-15: 15:00:00 via INTRAVENOUS
  Filled 2015-08-15: qty 1000

## 2015-08-15 MED ORDER — DENOSUMAB 120 MG/1.7ML ~~LOC~~ SOLN
120.0000 mg | Freq: Once | SUBCUTANEOUS | Status: AC
  Administered 2015-08-15: 120 mg via SUBCUTANEOUS
  Filled 2015-08-15: qty 1.7

## 2015-08-15 NOTE — Progress Notes (Signed)
Patient does not have living will.  Never smoked. 

## 2015-08-16 LAB — CANCER ANTIGEN 27.29: CA 27.29: 70.4 U/mL — ABNORMAL HIGH (ref 0.0–38.6)

## 2015-08-21 ENCOUNTER — Encounter: Payer: Self-pay | Admitting: Oncology

## 2015-08-21 NOTE — Progress Notes (Signed)
[place] @ [facility] Telephone:(336) [phone]  Fax:(336) [place]: 1946/10/28  MR#: [id number]  IHK#:[id number]  Patient Care Team: Guadalupe Maple, MD as PCP - General (Family Medicine) Robert Bellow, MD (General Surgery)  CHIEF COMPLAINT:  Chief Complaint  Patient presents with  . 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  Estrogen receptor 90%.  Progesterone receptor 60%.  Patient had lumpectomy and MammoSite partial breast radiation therapy followed by tamoxifen for 5 years.  Patient is still taking tamoxifen.. 2 recent mammogram in December of 2015 negative.  Further evaluation by surgeon revealed a palpable mass in the left upper outer quadrant near axilla.  Ultrasound revealed one small normal-appearing lymph node 0.6 cm in lower limit of axilla.  Adjacent to this there was ill-defined multilobulated hypoechoic mass 1.8 x 2.6 cm.  Biopsy of the left axillary mass was done which was positive for high-grade carcinoma.hormone receptors are pending  2,estrogen receptor positive.  Progesterone receptor positive.  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 3.  MRI scan of breast (January, 2015) reveals multiple abnormal enlarged left axillary lymph node There are 2 oval some circumscribed mass at 2:30 and 3:00 position of the left breast.  0.6 cm x 1.3 cm and 4 x 7 mm Non-mass enhancement extending 2.4 cm these masses is 3.6 cm mass.  Findings suggestive of several pulmonary nodules.  PET  scan shows extensive disease, 4.started on Taxotere,perjeta, Herceptin(January 03, 2015). 5.  Patient is on maintenance Herceptin that has been started on letrozole   And [facility] of 2016        Oncology Flowsheet 04/18/2015 04/21/2015 05/09/2015 06/06/2015 06/27/2015 07/18/2015 08/15/2015  Day, Cycle Day 1, Cycle 6 - - - - - -  denosumab (XGEVA) [place] - - - -  - 120 mg 120 mg  dexamethasone (DECADRON) IV [ 12 mg ] - - - - - -  diphenhydrAMINE (BENADRYL) PO - 50 mg 50 mg 50 mg - 50 mg 50 mg  DOCEtaxel (TAXOTERE) IV 75 mg/m2 - - - - - -  fosaprepitant (EMEND) IV [ 150 mg ] - - - - - -  palonosetron (ALOXI) IV 0.25 mg - - - - - -  pertuzumab (PERJETA) IV - 420 mg - - - - -  trastuzumab (HERCEPTIN) IV - 6 mg/kg 6 mg/kg 6 mg/kg 6 mg/kg 6 mg/kg 6 mg/kg    INTERVAL HISTORY:  69 year old lady with history of carcinoma of breast stage IV disease here for further follow-up regarding Herceptin therapy.  Patient has been started iBRANCE  and letrozole Appetite has been stable.  No chills.  No fever.  Patient had number of questions regarding changing and time hormone therapy. August, 2016 Patient is here for continuation of treatment with Herceptin.  Patient finally received IBRANCE and taking it along with letrozole and tolerating very well.  No cough or shortness of breath appetite has been stable. August 15, 2015 Patient is here for ongoing evaluation and treatment consideration and neck cycles of chemotherapy.  Patient is on maintenance Herceptin therapy on anti-estrogen therapy. No chills.  No fever.  No nausea.  No vomiting.  No diarrhea.  Tolerating IBRANCE well. REVIEW OF SYSTEMS:    general status: Patient is feeling weak and tired.  No change in a performance status.  No chills.  No fever. HEENT:   No  evidence of stomatitis Lungs: No cough or shortness of breath Cardiac: No chest pain or paroxysmal nocturnal dyspnea GI: No nausea no vomiting no diarrhea no abdominal pain Skin: No rash Lower extremity no swelling Neurological system: No tingling.  No numbness.  No other focal signs Musculoskeletal system no bony pains  As per HPI. Otherwise, a complete review of systems is negatve.  PAST MEDICAL HISTORY: Past Medical History  Diagnosis Date  . Hypertension 2009  . 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  Special screening for malignant neoplasms, colon   . Hyperlipidemia 1990  . Floaters 2014  . Cancer 2010    right breast ductal carcinoma in situ  . 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 ER 90%, PR 50%, MammoSite partial breast radiation  . Breast cancer 02/21/2009  . 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 Axillary mass 12/15/2014    PAST SURGICAL HISTORY: Past Surgical History  Procedure Laterality Date  . Appendectomy  1956  . Partial hysterectomy  1977  . Colonoscopy  2008    ??? Md  . Breast surgery Right 2010    wide excision  . Breast biopsy Right 2010    stereo  . Left axilla biopsy Left 12-14-14    FAMILY HISTORY Family History  Problem Relation Age of Onset  . Cancer Other     unknown family members with ovarian,colon,breast cancers    ADVANCED DIRECTIVES:  No flowsheet data found.  HEALTH MAINTENANCE: Social History  Substance Use Topics  . Smoking status: Never Smoker   . Smokeless tobacco: None  . Alcohol Use: No      Allergies  Allergen Reactions  . Penicillin G     Other reaction(s): Pruritic rash  . Sulfa Antibiotics Rash    Other reaction(s): Weal    Current Outpatient Prescriptions  Medication Sig Dispense Refill  . ALPRAZolam (XANAX) 0.5 MG tablet TAKE 1 TABLET BY MOUTH 3 TIMES A DAY AS NEEDED 90 tablet 0  . aspirin 81 MG tablet Take 81 mg by mouth daily.    Marland Kitchen aspirin 81 MG tablet Take by mouth.    . CRESTOR 5 MG tablet Take 5 mg by mouth daily at 6 PM.     . doxycycline (VIBRA-TABS) 100 MG tablet Take 1 tablet (100 mg total) by mouth 2 (two) times daily. 14 tablet 0  . fluconazole (DIFLUCAN) 100 MG tablet Take 1 tablet (100 mg total) by mouth daily. 5 tablet 0  . KLOR-CON M20 20 MEQ tablet TAKE 2 TABLETS BY MOUTH EVERY DAY 60 tablet 0  . letrozole (FEMARA) 2.5 MG tablet Take 1 tablet (2.5 mg  total) by mouth daily.    . magnesium oxide (MAG-OX) 400 (241.3 MG) MG tablet Take 1 tablet (400 mg total) by mouth daily. 30 tablet 3  . metFORMIN (GLUCOPHAGE) 500 MG tablet Take 500 mg by mouth 2 (two) times daily with a meal.     . metFORMIN (GLUCOPHAGE) 500 MG tablet Take by mouth.    . ondansetron (ZOFRAN) 4 MG tablet Take 4 mg by mouth every 8 (eight) hours as needed for nausea or vomiting.    . ONE TOUCH ULTRA TEST test strip     . palbociclib (IBRANCE) 125 MG capsule Take 1 capsule (125 mg total) by mouth daily with breakfast. Take whole with food  for 21 days then 7 days off.    . rosuvastatin (CRESTOR) 5 MG tablet Take by mouth.    . valsartan-hydrochlorothiazide (DIOVAN-HCT) 320-25 MG per tablet Take 1 tablet by mouth daily.     . valsartan-hydrochlorothiazide (DIOVAN-HCT) 320-25 MG per tablet Take by mouth.     No current facility-administered medications for this visit.   Facility-Administered Medications Ordered in Other Visits  Medication Dose Route Frequency Provider Last Rate Last Dose  . sodium chloride 0.9 % 250 mL with potassium chloride 40 mEq infusion   Intravenous Continuous Forest Gleason, MD   Stopped at 05/03/15 1229  . sodium chloride 0.9 % injection 10 mL  10 mL Intracatheter PRN Forest Gleason, MD   10 mL at 04/18/15 1545    OBJECTIVE:  Filed Vitals:   08/15/15 1404  BP: 152/83  Pulse: 90  Temp: 95.3 F (35.2 C)     Body mass index is 26.15 kg/(m^2).    ECOG FS:1 - Symptomatic but completely ambulatory  PHYSICAL EXAM: gENERAL  status: Performance status is good.  Patient has not lost significant weight HEENT: No evidence of stomatitis. Sclera and conjunctivae :: No jaundice.   pale looking. Lungs: Air  entry equal on both sides.  No rhonchi.  No rales.  Cardiac: Heart sounds are normal.  No pericardial rub.  No murmur. Lymphatic system: Cervical, axillary, inguinal, lymph nodes not palpable GI: Abdomen is soft.  No ascites.  Liver spleen not palpable.  No  tenderness.  Bowel sounds are within normal limit Lower extremity: No edema Neurological system: Higher functions, cranial nerves intact no evidence of peripheral neuropathy. Skin: No rash.  No ecchymosis.Clancy Gourd of right breast no evidence of palpable mass.  Right axillary area no palpable mass.    LEFT BREAST .  FREE  of masses LAB RESULTS:  Appointment on 08/15/2015  Component Date Value Ref Range Status  . WBC 08/15/2015 4.2  3.6 - 11.0 K/uL Final  . RBC 08/15/2015 4.09  3.80 - 5.20 MIL/uL Final  . Hemoglobin 08/15/2015 11.9* 12.0 - 16.0 g/dL Final  . HCT 08/15/2015 35.6  35.0 - 47.0 % Final  . MCV 08/15/2015 87.2  80.0 - 100.0 fL Final  . MCH 08/15/2015 29.0  26.0 - 34.0 pg Final  . MCHC 08/15/2015 33.3  32.0 - 36.0 g/dL Final  . RDW 08/15/2015 23.8* 11.5 - 14.5 % Final  . Platelets 08/15/2015 103* 150 - 440 K/uL Final  . Neutrophils Relative % 08/15/2015 58   Final  . Neutro Abs 08/15/2015 2.5  1.4 - 6.5 K/uL Final  . Lymphocytes Relative 08/15/2015 34   Final  . Lymphs Abs 08/15/2015 1.4  1.0 - 3.6 K/uL Final  . Monocytes Relative 08/15/2015 5   Final  . Monocytes Absolute 08/15/2015 0.2  0.2 - 0.9 K/uL Final  . Eosinophils Relative 08/15/2015 2   Final  . Eosinophils Absolute 08/15/2015 0.1  0 - 0.7 K/uL Final  . Basophils Relative 08/15/2015 1   Final  . Basophils Absolute 08/15/2015 0.0  0 - 0.1 K/uL Final  . Sodium 08/15/2015 134* 135 - 145 mmol/L Final  . Potassium 08/15/2015 3.5  3.5 - 5.1 mmol/L Final  . Chloride 08/15/2015 100* 101 - 111 mmol/L Final  . CO2 08/15/2015 28  22 - 32 mmol/L Final  . Glucose, Bld 08/15/2015 194* 65 - 99 mg/dL Final  . BUN 08/15/2015 13  6 - 20 mg/dL Final  . Creatinine, Ser 08/15/2015 0.84  0.44 -  1.00 mg/dL Final  . Calcium 08/15/2015 8.7* 8.9 - 10.3 mg/dL Final  . Total Protein 08/15/2015 7.7  6.5 - 8.1 g/dL Final  . Albumin 08/15/2015 4.4  3.5 - 5.0 g/dL Final  . AST 08/15/2015 28  15 - 41 U/L Final  . ALT 08/15/2015 27  14 - 54  U/L Final  . Alkaline Phosphatase 08/15/2015 66  38 - 126 U/L Final  . Total Bilirubin 08/15/2015 0.6  0.3 - 1.2 mg/dL Final  . GFR calc non Af Amer 08/15/2015 >60  >60 mL/min Final  . GFR calc Af Amer 08/15/2015 >60  >60 mL/min Final   Comment: (NOTE) The eGFR has been calculated using the CKD EPI equation. This calculation has not been validated in all clinical situations. eGFR's persistently <60 mL/min signify possible Chronic Kidney Disease.   . Anion gap 08/15/2015 6  5 - 15 Final  . CA 27.29 08/15/2015 70.4* 0.0 - 38.6 U/mL Final   Comment: (NOTE) Bayer Centaur/ACS methodology Performed At: [facility] [address] [place], [place] [id number] Lindon Romp MD TD:[id number]    FINDINGS: The left ventricular ejection fraction equals 63.3%. Previously 67%. Normal left ventricular wall motion.  IMPRESSION: Left ventricular ejection fraction equals 63.3%. Not significantly changed from previous exam.   Electronically Signed  By: Kerby Moors M.D.  On: 07/12/2015 12:02 Component     Latest Ref Rng 12/16/2014 04/25/2015 06/06/2015 07/18/2015 08/15/2015  CA 27.29     0.0 - 38.6 U/mL 35.9 66.2 (H) 47.4 (H) 51.7 (H) 70.4 (H)     ASSESSMENT: STAGE 4  carcinoma of breast good response to chemotherapy on maintenance Herceptin therapy Started on letrozole and  IBRANCE   MEDICAL DECISION MAKING:  Patient had number of questions regarding changing anti-hormonal therapy Continue Herceptin therapy MUGA scan of the heart has been stable Continue Herceptin Letrozole and IBRANCE  MUGA scan of the heart has been stable.  In July of 2016 Tumor markers are slightly going up if it continues to go up PET scan would be recommended  Total duration of visit was 30  minutes.  50% or more time was spent in counseling patient and family regarding prognosis and options of treatment and available resources  Patient expressed understanding and was in agreement with this plan.  She also understands that She can call clinic at any time with any questions, concerns, or complaints.    Breast cancer   Staging form: Breast, AJCC 7th Edition     Clinical stage from 04/06/2015: Stage IV (T2, N2, M1) - Signed by Evlyn Kanner, NP on 04/06/2015   Forest Gleason, MD   08/21/2015 8:50 AM

## 2015-09-05 ENCOUNTER — Inpatient Hospital Stay: Payer: Medicare Other | Attending: Oncology

## 2015-09-05 ENCOUNTER — Inpatient Hospital Stay (HOSPITAL_BASED_OUTPATIENT_CLINIC_OR_DEPARTMENT_OTHER): Payer: Medicare Other | Admitting: Oncology

## 2015-09-05 ENCOUNTER — Encounter: Payer: Self-pay | Admitting: Oncology

## 2015-09-05 ENCOUNTER — Inpatient Hospital Stay: Payer: Medicare Other

## 2015-09-05 VITALS — BP 161/84 | HR 106 | Temp 96.1°F | Wt 141.3 lb

## 2015-09-05 DIAGNOSIS — I10 Essential (primary) hypertension: Secondary | ICD-10-CM | POA: Diagnosis not present

## 2015-09-05 DIAGNOSIS — C50411 Malignant neoplasm of upper-outer quadrant of right female breast: Secondary | ICD-10-CM

## 2015-09-05 DIAGNOSIS — C50619 Malignant neoplasm of axillary tail of unspecified female breast: Secondary | ICD-10-CM

## 2015-09-05 DIAGNOSIS — R531 Weakness: Secondary | ICD-10-CM | POA: Diagnosis not present

## 2015-09-05 DIAGNOSIS — Z5111 Encounter for antineoplastic chemotherapy: Secondary | ICD-10-CM | POA: Diagnosis not present

## 2015-09-05 DIAGNOSIS — Z79899 Other long term (current) drug therapy: Secondary | ICD-10-CM

## 2015-09-05 DIAGNOSIS — E785 Hyperlipidemia, unspecified: Secondary | ICD-10-CM | POA: Diagnosis not present

## 2015-09-05 DIAGNOSIS — Z7982 Long term (current) use of aspirin: Secondary | ICD-10-CM | POA: Insufficient documentation

## 2015-09-05 DIAGNOSIS — Z79811 Long term (current) use of aromatase inhibitors: Secondary | ICD-10-CM | POA: Diagnosis not present

## 2015-09-05 DIAGNOSIS — Z170 Estrogen receptor positive status [ER+]: Secondary | ICD-10-CM | POA: Insufficient documentation

## 2015-09-05 DIAGNOSIS — R5383 Other fatigue: Secondary | ICD-10-CM | POA: Insufficient documentation

## 2015-09-05 DIAGNOSIS — C50919 Malignant neoplasm of unspecified site of unspecified female breast: Secondary | ICD-10-CM

## 2015-09-05 LAB — COMPREHENSIVE METABOLIC PANEL
ALT: 157 U/L — ABNORMAL HIGH (ref 14–54)
ANION GAP: 9 (ref 5–15)
AST: 97 U/L — AB (ref 15–41)
Albumin: 4.5 g/dL (ref 3.5–5.0)
Alkaline Phosphatase: 78 U/L (ref 38–126)
BILIRUBIN TOTAL: 1 mg/dL (ref 0.3–1.2)
BUN: 12 mg/dL (ref 6–20)
CHLORIDE: 95 mmol/L — AB (ref 101–111)
CO2: 27 mmol/L (ref 22–32)
Calcium: 8.9 mg/dL (ref 8.9–10.3)
Creatinine, Ser: 0.86 mg/dL (ref 0.44–1.00)
Glucose, Bld: 225 mg/dL — ABNORMAL HIGH (ref 65–99)
POTASSIUM: 3.6 mmol/L (ref 3.5–5.1)
Sodium: 131 mmol/L — ABNORMAL LOW (ref 135–145)
TOTAL PROTEIN: 8.3 g/dL — AB (ref 6.5–8.1)

## 2015-09-05 LAB — CBC WITH DIFFERENTIAL/PLATELET
BASOS ABS: 0.1 10*3/uL (ref 0–0.1)
Basophils Relative: 1 %
EOS PCT: 7 %
Eosinophils Absolute: 0.5 10*3/uL (ref 0–0.7)
HEMATOCRIT: 39.2 % (ref 35.0–47.0)
Hemoglobin: 13.2 g/dL (ref 12.0–16.0)
LYMPHS PCT: 23 %
Lymphs Abs: 1.7 10*3/uL (ref 1.0–3.6)
MCH: 30.5 pg (ref 26.0–34.0)
MCHC: 33.8 g/dL (ref 32.0–36.0)
MCV: 90.1 fL (ref 80.0–100.0)
MONO ABS: 0.7 10*3/uL (ref 0.2–0.9)
MONOS PCT: 9 %
Neutro Abs: 4.5 10*3/uL (ref 1.4–6.5)
Neutrophils Relative %: 60 %
PLATELETS: 246 10*3/uL (ref 150–440)
RBC: 4.35 MIL/uL (ref 3.80–5.20)
RDW: 24.5 % — AB (ref 11.5–14.5)
WBC: 7.4 10*3/uL (ref 3.6–11.0)

## 2015-09-05 LAB — MAGNESIUM: MAGNESIUM: 1.9 mg/dL (ref 1.7–2.4)

## 2015-09-05 MED ORDER — SODIUM CHLORIDE 0.9 % IV SOLN
Freq: Once | INTRAVENOUS | Status: AC
  Administered 2015-09-05: 14:00:00 via INTRAVENOUS
  Filled 2015-09-05: qty 1000

## 2015-09-05 MED ORDER — DIPHENHYDRAMINE HCL 25 MG PO CAPS
50.0000 mg | ORAL_CAPSULE | Freq: Once | ORAL | Status: AC
  Administered 2015-09-05: 50 mg via ORAL
  Filled 2015-09-05: qty 2

## 2015-09-05 MED ORDER — SODIUM CHLORIDE 0.9 % IJ SOLN
10.0000 mL | INTRAMUSCULAR | Status: DC | PRN
  Administered 2015-09-05: 10 mL
  Filled 2015-09-05: qty 10

## 2015-09-05 MED ORDER — TRASTUZUMAB CHEMO INJECTION 440 MG
6.0000 mg/kg | Freq: Once | INTRAVENOUS | Status: AC
  Administered 2015-09-05: 399 mg via INTRAVENOUS
  Filled 2015-09-05: qty 19

## 2015-09-05 MED ORDER — ACETAMINOPHEN 325 MG PO TABS
650.0000 mg | ORAL_TABLET | Freq: Once | ORAL | Status: AC
Start: 2015-09-05 — End: 2015-09-05
  Administered 2015-09-05: 650 mg via ORAL
  Filled 2015-09-05: qty 2

## 2015-09-05 MED ORDER — HEPARIN SOD (PORK) LOCK FLUSH 100 UNIT/ML IV SOLN
500.0000 [IU] | Freq: Once | INTRAVENOUS | Status: AC | PRN
  Administered 2015-09-05: 500 [IU]
  Filled 2015-09-05: qty 5

## 2015-09-05 NOTE — Progress Notes (Signed)
[place] @ [facility] [phone]  Fax:([phone] [place]: February 02, 1946  MR#: [id number]  JGG#:[id number]  Patient Care Team: Guadalupe Maple, MD as PCP - General (Family Medicine) Robert Bellow, MD (General Surgery)  CHIEF COMPLAINT:  Chief Complaint  Patient presents with  . OTHER    No complaints.   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  Estrogen receptor 90%.  Progesterone receptor 60%.  Patient had lumpectomy and MammoSite partial breast radiation therapy followed by tamoxifen for 5 years.  Patient is still taking tamoxifen.. 2 recent mammogram in December of 2015 negative.  Further evaluation by surgeon revealed a palpable mass in the left upper outer quadrant near axilla.  Ultrasound revealed one small normal-appearing lymph node 0.6 cm in lower limit of axilla.  Adjacent to this there was ill-defined multilobulated hypoechoic mass 1.8 x 2.6 cm.  Biopsy of the left axillary mass was done which was positive for high-grade carcinoma.hormone receptors are pending  2,estrogen receptor positive.  Progesterone receptor positive.  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 3.  MRI scan of breast (January, 2015) reveals multiple abnormal enlarged left axillary lymph node There are 2 oval some circumscribed mass at 2:30 and 3:00 position of the left breast.  0.6 cm x 1.3 cm and 4 x 7 mm Non-mass enhancement extending 2.4 cm these masses is 3.6 cm mass.  Findings suggestive of several pulmonary nodules.  PET  scan shows extensive disease, 4.started on Taxotere,perjeta, Herceptin(January 03, 2015). 5.  Patient is on maintenance Herceptin that has been started on letrozole   And [facility] of 2016        Oncology Flowsheet 04/18/2015 04/21/2015 05/09/2015 06/06/2015 06/27/2015 07/18/2015 08/15/2015  Day, Cycle Day 1, Cycle 6 - - - - - -  denosumab  (XGEVA) [place] - - - - - 120 mg 120 mg  dexamethasone (DECADRON) IV [ 12 mg ] - - - - - -  diphenhydrAMINE (BENADRYL) PO - 50 mg 50 mg 50 mg - 50 mg 50 mg  DOCEtaxel (TAXOTERE) IV 75 mg/m2 - - - - - -  fosaprepitant (EMEND) IV [ 150 mg ] - - - - - -  palonosetron (ALOXI) IV 0.25 mg - - - - - -  pertuzumab (PERJETA) IV - 420 mg - - - - -  trastuzumab (HERCEPTIN) IV - 6 mg/kg 6 mg/kg 6 mg/kg 6 mg/kg 6 mg/kg 6 mg/kg    INTERVAL HISTORY:  69 year old lady with history of carcinoma of breast stage IV disease here for further follow-up regarding Herceptin therapy.  Patient has been started iBRANCE  and letrozole Appetite has been stable.  No chills.  No fever.  Patient had number of questions regarding changing and time hormone therapy. August, 2016 Patient is here for continuation of treatment with Herceptin.  Patient finally received IBRANCE and taking it along with letrozole and tolerating very well.  No cough or shortness of breath appetite has been stable. August 15, 2015 Patient is here for ongoing evaluation and treatment consideration and neck cycles of chemotherapy.  Patient is on maintenance Herceptin therapy on anti-estrogen therapy. No chills.  No fever.  No nausea.  No vomiting.  No diarrhea.  Tolerating IBRANCE well.  September 05, 2015 Patient is here for ongoing evaluation and treatment consideration.  No bony pain.  Appetite has been stable.  No chills.  No fever. REVIEW OF SYSTEMS:    general status: Patient is feeling weak and tired.  No change in a performance status.  No chills.  No fever. HEENT:   No evidence of stomatitis Lungs: No cough or shortness of breath Cardiac: No chest pain or paroxysmal nocturnal dyspnea GI: No nausea no vomiting no diarrhea no abdominal pain Skin: No rash Lower extremity no swelling Neurological system: No tingling.  No numbness.  No other focal signs Musculoskeletal system no bony pains  As per HPI. Otherwise, a complete review of systems is  negatve.  PAST MEDICAL HISTORY: Past Medical History  Diagnosis Date  . Hypertension 2009  . 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 Special screening for malignant neoplasms, colon   . Hyperlipidemia 1990  . Floaters 2014  . Cancer 2010    right breast ductal carcinoma in situ  . 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 ER 90%, PR 50%, MammoSite partial breast radiation  . Breast cancer 02/21/2009  . 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 Axillary mass 12/15/2014    PAST SURGICAL HISTORY: Past Surgical History  Procedure Laterality Date  . Appendectomy  1956  . Partial hysterectomy  1977  . Colonoscopy  2008    ??? Md  . Breast surgery Right 2010    wide excision  . Breast biopsy Right 2010    stereo  . Left axilla biopsy Left 12-14-14    FAMILY HISTORY Family History  Problem Relation Age of Onset  . Cancer Other     unknown family members with ovarian,colon,breast cancers    ADVANCED DIRECTIVES:  Patient does have advance healthcare directive, Patient   does not desire to make any changes HEALTH MAINTENANCE: Social History  Substance Use Topics  . Smoking status: Never Smoker   . Smokeless tobacco: None  . Alcohol Use: No      Allergies  Allergen Reactions  . Penicillin G     Other reaction(s): Pruritic rash  . Sulfa Antibiotics Rash    Other reaction(s): Weal    Current Outpatient Prescriptions  Medication Sig Dispense Refill  . ALPRAZolam (XANAX) 0.5 MG tablet TAKE 1 TABLET BY MOUTH 3 TIMES A DAY AS NEEDED 90 tablet 0  . aspirin 81 MG tablet Take 81 mg by mouth daily.    Marland Kitchen aspirin 81 MG tablet Take by mouth.    . CRESTOR 5 MG tablet Take 5 mg by mouth daily at 6 PM.     . doxycycline (VIBRA-TABS) 100 MG tablet Take 1 tablet (100 mg total) by mouth 2 (two) times daily. 14 tablet 0    . fluconazole (DIFLUCAN) 100 MG tablet Take 1 tablet (100 mg total) by mouth daily. 5 tablet 0  . KLOR-CON M20 20 MEQ tablet TAKE 2 TABLETS BY MOUTH EVERY DAY 60 tablet 0  . letrozole (FEMARA) 2.5 MG tablet Take 1 tablet (2.5 mg total) by mouth daily.    . magnesium oxide (MAG-OX) 400 (241.3 MG) MG tablet Take 1 tablet (400 mg total) by mouth daily. 30 tablet 3  . metFORMIN (GLUCOPHAGE) 500 MG tablet Take 500 mg by mouth 2 (two) times daily with a meal.     . metFORMIN (GLUCOPHAGE) 500 MG tablet Take by mouth.    . ondansetron (ZOFRAN) 4 MG tablet Take 4 mg by mouth  every 8 (eight) hours as needed for nausea or vomiting.    . ONE TOUCH ULTRA TEST test strip     . palbociclib (IBRANCE) 125 MG capsule Take 1 capsule (125 mg total) by mouth daily with breakfast. Take whole with food for 21 days then 7 days off.    . rosuvastatin (CRESTOR) 5 MG tablet Take by mouth.    . valsartan-hydrochlorothiazide (DIOVAN-HCT) 320-25 MG per tablet Take 1 tablet by mouth daily.     . valsartan-hydrochlorothiazide (DIOVAN-HCT) 320-25 MG per tablet Take by mouth.     No current facility-administered medications for this visit.   Facility-Administered Medications Ordered in Other Visits  Medication Dose Route Frequency Provider Last Rate Last Dose  . sodium chloride 0.9 % 250 mL with potassium chloride 40 mEq infusion   Intravenous Continuous Forest Gleason, MD   Stopped at 05/03/15 1229  . sodium chloride 0.9 % injection 10 mL  10 mL Intracatheter PRN Forest Gleason, MD   10 mL at 04/18/15 1545    OBJECTIVE:  Filed Vitals:   09/05/15 1346  BP: 161/84  Pulse: 106  Temp: 96.1 F (35.6 C)     Body mass index is 26.01 kg/(m^2).    ECOG FS:1 - Symptomatic but completely ambulatory  PHYSICAL EXAM: gENERAL  status: Performance status is good.  Patient has not lost significant weight HEENT: No evidence of stomatitis. Sclera and conjunctivae :: No jaundice.   pale looking. Lungs: Air  entry equal on both sides.   No rhonchi.  No rales.  Cardiac: Heart sounds are normal.  No pericardial rub.  No murmur. Small subcutaneous nodule on the right side of the throat (chronic) Lymphatic system: Cervical, axillary, inguinal, lymph nodes not palpable GI: Abdomen is soft.  No ascites.  Liver spleen not palpable.  No tenderness.  Bowel sounds are within normal limit Lower extremity: No edema Neurological system: Higher functions, cranial nerves intact no evidence of peripheral neuropathy. Skin: No rash.  No ecchymosis.Clancy Gourd of right breast no evidence of palpable mass.  Right axillary area no palpable mass.    LEFT BREAST .  FREE  of masses LAB RESULTS:  Appointment on 09/05/2015  Component Date Value Ref Range Status  . WBC 09/05/2015 7.4  3.6 - 11.0 K/uL Final  . RBC 09/05/2015 4.35  3.80 - 5.20 MIL/uL Final  . Hemoglobin 09/05/2015 13.2  12.0 - 16.0 g/dL Final  . HCT 09/05/2015 39.2  35.0 - 47.0 % Final  . MCV 09/05/2015 90.1  80.0 - 100.0 fL Final  . MCH 09/05/2015 30.5  26.0 - 34.0 pg Final  . MCHC 09/05/2015 33.8  32.0 - 36.0 g/dL Final  . RDW 09/05/2015 24.5* 11.5 - 14.5 % Final  . Platelets 09/05/2015 246  150 - 440 K/uL Final  . Neutrophils Relative % 09/05/2015 60   Final  . Neutro Abs 09/05/2015 4.5  1.4 - 6.5 K/uL Final  . Lymphocytes Relative 09/05/2015 23   Final  . Lymphs Abs 09/05/2015 1.7  1.0 - 3.6 K/uL Final  . Monocytes Relative 09/05/2015 9   Final  . Monocytes Absolute 09/05/2015 0.7  0.2 - 0.9 K/uL Final  . Eosinophils Relative 09/05/2015 7   Final  . Eosinophils Absolute 09/05/2015 0.5  0 - 0.7 K/uL Final  . Basophils Relative 09/05/2015 1   Final  . Basophils Absolute 09/05/2015 0.1  0 - 0.1 K/uL Final  . Sodium 09/05/2015 131* 135 - 145 mmol/L Final  . Potassium 09/05/2015 3.6  3.5 - 5.1 mmol/L Final  . Chloride 09/05/2015 95* 101 - 111 mmol/L Final  . CO2 09/05/2015 27  22 - 32 mmol/L Final  . Glucose, Bld 09/05/2015 225* 65 - 99 mg/dL Final  . BUN 09/05/2015 12  6 - 20  mg/dL Final  . Creatinine, Ser 09/05/2015 0.86  0.44 - 1.00 mg/dL Final  . Calcium 09/05/2015 8.9  8.9 - 10.3 mg/dL Final  . Total Protein 09/05/2015 8.3* 6.5 - 8.1 g/dL Final  . Albumin 09/05/2015 4.5  3.5 - 5.0 g/dL Final  . AST 09/05/2015 97* 15 - 41 U/L Final  . ALT 09/05/2015 157* 14 - 54 U/L Final  . Alkaline Phosphatase 09/05/2015 78  38 - 126 U/L Final  . Total Bilirubin 09/05/2015 1.0  0.3 - 1.2 mg/dL Final  . GFR calc non Af Amer 09/05/2015 >60  >60 mL/min Final  . GFR calc Af Amer 09/05/2015 >60  >60 mL/min Final   Comment: (NOTE) The eGFR has been calculated using the CKD EPI equation. This calculation has not been validated in all clinical situations. eGFR's persistently <60 mL/min signify possible Chronic Kidney Disease.   . Anion gap 09/05/2015 9  5 - 15 Final  . Magnesium 09/05/2015 1.9  1.7 - 2.4 mg/dL Final   FINDINGS: The left ventricular ejection fraction equals 63.3%. Previously 67%. Normal left ventricular wall motion.  IMPRESSION: Left ventricular ejection fraction equals 63.3%. Not significantly changed from previous exam.   Electronically Signed  By: Kerby Moors M.D.  On: 07/12/2015 12:02 Component     Latest Ref Rng 12/16/2014 04/25/2015 06/06/2015 07/18/2015 08/15/2015  CA 27.29     0.0 - 38.6 U/mL 35.9 66.2 (H) 47.4 (H) 51.7 (H) 70.4 (H)     ASSESSMENT: STAGE 4  carcinoma of breast good response to chemotherapy on maintenance Herceptin therapy Started on letrozole and  IBRANCE   MEDICAL DECISION MAKING:  Patient had number of questions regarding changing anti-hormonal therapy Continue Herceptin therapy MUGA scan of the heart has been stable Continue Herceptin Letrozole and IBRANCE  MUGA scan of the heart has been stable.  In July of 2016 Tumor markers are slightly going up if it continues to go up PET scan would be recommended    Patient expressed understanding and was in agreement with this plan. She also understands that She can  call clinic at any time with any questions, concerns, or complaints.    Breast cancer   Staging form: Breast, AJCC 7th Edition     Clinical stage from 04/06/2015: Stage IV (T2, N2, M1) - Signed by Evlyn Kanner, NP on 04/06/2015   Forest Gleason, MD   09/05/2015 1:55 PM

## 2015-09-05 NOTE — Progress Notes (Signed)
Patient does not have living will.  Never smoked. 

## 2015-09-06 LAB — CANCER ANTIGEN 27.29: CA 27.29: 69.8 U/mL — AB (ref 0.0–38.6)

## 2015-09-12 ENCOUNTER — Telehealth: Payer: Self-pay | Admitting: *Deleted

## 2015-09-12 NOTE — Telephone Encounter (Signed)
Patient called to confirm appointment time.  States she has a card that has 09-26-15 with no time.  Also has a card with 09-29-15.  Called patient back to inform her that she does not have an appointment on 10-10 but does have one on 10-13. Verbalized understanding.

## 2015-09-15 ENCOUNTER — Other Ambulatory Visit: Payer: Self-pay | Admitting: Oncology

## 2015-09-16 ENCOUNTER — Telehealth: Payer: Self-pay | Admitting: *Deleted

## 2015-09-16 ENCOUNTER — Ambulatory Visit (INDEPENDENT_AMBULATORY_CARE_PROVIDER_SITE_OTHER): Payer: Medicare Other | Admitting: Unknown Physician Specialty

## 2015-09-16 ENCOUNTER — Encounter: Payer: Self-pay | Admitting: Unknown Physician Specialty

## 2015-09-16 VITALS — BP 134/64 | HR 92 | Temp 98.5°F | Ht 62.5 in | Wt 142.0 lb

## 2015-09-16 DIAGNOSIS — I129 Hypertensive chronic kidney disease with stage 1 through stage 4 chronic kidney disease, or unspecified chronic kidney disease: Secondary | ICD-10-CM

## 2015-09-16 DIAGNOSIS — N184 Chronic kidney disease, stage 4 (severe): Secondary | ICD-10-CM

## 2015-09-16 DIAGNOSIS — N181 Chronic kidney disease, stage 1: Secondary | ICD-10-CM | POA: Diagnosis not present

## 2015-09-16 DIAGNOSIS — I10 Essential (primary) hypertension: Secondary | ICD-10-CM | POA: Insufficient documentation

## 2015-09-16 DIAGNOSIS — E1122 Type 2 diabetes mellitus with diabetic chronic kidney disease: Secondary | ICD-10-CM

## 2015-09-16 DIAGNOSIS — E785 Hyperlipidemia, unspecified: Secondary | ICD-10-CM | POA: Diagnosis not present

## 2015-09-16 DIAGNOSIS — N183 Chronic kidney disease, stage 3 (moderate): Secondary | ICD-10-CM | POA: Diagnosis not present

## 2015-09-16 DIAGNOSIS — N189 Chronic kidney disease, unspecified: Secondary | ICD-10-CM | POA: Diagnosis not present

## 2015-09-16 DIAGNOSIS — N182 Chronic kidney disease, stage 2 (mild): Secondary | ICD-10-CM | POA: Diagnosis not present

## 2015-09-16 DIAGNOSIS — E119 Type 2 diabetes mellitus without complications: Secondary | ICD-10-CM | POA: Insufficient documentation

## 2015-09-16 DIAGNOSIS — N185 Chronic kidney disease, stage 5: Secondary | ICD-10-CM

## 2015-09-16 LAB — LIPID PANEL PICCOLO, WAIVED
CHOL/HDL RATIO PICCOLO,WAIVE: 2.9 mg/dL
Cholesterol Piccolo, Waived: 144 mg/dL (ref <200)
HDL Chol Piccolo, Waived: 50 mg/dL — ABNORMAL LOW (ref >59)
LDL CHOL CALC PICCOLO WAIVED: 52 mg/dL (ref <100)
TRIGLYCERIDES PICCOLO,WAIVED: 210 mg/dL — AB (ref <150)
VLDL Chol Calc Piccolo,Waive: 42 mg/dL — ABNORMAL HIGH (ref <30)

## 2015-09-16 LAB — BAYER DCA HB A1C WAIVED: HB A1C: 6.9 % (ref <7.0)

## 2015-09-16 NOTE — Telephone Encounter (Signed)
Pt stated has yellow toe nails and didn't know if needed to start taking doxycycline. Instructed pt that only needs to apply anti-fungal cream to nail beds at this time and to monitor. If symptoms worsen, pt was instructed to call back. Pt verbalized understanding.

## 2015-09-16 NOTE — Assessment & Plan Note (Signed)
AiC is 6.9 today. Well controlled on metformin 500mg  twice daily

## 2015-09-16 NOTE — Assessment & Plan Note (Signed)
Well controlled on crestor 5mg .

## 2015-09-16 NOTE — Progress Notes (Signed)
BP 134/64 mmHg  Pulse 92  Temp(Src) 98.5 F (36.9 C)  Ht 5' 2.5" (1.588 m)  Wt 142 lb (64.411 kg)  BMI 25.54 kg/m2  SpO2 97%   Subjective:    Patient ID: Tammy Pope, female    DOB: 05/10/1946, 69 y.o.   MRN: [id number]  HPI: Tammy Pope is a 69 y.o. female  Chief Complaint  Patient presents with  . Diabetes  . Hyperlipidemia  . Hypertension   She has completed chemotherapy treatment for breast cancer and currently taking oral chemo treatments. She is feeling well and very satisfied with her progress of treatments.  Diabetes:  She does not monitor her blood sugars at home. She is compliant with medication,  No missed doses. Denies hypoglycemic episodes, numbness or tingling.   Hypertension/Hyperlipidemia: She does not check blood pressure at home but does have it checked frequently at doctor visits. She is stable with manual check in office today. Denies headache, chest pain, shortness of breath or dizziness. She is compliant with medication regimen with no missed doses..  Relevant past medical, surgical, family and social history reviewed and updated as indicated. Interim medical history since our last visit reviewed. Allergies and medications reviewed and updated.  Review of Systems  Constitutional: Negative.  Negative for fever, chills, activity change and appetite change.  HENT: Negative.  Negative for congestion, postnasal drip, rhinorrhea, sinus pressure and sore throat.   Eyes: Negative.  Negative for discharge and redness.  Respiratory: Negative.  Negative for cough, chest tightness, shortness of breath, wheezing and stridor.   Cardiovascular: Negative.  Negative for chest pain, palpitations and leg swelling.  Gastrointestinal: Negative.  Negative for nausea, abdominal pain, diarrhea and constipation.  Genitourinary: Negative.  Negative for dysuria, urgency, frequency and difficulty urinating.  Musculoskeletal: Negative.  Negative for myalgias, back pain,  arthralgias and gait problem.  Skin: Negative.  Negative for color change, pallor and rash.  Neurological: Negative.  Negative for dizziness, weakness, light-headedness, numbness and headaches.  Psychiatric/Behavioral: Positive for behavioral problems. Negative for sleep disturbance, self-injury and decreased concentration. The patient is not nervous/anxious.    Diabetic Foot Exam - Simple   Simple Foot Form  Diabetic Foot exam was performed with the following findings:  Yes 09/16/2015  9:08 AM  Visual Inspection  No deformities, no ulcerations, no other skin breakdown bilaterally:  Yes  Sensation Testing  Intact to touch and monofilament testing bilaterally:  Yes  Pulse Check  Posterior Tibialis and Dorsalis pulse intact bilaterally:  Yes  Comments       Per HPI unless specifically indicated above     Objective:    BP 134/64 mmHg  Pulse 92  Temp(Src) 98.5 F (36.9 C)  Ht 5' 2.5" (1.588 m)  Wt 142 lb (64.411 kg)  BMI 25.54 kg/m2  SpO2 97%  Wt Readings from Last 3 Encounters:  09/16/15 142 lb (64.411 kg)  09/05/15 141 lb 5 oz (64.1 kg)  08/15/15 142 lb 2 oz (64.467 kg)    Physical Exam  Constitutional: She is oriented to person, place, and time. She appears well-developed and well-nourished.  HENT:  Head: Normocephalic and atraumatic.  Eyes: Conjunctivae are normal.  Neck: Normal range of motion.  Cardiovascular: Normal rate, regular rhythm and normal heart sounds.  Exam reveals no gallop and no friction rub.   No murmur heard. Pulmonary/Chest: Effort normal and breath sounds normal. No respiratory distress. She has no wheezes. She has no rales. She exhibits no tenderness.  Musculoskeletal:  Normal range of motion. She exhibits no edema or tenderness.  Neurological: She is alert and oriented to person, place, and time.  Skin: Skin is warm and dry. No rash noted. She is not diaphoretic. No erythema. No pallor.  Psychiatric: She has a normal mood and affect. Her behavior  is normal. Judgment and thought content normal.   Diabetic Foot Exam - Simple   Simple Foot Form  Diabetic Foot exam was performed with the following findings:  Yes 09/16/2015  9:08 AM  Visual Inspection  No deformities, no ulcerations, no other skin breakdown bilaterally:  Yes  Sensation Testing  Intact to touch and monofilament testing bilaterally:  Yes  Pulse Check  Posterior Tibialis and Dorsalis pulse intact bilaterally:  Yes  Comments         Assessment & Plan:   Problem List Items Addressed This Visit      Unprioritized   Hypertensive chronic kidney disease - Primary    Blood pressure is stable on valsartan-HCTZ 320-25mg       Relevant Orders   Comprehensive metabolic panel   Lipid Panel Piccolo, Waived   Type 2 diabetes mellitus with diabetic chronic kidney disease    AiC is 6.9 today. Well controlled on metformin 500mg  twice daily      Relevant Orders   Bayer DCA Hb A1c Waived   Lipid Panel Piccolo, Waived   Hyperlipidemia    Well controlled on crestor 5mg .         Follow up plan: Return in about 6 months (around 03/15/2016) for physical.

## 2015-09-16 NOTE — Assessment & Plan Note (Signed)
Blood pressure is stable on valsartan-HCTZ 320-25mg 

## 2015-09-17 LAB — COMPREHENSIVE METABOLIC PANEL
ALBUMIN: 4.6 g/dL (ref 3.5–4.8)
ALK PHOS: 81 IU/L (ref 39–117)
ALT: 90 IU/L — ABNORMAL HIGH (ref 0–32)
AST: 58 IU/L — ABNORMAL HIGH (ref 0–40)
Albumin/Globulin Ratio: 1.8 (ref 1.1–2.5)
BILIRUBIN TOTAL: 0.9 mg/dL (ref 0.0–1.2)
BUN / CREAT RATIO: 13 (ref 11–26)
BUN: 10 mg/dL (ref 8–27)
CHLORIDE: 93 mmol/L — AB (ref 97–108)
CO2: 24 mmol/L (ref 18–29)
Calcium: 9.5 mg/dL (ref 8.7–10.3)
Creatinine, Ser: 0.78 mg/dL (ref 0.57–1.00)
GFR calc Af Amer: 89 mL/min/{1.73_m2} (ref >59)
GFR calc non Af Amer: 77 mL/min/{1.73_m2} (ref >59)
GLUCOSE: 145 mg/dL — AB (ref 65–99)
Globulin, Total: 2.6 g/dL (ref 1.5–4.5)
Potassium: 4 mmol/L (ref 3.5–5.2)
SODIUM: 136 mmol/L (ref 134–144)
Total Protein: 7.2 g/dL (ref 6.0–8.5)

## 2015-09-26 ENCOUNTER — Ambulatory Visit: Payer: Medicare Other | Admitting: Oncology

## 2015-09-26 ENCOUNTER — Ambulatory Visit: Payer: Medicare Other

## 2015-09-26 ENCOUNTER — Other Ambulatory Visit: Payer: Medicare Other

## 2015-09-29 ENCOUNTER — Inpatient Hospital Stay (HOSPITAL_BASED_OUTPATIENT_CLINIC_OR_DEPARTMENT_OTHER): Payer: Medicare Other | Admitting: Oncology

## 2015-09-29 ENCOUNTER — Inpatient Hospital Stay: Payer: Medicare Other

## 2015-09-29 ENCOUNTER — Inpatient Hospital Stay: Payer: Medicare Other | Attending: Oncology

## 2015-09-29 ENCOUNTER — Encounter: Payer: Self-pay | Admitting: Oncology

## 2015-09-29 VITALS — BP 161/87 | HR 86 | Temp 97.2°F | Resp 19 | Ht 61.0 in | Wt 139.3 lb

## 2015-09-29 DIAGNOSIS — Z79811 Long term (current) use of aromatase inhibitors: Secondary | ICD-10-CM | POA: Diagnosis not present

## 2015-09-29 DIAGNOSIS — Z170 Estrogen receptor positive status [ER+]: Secondary | ICD-10-CM

## 2015-09-29 DIAGNOSIS — Z853 Personal history of malignant neoplasm of breast: Secondary | ICD-10-CM | POA: Insufficient documentation

## 2015-09-29 DIAGNOSIS — E785 Hyperlipidemia, unspecified: Secondary | ICD-10-CM | POA: Insufficient documentation

## 2015-09-29 DIAGNOSIS — Z5111 Encounter for antineoplastic chemotherapy: Secondary | ICD-10-CM | POA: Insufficient documentation

## 2015-09-29 DIAGNOSIS — I10 Essential (primary) hypertension: Secondary | ICD-10-CM | POA: Insufficient documentation

## 2015-09-29 DIAGNOSIS — C50612 Malignant neoplasm of axillary tail of left female breast: Secondary | ICD-10-CM | POA: Diagnosis not present

## 2015-09-29 DIAGNOSIS — Z809 Family history of malignant neoplasm, unspecified: Secondary | ICD-10-CM

## 2015-09-29 DIAGNOSIS — R918 Other nonspecific abnormal finding of lung field: Secondary | ICD-10-CM

## 2015-09-29 DIAGNOSIS — C50619 Malignant neoplasm of axillary tail of unspecified female breast: Secondary | ICD-10-CM

## 2015-09-29 DIAGNOSIS — R531 Weakness: Secondary | ICD-10-CM | POA: Insufficient documentation

## 2015-09-29 DIAGNOSIS — Z79899 Other long term (current) drug therapy: Secondary | ICD-10-CM

## 2015-09-29 DIAGNOSIS — R5383 Other fatigue: Secondary | ICD-10-CM | POA: Insufficient documentation

## 2015-09-29 DIAGNOSIS — Z7982 Long term (current) use of aspirin: Secondary | ICD-10-CM | POA: Diagnosis not present

## 2015-09-29 DIAGNOSIS — C50919 Malignant neoplasm of unspecified site of unspecified female breast: Secondary | ICD-10-CM

## 2015-09-29 LAB — CBC WITH DIFFERENTIAL/PLATELET
BASOS ABS: 0.1 10*3/uL (ref 0–0.1)
BASOS PCT: 1 %
EOS ABS: 0.2 10*3/uL (ref 0–0.7)
EOS PCT: 3 %
HCT: 37.7 % (ref 35.0–47.0)
Hemoglobin: 12.8 g/dL (ref 12.0–16.0)
LYMPHS PCT: 25 %
Lymphs Abs: 1.6 10*3/uL (ref 1.0–3.6)
MCH: 32 pg (ref 26.0–34.0)
MCHC: 34 g/dL (ref 32.0–36.0)
MCV: 94 fL (ref 80.0–100.0)
MONO ABS: 0.3 10*3/uL (ref 0.2–0.9)
Monocytes Relative: 4 %
Neutro Abs: 4.3 10*3/uL (ref 1.4–6.5)
Neutrophils Relative %: 67 %
PLATELETS: 187 10*3/uL (ref 150–440)
RBC: 4.01 MIL/uL (ref 3.80–5.20)
RDW: 20.3 % — AB (ref 11.5–14.5)
WBC: 6.4 10*3/uL (ref 3.6–11.0)

## 2015-09-29 LAB — COMPREHENSIVE METABOLIC PANEL
ALBUMIN: 4.4 g/dL (ref 3.5–5.0)
ALK PHOS: 57 U/L (ref 38–126)
ALT: 45 U/L (ref 14–54)
ANION GAP: 8 (ref 5–15)
AST: 43 U/L — ABNORMAL HIGH (ref 15–41)
BUN: 12 mg/dL (ref 6–20)
CHLORIDE: 95 mmol/L — AB (ref 101–111)
CO2: 26 mmol/L (ref 22–32)
Calcium: 8.7 mg/dL — ABNORMAL LOW (ref 8.9–10.3)
Creatinine, Ser: 0.75 mg/dL (ref 0.44–1.00)
GFR calc non Af Amer: >60 mL/min (ref >60)
GLUCOSE: 204 mg/dL — AB (ref 65–99)
POTASSIUM: 3.8 mmol/L (ref 3.5–5.1)
SODIUM: 129 mmol/L — AB (ref 135–145)
Total Bilirubin: 0.9 mg/dL (ref 0.3–1.2)
Total Protein: 7.6 g/dL (ref 6.5–8.1)

## 2015-09-29 MED ORDER — ACETAMINOPHEN 325 MG PO TABS
650.0000 mg | ORAL_TABLET | Freq: Once | ORAL | Status: AC
  Administered 2015-09-29: 650 mg via ORAL
  Filled 2015-09-29: qty 2

## 2015-09-29 MED ORDER — SODIUM CHLORIDE 0.9 % IV SOLN
Freq: Once | INTRAVENOUS | Status: AC
  Administered 2015-09-29: 15:00:00 via INTRAVENOUS
  Filled 2015-09-29: qty 1000

## 2015-09-29 MED ORDER — HEPARIN SOD (PORK) LOCK FLUSH 100 UNIT/ML IV SOLN
500.0000 [IU] | Freq: Once | INTRAVENOUS | Status: AC | PRN
  Administered 2015-09-29: 500 [IU]
  Filled 2015-09-29: qty 5

## 2015-09-29 MED ORDER — DIPHENHYDRAMINE HCL 25 MG PO CAPS
50.0000 mg | ORAL_CAPSULE | Freq: Once | ORAL | Status: AC
  Administered 2015-09-29: 50 mg via ORAL
  Filled 2015-09-29: qty 2

## 2015-09-29 MED ORDER — INFLUENZA VAC SPLIT QUAD 0.5 ML IM SUSY
0.5000 mL | PREFILLED_SYRINGE | Freq: Once | INTRAMUSCULAR | Status: AC
  Administered 2015-09-29: 0.5 mL via INTRAMUSCULAR
  Filled 2015-09-29: qty 0.5

## 2015-09-29 MED ORDER — TRASTUZUMAB CHEMO INJECTION 440 MG
6.0000 mg/kg | Freq: Once | INTRAVENOUS | Status: AC
  Administered 2015-09-29: 399 mg via INTRAVENOUS
  Filled 2015-09-29: qty 19

## 2015-09-29 MED ORDER — SODIUM CHLORIDE 0.9 % IJ SOLN
10.0000 mL | INTRAMUSCULAR | Status: DC | PRN
  Filled 2015-09-29: qty 10

## 2015-09-29 MED ORDER — DENOSUMAB 120 MG/1.7ML ~~LOC~~ SOLN
120.0000 mg | Freq: Once | SUBCUTANEOUS | Status: AC
  Administered 2015-09-29: 120 mg via SUBCUTANEOUS
  Filled 2015-09-29: qty 1.7

## 2015-09-30 ENCOUNTER — Encounter: Payer: Self-pay | Admitting: Oncology

## 2015-09-30 LAB — CANCER ANTIGEN 27.29: CA 27.29: 68.8 U/mL — ABNORMAL HIGH (ref 0.0–38.6)

## 2015-09-30 NOTE — Progress Notes (Signed)
[place] @ [facility] [phone]  Fax:(336) [place]: [birth year]  MR#: [id number]  ONG#:[id number]  Patient Care Team: Kathrine Haddock, NP as PCP - General (Nurse Practitioner) Robert Bellow, MD (General Surgery)  CHIEF COMPLAINT:  Chief Complaint  Patient presents with  . 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  Estrogen receptor 90%.  Progesterone receptor 60%.  Patient had lumpectomy and MammoSite partial breast radiation therapy followed by tamoxifen for 5 years.  Patient is still taking tamoxifen.. 2 recent mammogram in December of 2015 negative.  Further evaluation by surgeon revealed a palpable mass in the left upper outer quadrant near axilla.  Ultrasound revealed one small normal-appearing lymph node 0.6 cm in lower limit of axilla.  Adjacent to this there was ill-defined multilobulated hypoechoic mass 1.8 x 2.6 cm.  Biopsy of the left axillary mass was done which was positive for high-grade carcinoma.hormone receptors are pending  2,estrogen receptor positive.  Progesterone receptor positive.  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 3.  MRI scan of breast (January, 2015) reveals multiple abnormal enlarged left axillary lymph node There are 2 oval some circumscribed mass at 2:30 and 3:00 position of the left breast.  0.6 cm x 1.3 cm and 4 x 7 mm Non-mass enhancement extending 2.4 cm these masses is 3.6 cm mass.  Findings suggestive of several pulmonary nodules.  PET  scan shows extensive disease, 4.started on Taxotere,perjeta, Herceptin(January 03, 2015). 5.  Patient is on maintenance Herceptin that has been started on letrozole   And [facility] of 2016        Oncology Flowsheet 05/09/2015 06/06/2015 06/27/2015 07/18/2015 08/15/2015 09/05/2015 09/29/2015  Day, Cycle - - - - - - -  denosumab (XGEVA) [place] - - - 120 mg 120  mg - 120 mg  dexamethasone (DECADRON) IV - - - - - - -  diphenhydrAMINE (BENADRYL) PO 50 mg 50 mg - 50 mg 50 mg 50 mg 50 mg  DOCEtaxel (TAXOTERE) IV - - - - - - -  fosaprepitant (EMEND) IV - - - - - - -  palonosetron (ALOXI) IV - - - - - - -  pertuzumab (PERJETA) IV - - - - - - -  trastuzumab (HERCEPTIN) IV 6 mg/kg 6 mg/kg 6 mg/kg 6 mg/kg 6 mg/kg 6 mg/kg 6 mg/kg    INTERVAL HISTORY:  69 year old lady with history of carcinoma of breast stage IV disease here for further follow-up regarding Herceptin therapy.  Patient has been started [facility]  and letrozole 69 year old lady with stage IV carcinoma of breast presently getting IBRANCE and letrozole The patient is here for ongoing evaluation and treatment consideration.  No bony pains appetite remains stable.  No nausea or vomiting.  Patient also desires flu shot. Tumor markers are also being evaluated On Herceptin therapy   REVIEW OF SYSTEMS:    general status: Patient is feeling weak and tired.  No change in a performance status.  No chills.  No fever. HEENT:   No evidence of stomatitis Lungs: No cough or shortness of breath Cardiac: No chest pain or paroxysmal nocturnal dyspnea GI: No nausea no vomiting no diarrhea no abdominal pain Skin: No rash Lower extremity no swelling Neurological system: No tingling.  No numbness.  No other focal signs Musculoskeletal system no bony pains  As per HPI. Otherwise, a complete review of systems  is negatve.  PAST MEDICAL HISTORY: Past Medical History  Diagnosis Date  . Hypertension 2009  . 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 Special screening for malignant neoplasms, colon   . Hyperlipidemia 1990  . Floaters 2014  . Cancer [facility]) 2010    right breast ductal carcinoma in situ  . Malignant neoplasm of upper-outer quadrant of female breast [facility]) February 21, 2009    DCIS of the right breast, intermediate grade, resected the negative margins. ER 90%, PR 50%,  MammoSite partial breast radiation  . Breast cancer ([place]) 02/21/2009  . Malignant neoplasm of axillary tail of female breast ([place]) 12/16/2014  . Axillary mass 12/15/2014    PAST SURGICAL HISTORY: Past Surgical History  Procedure Laterality Date  . Appendectomy  1956  . Partial hysterectomy  1977  . Colonoscopy  2008    ??? Md  . Breast surgery Right 2010    wide excision  . Breast biopsy Right 2010    stereo  . Left axilla biopsy Left 12-14-14    FAMILY HISTORY Family History  Problem Relation Age of Onset  . Cancer Other     unknown family members with ovarian,colon,breast cancers  . Hypertension Mother   . Heart disease Daughter     MI    ADVANCED DIRECTIVES:  Patient does have advance healthcare directive, Patient   does not desire to make any changes HEALTH MAINTENANCE: Social History  Substance Use Topics  . Smoking status: Never Smoker   . Smokeless tobacco: Never Used  . Alcohol Use: No      Allergies  Allergen Reactions  . Penicillin G     Other reaction(s): Pruritic rash  . Sulfa Antibiotics Rash    Other reaction(s): Weal    Current Outpatient Prescriptions  Medication Sig Dispense Refill  . ALPRAZolam (XANAX) 0.5 MG tablet TAKE 1 TABLET BY MOUTH 3 TIMES A DAY AS NEEDED 90 tablet 0  . aspirin 81 MG tablet Take 81 mg by mouth daily.    . CRESTOR 5 MG tablet Take 5 mg by mouth daily at 6 PM.     . KLOR-CON M20 20 MEQ tablet TAKE 2 TABLETS BY MOUTH EVERY DAY 60 tablet 0  . letrozole (FEMARA) 2.5 MG tablet Take 1 tablet (2.5 mg total) by mouth daily.    . magnesium oxide (MAG-OX) 400 (241.3 MG) MG tablet Take 1 tablet (400 mg total) by mouth daily. 30 tablet 3  . metFORMIN (GLUCOPHAGE) 500 MG tablet Take 500 mg by mouth 2 (two) times daily with a meal.     . ondansetron (ZOFRAN) 4 MG tablet Take 4 mg by mouth every 8 (eight) hours as needed for nausea or vomiting.    . ONE TOUCH ULTRA TEST test strip     . palbociclib (IBRANCE) 125 MG capsule Take 1  capsule (125 mg total) by mouth daily with breakfast. Take whole with food for 21 days then 7 days off.    . valsartan-hydrochlorothiazide (DIOVAN-HCT) 320-25 MG per tablet Take 1 tablet by mouth daily.      No current facility-administered medications for this visit.   Facility-Administered Medications Ordered in Other Visits  Medication Dose Route Frequency Provider Last Rate Last Dose  . sodium chloride 0.9 % 250 mL with potassium chloride 40 mEq infusion   Intravenous Continuous Forest Gleason, MD   Stopped at 05/03/15 1229  . sodium chloride 0.9 % injection 10 mL  10 mL Intracatheter PRN Delorise Shiner  Maisyn Nouri, MD   10 mL at 04/18/15 1545    OBJECTIVE:  Filed Vitals:   09/29/15 1400  BP: 161/87  Pulse: 86  Temp: 97.2 F (36.2 C)  Resp: 19     Body mass index is 26.34 kg/(m^2).    ECOG FS:1 - Symptomatic but completely ambulatory  PHYSICAL EXAM: gENERAL  status: Performance status is good.  Patient has not lost significant weight HEENT: No evidence of stomatitis. Sclera and conjunctivae :: No jaundice.   pale looking. Lungs: Air  entry equal on both sides.  No rhonchi.  No rales.  Cardiac: Heart sounds are normal.  No pericardial rub.  No murmur. Small subcutaneous nodule on the right side of the throat (chronic) Lymphatic system: Cervical, axillary, inguinal, lymph nodes not palpable GI: Abdomen is soft.  No ascites.  Liver spleen not palpable.  No tenderness.  Bowel sounds are within normal limit Lower extremity: No edema Neurological system: Higher functions, cranial nerves intact no evidence of peripheral neuropathy. Skin: No rash.  No ecchymosis.. Right breast small palpable mass in the outer lower quadrant unchanged.  Right axillary area no palpable mass.    LEFT BREAST .  FREE  of masses LAB RESULTS:  Appointment on 09/29/2015  Component Date Value Ref Range Status  . WBC 09/29/2015 6.4  3.6 - 11.0 K/uL Final  . RBC 09/29/2015 4.01  3.80 - 5.20 MIL/uL Final  . Hemoglobin  09/29/2015 12.8  12.0 - 16.0 g/dL Final  . HCT 09/29/2015 37.7  35.0 - 47.0 % Final  . MCV 09/29/2015 94.0  80.0 - 100.0 fL Final  . MCH 09/29/2015 32.0  26.0 - 34.0 pg Final  . MCHC 09/29/2015 34.0  32.0 - 36.0 g/dL Final  . RDW 09/29/2015 20.3* 11.5 - 14.5 % Final  . Platelets 09/29/2015 187  150 - 440 K/uL Final  . Neutrophils Relative % 09/29/2015 67   Final  . Neutro Abs 09/29/2015 4.3  1.4 - 6.5 K/uL Final  . Lymphocytes Relative 09/29/2015 25   Final  . Lymphs Abs 09/29/2015 1.6  1.0 - 3.6 K/uL Final  . Monocytes Relative 09/29/2015 4   Final  . Monocytes Absolute 09/29/2015 0.3  0.2 - 0.9 K/uL Final  . Eosinophils Relative 09/29/2015 3   Final  . Eosinophils Absolute 09/29/2015 0.2  0 - 0.7 K/uL Final  . Basophils Relative 09/29/2015 1   Final  . Basophils Absolute 09/29/2015 0.1  0 - 0.1 K/uL Final  . Sodium 09/29/2015 129* 135 - 145 mmol/L Final  . Potassium 09/29/2015 3.8  3.5 - 5.1 mmol/L Final  . Chloride 09/29/2015 95* 101 - 111 mmol/L Final  . CO2 09/29/2015 26  22 - 32 mmol/L Final  . Glucose, Bld 09/29/2015 204* 65 - 99 mg/dL Final  . BUN 09/29/2015 12  6 - 20 mg/dL Final  . Creatinine, Ser 09/29/2015 0.75  0.44 - 1.00 mg/dL Final  . Calcium 09/29/2015 8.7* 8.9 - 10.3 mg/dL Final  . Total Protein 09/29/2015 7.6  6.5 - 8.1 g/dL Final  . Albumin 09/29/2015 4.4  3.5 - 5.0 g/dL Final  . AST 09/29/2015 43* 15 - 41 U/L Final  . ALT 09/29/2015 45  14 - 54 U/L Final  . Alkaline Phosphatase 09/29/2015 57  38 - 126 U/L Final  . Total Bilirubin 09/29/2015 0.9  0.3 - 1.2 mg/dL Final  . GFR calc non Af Amer 09/29/2015 >60  >60 mL/min Final  . GFR calc Af Amer 09/29/2015 >60  >  60 mL/min Final   Comment: (NOTE) The eGFR has been calculated using the CKD EPI equation. This calculation has not been validated in all clinical situations. eGFR's persistently <60 mL/min signify possible Chronic Kidney Disease.   . Anion gap 09/29/2015 8  5 - 15 Final  . CA 27.29 09/29/2015 68.8*  0.0 - 38.6 U/mL Final   Comment: (NOTE) [employer]/ACS methodology Performed At: [facility] [address] [place], [place] [id number] Lindon Romp MD DG:[id number]    FINDINGS: The left ventricular ejection fraction equals 63.3%. Previously 67%. Normal left ventricular wall motion.  IMPRESSION: Left ventricular ejection fraction equals 63.3%. Not significantly changed from previous exam. Last MUGA scan was in July of 2016  Electronically Signed  By: Kerby Moors M.D.  On: 07/12/2015 12:02 Component     Latest Ref Rng 12/16/2014 04/25/2015 06/06/2015 07/18/2015 08/15/2015  CA 27.29     0.0 - 38.6 U/mL 35.9 66.2 (H) 47.4 (H) 51.7 (H) 70.4 (H)     ASSESSMENT: STAGE 4  carcinoma of breast good response to chemotherapy on maintenance Herceptin therapy Started on letrozole and  [facility]   MEDICAL DECISION MAKING:  Patient had number of questions regarding changing anti-hormonal therapy Continue Herceptin therapy MUGA scan of the heart has been stable (last MUGA scan of the heart was in July of 2016) Continue Herceptin Letrozole and IBRANCE  MUGA scan of the heart has been stable.  In July of 2016 Tumor marker has stabilized.  We will continue IBRANCE letrozole which patient is tolerating very well.  Continue XGEVA In T Maintenance Herceptin On clinical ground and by lab data and by PET scan criteria patient does not have any progressive disease  Patient expressed understanding and was in agreement with this plan. She also understands that She can call clinic at any time with any questions, concerns, or complaints.    Breast cancer   Staging form: Breast, AJCC 7th Edition     Clinical stage from 04/06/2015: Stage IV (T2, N2, M1) - Signed by Evlyn Kanner, NP on 04/06/2015   Forest Gleason, MD   09/30/2015 8:17 AM

## 2015-10-01 ENCOUNTER — Other Ambulatory Visit: Payer: Self-pay | Admitting: Oncology

## 2015-10-01 ENCOUNTER — Other Ambulatory Visit: Payer: Self-pay | Admitting: Hematology and Oncology

## 2015-10-01 NOTE — Telephone Encounter (Signed)
  Sent to me accidentally.  M

## 2015-10-20 ENCOUNTER — Inpatient Hospital Stay (HOSPITAL_BASED_OUTPATIENT_CLINIC_OR_DEPARTMENT_OTHER): Payer: Medicare Other | Admitting: Oncology

## 2015-10-20 ENCOUNTER — Inpatient Hospital Stay: Payer: Medicare Other

## 2015-10-20 ENCOUNTER — Inpatient Hospital Stay: Payer: Medicare Other | Attending: Oncology

## 2015-10-20 VITALS — BP 147/79 | HR 92 | Temp 96.3°F | Wt 137.8 lb

## 2015-10-20 DIAGNOSIS — E785 Hyperlipidemia, unspecified: Secondary | ICD-10-CM | POA: Diagnosis not present

## 2015-10-20 DIAGNOSIS — Z79811 Long term (current) use of aromatase inhibitors: Secondary | ICD-10-CM | POA: Insufficient documentation

## 2015-10-20 DIAGNOSIS — Z7984 Long term (current) use of oral hypoglycemic drugs: Secondary | ICD-10-CM | POA: Diagnosis not present

## 2015-10-20 DIAGNOSIS — R531 Weakness: Secondary | ICD-10-CM

## 2015-10-20 DIAGNOSIS — Z170 Estrogen receptor positive status [ER+]: Secondary | ICD-10-CM | POA: Diagnosis not present

## 2015-10-20 DIAGNOSIS — Z923 Personal history of irradiation: Secondary | ICD-10-CM | POA: Insufficient documentation

## 2015-10-20 DIAGNOSIS — Z7982 Long term (current) use of aspirin: Secondary | ICD-10-CM | POA: Diagnosis not present

## 2015-10-20 DIAGNOSIS — Z79899 Other long term (current) drug therapy: Secondary | ICD-10-CM

## 2015-10-20 DIAGNOSIS — C50619 Malignant neoplasm of axillary tail of unspecified female breast: Secondary | ICD-10-CM

## 2015-10-20 DIAGNOSIS — R5383 Other fatigue: Secondary | ICD-10-CM | POA: Diagnosis not present

## 2015-10-20 DIAGNOSIS — C50411 Malignant neoplasm of upper-outer quadrant of right female breast: Secondary | ICD-10-CM | POA: Diagnosis not present

## 2015-10-20 DIAGNOSIS — C7951 Secondary malignant neoplasm of bone: Secondary | ICD-10-CM | POA: Diagnosis not present

## 2015-10-20 DIAGNOSIS — R918 Other nonspecific abnormal finding of lung field: Secondary | ICD-10-CM | POA: Insufficient documentation

## 2015-10-20 DIAGNOSIS — Z5112 Encounter for antineoplastic immunotherapy: Secondary | ICD-10-CM | POA: Diagnosis not present

## 2015-10-20 DIAGNOSIS — C50919 Malignant neoplasm of unspecified site of unspecified female breast: Secondary | ICD-10-CM

## 2015-10-20 DIAGNOSIS — Z794 Long term (current) use of insulin: Secondary | ICD-10-CM

## 2015-10-20 DIAGNOSIS — I10 Essential (primary) hypertension: Secondary | ICD-10-CM | POA: Insufficient documentation

## 2015-10-20 LAB — COMPREHENSIVE METABOLIC PANEL
ALT: 27 U/L (ref 14–54)
ANION GAP: 5 (ref 5–15)
AST: 33 U/L (ref 15–41)
Albumin: 4.2 g/dL (ref 3.5–5.0)
Alkaline Phosphatase: 54 U/L (ref 38–126)
BUN: 11 mg/dL (ref 6–20)
CHLORIDE: 100 mmol/L — AB (ref 101–111)
CO2: 29 mmol/L (ref 22–32)
CREATININE: 0.65 mg/dL (ref 0.44–1.00)
Calcium: 8.7 mg/dL — ABNORMAL LOW (ref 8.9–10.3)
GFR calc non Af Amer: >60 mL/min (ref >60)
Glucose, Bld: 125 mg/dL — ABNORMAL HIGH (ref 65–99)
POTASSIUM: 3.9 mmol/L (ref 3.5–5.1)
SODIUM: 134 mmol/L — AB (ref 135–145)
Total Bilirubin: 0.6 mg/dL (ref 0.3–1.2)
Total Protein: 7.8 g/dL (ref 6.5–8.1)

## 2015-10-20 LAB — CBC WITH DIFFERENTIAL/PLATELET
Basophils Absolute: 0 10*3/uL (ref 0–0.1)
Basophils Relative: 1 %
EOS ABS: 0.2 10*3/uL (ref 0–0.7)
EOS PCT: 3 %
HCT: 37.2 % (ref 35.0–47.0)
Hemoglobin: 12.5 g/dL (ref 12.0–16.0)
LYMPHS ABS: 1.7 10*3/uL (ref 1.0–3.6)
Lymphocytes Relative: 29 %
MCH: 32.6 pg (ref 26.0–34.0)
MCHC: 33.7 g/dL (ref 32.0–36.0)
MCV: 96.9 fL (ref 80.0–100.0)
Monocytes Absolute: 0.4 10*3/uL (ref 0.2–0.9)
Monocytes Relative: 7 %
Neutro Abs: 3.6 10*3/uL (ref 1.4–6.5)
Neutrophils Relative %: 60 %
PLATELETS: 168 10*3/uL (ref 150–440)
RBC: 3.84 MIL/uL (ref 3.80–5.20)
RDW: 15.7 % — ABNORMAL HIGH (ref 11.5–14.5)
WBC: 5.9 10*3/uL (ref 3.6–11.0)

## 2015-10-20 MED ORDER — SODIUM CHLORIDE 0.9 % IJ SOLN
10.0000 mL | INTRAMUSCULAR | Status: AC | PRN
  Administered 2015-10-20: 10 mL
  Filled 2015-10-20: qty 10

## 2015-10-20 MED ORDER — HEPARIN SOD (PORK) LOCK FLUSH 100 UNIT/ML IV SOLN
INTRAVENOUS | Status: AC
  Filled 2015-10-20: qty 5

## 2015-10-20 MED ORDER — HEPARIN SOD (PORK) LOCK FLUSH 100 UNIT/ML IV SOLN
500.0000 [IU] | INTRAVENOUS | Status: AC | PRN
  Administered 2015-10-20: 500 [IU]

## 2015-10-20 MED ORDER — HEPARIN SOD (PORK) LOCK FLUSH 100 UNIT/ML IV SOLN
250.0000 [IU] | INTRAVENOUS | Status: AC | PRN
  Filled 2015-10-20: qty 5

## 2015-10-20 NOTE — Progress Notes (Signed)
[place] @ [facility] Telephone:([phone]) [phone]  Fax:(336) [place]: 16-Dec-1946  MR#: [id number]  VFI#:[id number]  Patient Care Team: Kathrine Haddock, NP as PCP - General (Nurse Practitioner) Robert Bellow, MD (General Surgery)  CHIEF COMPLAINT:  Chief Complaint  Patient presents with  . 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  Estrogen receptor 90%.  Progesterone receptor 60%.  Patient had lumpectomy and MammoSite partial breast radiation therapy followed by tamoxifen for 5 years.  Patient is still taking tamoxifen.. 2 recent mammogram in December of 2015 negative.  Further evaluation by surgeon revealed a palpable mass in the left upper outer quadrant near axilla.  Ultrasound revealed one small normal-appearing lymph node 0.6 cm in lower limit of axilla.  Adjacent to this there was ill-defined multilobulated hypoechoic mass 1.8 x 2.6 cm.  Biopsy of the left axillary mass was done which was positive for high-grade carcinoma.hormone receptors are pending  2,estrogen receptor positive.  Progesterone receptor positive.  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 3.  MRI scan of breast (January, 2015) reveals multiple abnormal enlarged left axillary lymph node There are 2 oval some circumscribed mass at 2:30 and 3:00 position of the left breast.  0.6 cm x 1.3 cm and 4 x 7 mm Non-mass enhancement extending 2.4 cm these masses is 3.6 cm mass.  Findings suggestive of several pulmonary nodules.  PET  scan shows extensive disease, 4.started on Taxotere,perjeta, Herceptin(January 03, 2015). 5.  Patient is on maintenance Herceptin that has been started on letrozole   And [facility] of 2016        Oncology Flowsheet 06/06/2015 06/27/2015 07/18/2015 08/15/2015 09/05/2015 09/29/2015 10/21/2015  Day, Cycle - - - - - - -  denosumab (XGEVA) [place] - - 120 mg 120 mg -  120 mg -  dexamethasone (DECADRON) IV - - - - - - -  diphenhydrAMINE (BENADRYL) PO 50 mg - 50 mg 50 mg 50 mg 50 mg 50 mg  DOCEtaxel (TAXOTERE) IV - - - - - - -  fosaprepitant (EMEND) IV - - - - - - -  palonosetron (ALOXI) IV - - - - - - -  pertuzumab (PERJETA) IV - - - - - - -  trastuzumab (HERCEPTIN) IV 6 mg/kg 6 mg/kg 6 mg/kg 6 mg/kg 6 mg/kg 6 mg/kg 6 mg/kg    INTERVAL HISTORY:  69 year old lady with 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  Patient is excellent response to Taxotere per time and Herceptin now on Herceptin and anti-hormonal maintenance therapy No bony pain. Appetite is improved.  No nausea.  No vomiting.  No diarrhea. No shortness of breath or chest pain Last MUGA scan of the heart was in July.  Last PET scan was in June.   REVIEW OF SYSTEMS:    general status: Patient is feeling weak and tired.  No change in a performance status.  No chills.  No fever. HEENT:   No evidence of stomatitis Lungs: No cough or shortness of breath Cardiac: No chest pain or paroxysmal nocturnal dyspnea GI: No nausea no vomiting no diarrhea no abdominal pain Skin: No rash Lower extremity no swelling Neurological system: No tingling.  No numbness.  No other focal signs Musculoskeletal system no bony pains  As per HPI. Otherwise, a complete review of systems is negatve.  PAST MEDICAL HISTORY: Past  Medical History  Diagnosis Date  . Hypertension 2009  . 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 Special screening for malignant neoplasms, colon   . Hyperlipidemia 1990  . Floaters 2014  . Cancer [facility]) 2010    right breast ductal carcinoma in situ  . 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 ER 90%, PR 50%, MammoSite partial breast radiation  . Breast cancer ([place]) 02/21/2009  . Malignant neoplasm of axillary tail of  female breast ([place]) 12/16/2014  . Axillary mass 12/15/2014    PAST SURGICAL HISTORY: Past Surgical History  Procedure Laterality Date  . Appendectomy  1956  . Partial hysterectomy  1977  . Colonoscopy  2008    ??? Md  . Breast surgery Right 2010    wide excision  . Breast biopsy Right 2010    stereo  . Left axilla biopsy Left 12-14-14    FAMILY HISTORY Family History  Problem Relation Age of Onset  . Cancer Other     unknown family members with ovarian,colon,breast cancers  . Hypertension Mother   . Heart disease Daughter     MI    ADVANCED DIRECTIVES:  Patient does have advance healthcare directive, Patient   does not desire to make any changes HEALTH MAINTENANCE: Social History  Substance Use Topics  . Smoking status: Never Smoker   . Smokeless tobacco: Never Used  . Alcohol Use: No      Allergies  Allergen Reactions  . Penicillin G     Other reaction(s): Pruritic rash  . Sulfa Antibiotics Rash    Other reaction(s): Weal    Current Outpatient Prescriptions  Medication Sig Dispense Refill  . ALPRAZolam (XANAX) 0.5 MG tablet TAKE 1 TABLET BY MOUTH 3 TIMES A DAY AS NEEDED 90 tablet 0  . aspirin 81 MG tablet Take 81 mg by mouth daily.    . CRESTOR 5 MG tablet Take 5 mg by mouth daily at 6 PM.     . KLOR-CON M20 20 MEQ tablet TAKE 2 TABLETS BY MOUTH EVERY DAY 60 tablet 0  . KLOR-CON M20 20 MEQ tablet TAKE 2 TABLETS BY MOUTH EVERY DAY 60 tablet 0  . letrozole (FEMARA) 2.5 MG tablet Take 1 tablet (2.5 mg total) by mouth daily.    . magnesium oxide (MAG-OX) 400 (241.3 MG) MG tablet Take 1 tablet (400 mg total) by mouth daily. 30 tablet 3  . metFORMIN (GLUCOPHAGE) 500 MG tablet Take 500 mg by mouth 2 (two) times daily with a meal.     . ondansetron (ZOFRAN) 4 MG tablet Take 4 mg by mouth every 8 (eight) hours as needed for nausea or vomiting.    . ONE TOUCH ULTRA TEST test strip     . palbociclib (IBRANCE) 125 MG capsule Take 1 capsule (125 mg total) by mouth daily  with breakfast. Take whole with food for 21 days then 7 days off.    . valsartan-hydrochlorothiazide (DIOVAN-HCT) 320-25 MG per tablet Take 1 tablet by mouth daily.      No current facility-administered medications for this visit.   Facility-Administered Medications Ordered in Other Visits  Medication Dose Route Frequency Provider Last Rate Last Dose  . heparin lock flush 100 unit/mL  250 Units Intracatheter PRN Forest Gleason, MD      . sodium chloride 0.9 % 250 mL with potassium chloride 40 mEq  infusion   Intravenous Continuous Forest Gleason, MD   Stopped at 05/03/15 1229  . sodium chloride 0.9 % injection 10 mL  10 mL Intracatheter PRN Forest Gleason, MD   10 mL at 04/18/15 1545    OBJECTIVE:  Filed Vitals:   10/20/15 1503  BP: 147/79  Pulse: 92  Temp: 96.3 F (35.7 C)     Body mass index is 26.05 kg/(m^2).    ECOG FS:1 - Symptomatic but completely ambulatory  PHYSICAL EXAM: gENERAL  status: Performance status is good.  Patient has not lost significant weight HEENT: No evidence of stomatitis. Sclera and conjunctivae :: No jaundice.   pale looking. Lungs: Air  entry equal on both sides.  No rhonchi.  No rales.  Cardiac: Heart sounds are normal.  No pericardial rub.  No murmur. Small subcutaneous nodule on the right side of the throat (chronic) Lymphatic system: Cervical, axillary, inguinal, lymph nodes not palpable GI: Abdomen is soft.  No ascites.  Liver spleen not palpable.  No tenderness.  Bowel sounds are within normal limit Lower extremity: No edema Neurological system: Higher functions, cranial nerves intact no evidence of peripheral neuropathy. Skin: No rash.  No ecchymosis.. Right breast small palpable mass in the outer lower quadrant unchanged.  Right axillary area no palpable mass.    LEFT BREAST .  FREE  of masses LAB RESULTS:  Infusion on 10/20/2015  Component Date Value Ref Range Status  . WBC 10/20/2015 5.9  3.6 - 11.0 K/uL Final  . RBC 10/20/2015 3.84  3.80 - 5.20  MIL/uL Final  . Hemoglobin 10/20/2015 12.5  12.0 - 16.0 g/dL Final  . HCT 10/20/2015 37.2  35.0 - 47.0 % Final  . MCV 10/20/2015 96.9  80.0 - 100.0 fL Final  . MCH 10/20/2015 32.6  26.0 - 34.0 pg Final  . MCHC 10/20/2015 33.7  32.0 - 36.0 g/dL Final  . RDW 10/20/2015 15.7* 11.5 - 14.5 % Final  . Platelets 10/20/2015 168  150 - 440 K/uL Final  . Neutrophils Relative % 10/20/2015 60   Final  . Neutro Abs 10/20/2015 3.6  1.4 - 6.5 K/uL Final  . Lymphocytes Relative 10/20/2015 29   Final  . Lymphs Abs 10/20/2015 1.7  1.0 - 3.6 K/uL Final  . Monocytes Relative 10/20/2015 7   Final  . Monocytes Absolute 10/20/2015 0.4  0.2 - 0.9 K/uL Final  . Eosinophils Relative 10/20/2015 3   Final  . Eosinophils Absolute 10/20/2015 0.2  0 - 0.7 K/uL Final  . Basophils Relative 10/20/2015 1   Final  . Basophils Absolute 10/20/2015 0.0  0 - 0.1 K/uL Final  . Sodium 10/20/2015 134* 135 - 145 mmol/L Final  . Potassium 10/20/2015 3.9  3.5 - 5.1 mmol/L Final  . Chloride 10/20/2015 100* 101 - 111 mmol/L Final  . CO2 10/20/2015 29  22 - 32 mmol/L Final  . Glucose, Bld 10/20/2015 125* 65 - 99 mg/dL Final  . BUN 10/20/2015 11  6 - 20 mg/dL Final  . Creatinine, Ser 10/20/2015 0.65  0.44 - 1.00 mg/dL Final  . Calcium 10/20/2015 8.7* 8.9 - 10.3 mg/dL Final  . Total Protein 10/20/2015 7.8  6.5 - 8.1 g/dL Final  . Albumin 10/20/2015 4.2  3.5 - 5.0 g/dL Final  . AST 10/20/2015 33  15 - 41 U/L Final  . ALT 10/20/2015 27  14 - 54 U/L Final  . Alkaline Phosphatase 10/20/2015 54  38 - 126 U/L Final  . Total Bilirubin 10/20/2015 0.6  0.3 - 1.2 mg/dL Final  . GFR calc non Af Amer 10/20/2015 >60  >60 mL/min Final  . GFR calc Af Amer 10/20/2015 >60  >60 mL/min Final   Comment: (NOTE) The eGFR has been calculated using the CKD EPI equation. This calculation has not been validated in all clinical situations. eGFR's persistently <60 mL/min signify possible Chronic Kidney Disease.   . Anion gap 10/20/2015 5  5 - 15 Final    FINDINGS: The left ventricular ejection fraction equals 63.3%. Previously 67%. Normal left ventricular wall motion.  IMPRESSION: Left ventricular ejection fraction equals 63.3%. Not significantly changed from previous exam. Last MUGA scan was in July of 2016  Electronically Signed  By: Kerby Moors M.D.  On: 07/12/2015 12:02 Component     Latest Ref Rng 12/16/2014 04/25/2015 06/06/2015 07/18/2015 08/15/2015  CA 27.29     0.0 - 38.6 U/mL 35.9 66.2 (H) 47.4 (H) 51.7 (H) 70.4 (H)     ASSESSMENT: STAGE 4  carcinoma of breast good response to chemotherapy on maintenance Herceptin therapy Started on letrozole and  IBRANCE  Tumor markers are slightly increased but last month was stable. O significant side effect from [facility] or Herceptin MEDICAL DECISION MAKING:  Patient had number of questions regarding changing anti-hormonal therapy Continue Herceptin therapy MUGA scan of the heart has been stable (last MUGA scan of the heart was in July of 2016) Continue Herceptin Letrozole and IBRANCE Herceptin in 4 weeks and then patient would be scheduled for repeat PET scan and MUGA scan of the heart  Continue XGEVA In T Maintenance Herceptin On clinical ground and by lab data and by PET scan criteria patient does not have any progressive disease  Patient expressed understanding and was in agreement with this plan. She also understands that She can call clinic at any time with any questions, concerns, or complaints.    Breast cancer   Staging form: Breast, AJCC 7th Edition     Clinical stage from 04/06/2015: Stage IV (T2, N2, M1) - Signed by Evlyn Kanner, NP on 04/06/2015   Forest Gleason, MD   10/21/2015 6:57 PM

## 2015-10-21 ENCOUNTER — Inpatient Hospital Stay: Payer: Medicare Other

## 2015-10-21 ENCOUNTER — Encounter: Payer: Self-pay | Admitting: Oncology

## 2015-10-21 ENCOUNTER — Other Ambulatory Visit: Payer: Self-pay | Admitting: *Deleted

## 2015-10-21 VITALS — BP 137/79 | HR 89 | Temp 96.3°F | Resp 18

## 2015-10-21 DIAGNOSIS — Z79811 Long term (current) use of aromatase inhibitors: Secondary | ICD-10-CM | POA: Diagnosis not present

## 2015-10-21 DIAGNOSIS — Z170 Estrogen receptor positive status [ER+]: Secondary | ICD-10-CM | POA: Diagnosis not present

## 2015-10-21 DIAGNOSIS — C50411 Malignant neoplasm of upper-outer quadrant of right female breast: Secondary | ICD-10-CM | POA: Diagnosis not present

## 2015-10-21 DIAGNOSIS — C7951 Secondary malignant neoplasm of bone: Secondary | ICD-10-CM | POA: Diagnosis not present

## 2015-10-21 DIAGNOSIS — R918 Other nonspecific abnormal finding of lung field: Secondary | ICD-10-CM | POA: Diagnosis not present

## 2015-10-21 DIAGNOSIS — Z5112 Encounter for antineoplastic immunotherapy: Secondary | ICD-10-CM | POA: Diagnosis not present

## 2015-10-21 DIAGNOSIS — C50619 Malignant neoplasm of axillary tail of unspecified female breast: Secondary | ICD-10-CM

## 2015-10-21 MED ORDER — HEPARIN SOD (PORK) LOCK FLUSH 100 UNIT/ML IV SOLN
500.0000 [IU] | Freq: Once | INTRAVENOUS | Status: AC | PRN
  Administered 2015-10-21: 500 [IU]

## 2015-10-21 MED ORDER — DIPHENHYDRAMINE HCL 25 MG PO CAPS
50.0000 mg | ORAL_CAPSULE | Freq: Once | ORAL | Status: AC
  Administered 2015-10-21: 50 mg via ORAL

## 2015-10-21 MED ORDER — ACETAMINOPHEN 325 MG PO TABS
ORAL_TABLET | ORAL | Status: AC
  Filled 2015-10-21: qty 2

## 2015-10-21 MED ORDER — ACETAMINOPHEN 325 MG PO TABS
650.0000 mg | ORAL_TABLET | Freq: Once | ORAL | Status: AC
  Administered 2015-10-21: 650 mg via ORAL

## 2015-10-21 MED ORDER — SODIUM CHLORIDE 0.9 % IV SOLN
Freq: Once | INTRAVENOUS | Status: AC
  Administered 2015-10-21: 10:00:00 via INTRAVENOUS
  Filled 2015-10-21: qty 1000

## 2015-10-21 MED ORDER — DIPHENHYDRAMINE HCL 25 MG PO CAPS
ORAL_CAPSULE | ORAL | Status: AC
  Filled 2015-10-21: qty 2

## 2015-10-21 MED ORDER — SODIUM CHLORIDE 0.9 % IJ SOLN
10.0000 mL | INTRAMUSCULAR | Status: DC | PRN
  Administered 2015-10-21: 10 mL
  Filled 2015-10-21: qty 10

## 2015-10-21 MED ORDER — TRASTUZUMAB CHEMO INJECTION 440 MG
6.0000 mg/kg | Freq: Once | INTRAVENOUS | Status: AC
  Administered 2015-10-21: 399 mg via INTRAVENOUS
  Filled 2015-10-21: qty 19

## 2015-11-03 ENCOUNTER — Telehealth: Payer: Self-pay | Admitting: *Deleted

## 2015-11-03 NOTE — Telephone Encounter (Signed)
Patient called to say she is having problems with her knee being sore.  She has been alternating heat and ice packs and wanted to know if this is okay.  Assured patient that what she is doing is fine.

## 2015-11-15 ENCOUNTER — Ambulatory Visit
Admission: RE | Admit: 2015-11-15 | Discharge: 2015-11-15 | Disposition: A | Discharge status: Home / Self Care | Payer: Medicare Other | Source: Ambulatory Visit | Attending: Oncology | Admitting: Oncology

## 2015-11-15 DIAGNOSIS — Z79899 Other long term (current) drug therapy: Secondary | ICD-10-CM | POA: Insufficient documentation

## 2015-11-15 DIAGNOSIS — Z136 Encounter for screening for cardiovascular disorders: Secondary | ICD-10-CM | POA: Insufficient documentation

## 2015-11-15 DIAGNOSIS — C50919 Malignant neoplasm of unspecified site of unspecified female breast: Secondary | ICD-10-CM | POA: Diagnosis not present

## 2015-11-15 DIAGNOSIS — C50619 Malignant neoplasm of axillary tail of unspecified female breast: Secondary | ICD-10-CM | POA: Insufficient documentation

## 2015-11-15 DIAGNOSIS — Z5181 Encounter for therapeutic drug level monitoring: Secondary | ICD-10-CM | POA: Diagnosis not present

## 2015-11-15 MED ORDER — TECHNETIUM TC 99M-LABELED RED BLOOD CELLS IV KIT
19.4000 | PACK | Freq: Once | INTRAVENOUS | Status: AC | PRN
  Administered 2015-11-15: 19.4 via INTRAVENOUS

## 2015-11-17 ENCOUNTER — Inpatient Hospital Stay: Payer: Medicare Other | Attending: Oncology

## 2015-11-17 ENCOUNTER — Ambulatory Visit: Payer: Medicare Other

## 2015-11-17 ENCOUNTER — Inpatient Hospital Stay: Payer: Medicare Other

## 2015-11-17 ENCOUNTER — Encounter: Payer: Self-pay | Admitting: Oncology

## 2015-11-17 ENCOUNTER — Inpatient Hospital Stay (HOSPITAL_BASED_OUTPATIENT_CLINIC_OR_DEPARTMENT_OTHER): Payer: Medicare Other | Admitting: Oncology

## 2015-11-17 VITALS — BP 155/89 | HR 92 | Temp 96.3°F | Wt 139.3 lb

## 2015-11-17 DIAGNOSIS — E785 Hyperlipidemia, unspecified: Secondary | ICD-10-CM | POA: Insufficient documentation

## 2015-11-17 DIAGNOSIS — Z79811 Long term (current) use of aromatase inhibitors: Secondary | ICD-10-CM | POA: Diagnosis not present

## 2015-11-17 DIAGNOSIS — R531 Weakness: Secondary | ICD-10-CM

## 2015-11-17 DIAGNOSIS — Z79899 Other long term (current) drug therapy: Secondary | ICD-10-CM

## 2015-11-17 DIAGNOSIS — R918 Other nonspecific abnormal finding of lung field: Secondary | ICD-10-CM | POA: Diagnosis not present

## 2015-11-17 DIAGNOSIS — C7951 Secondary malignant neoplasm of bone: Secondary | ICD-10-CM | POA: Insufficient documentation

## 2015-11-17 DIAGNOSIS — C50412 Malignant neoplasm of upper-outer quadrant of left female breast: Secondary | ICD-10-CM | POA: Insufficient documentation

## 2015-11-17 DIAGNOSIS — Z923 Personal history of irradiation: Secondary | ICD-10-CM | POA: Diagnosis not present

## 2015-11-17 DIAGNOSIS — Z7984 Long term (current) use of oral hypoglycemic drugs: Secondary | ICD-10-CM | POA: Diagnosis not present

## 2015-11-17 DIAGNOSIS — R591 Generalized enlarged lymph nodes: Secondary | ICD-10-CM | POA: Diagnosis not present

## 2015-11-17 DIAGNOSIS — Z7982 Long term (current) use of aspirin: Secondary | ICD-10-CM | POA: Diagnosis not present

## 2015-11-17 DIAGNOSIS — Z5112 Encounter for antineoplastic immunotherapy: Secondary | ICD-10-CM | POA: Insufficient documentation

## 2015-11-17 DIAGNOSIS — R5383 Other fatigue: Secondary | ICD-10-CM

## 2015-11-17 DIAGNOSIS — C50619 Malignant neoplasm of axillary tail of unspecified female breast: Secondary | ICD-10-CM

## 2015-11-17 DIAGNOSIS — I10 Essential (primary) hypertension: Secondary | ICD-10-CM | POA: Insufficient documentation

## 2015-11-17 DIAGNOSIS — Z170 Estrogen receptor positive status [ER+]: Secondary | ICD-10-CM | POA: Diagnosis not present

## 2015-11-17 DIAGNOSIS — C50919 Malignant neoplasm of unspecified site of unspecified female breast: Secondary | ICD-10-CM

## 2015-11-17 LAB — COMPREHENSIVE METABOLIC PANEL
ALBUMIN: 4.2 g/dL (ref 3.5–5.0)
ALK PHOS: 62 U/L (ref 38–126)
ALT: 30 U/L (ref 14–54)
AST: 31 U/L (ref 15–41)
Anion gap: 10 (ref 5–15)
BUN: 9 mg/dL (ref 6–20)
CALCIUM: 9.2 mg/dL (ref 8.9–10.3)
CO2: 27 mmol/L (ref 22–32)
CREATININE: 0.69 mg/dL (ref 0.44–1.00)
Chloride: 97 mmol/L — ABNORMAL LOW (ref 101–111)
GFR calc Af Amer: >60 mL/min (ref >60)
GFR calc non Af Amer: >60 mL/min (ref >60)
GLUCOSE: 142 mg/dL — AB (ref 65–99)
Potassium: 3.8 mmol/L (ref 3.5–5.1)
SODIUM: 134 mmol/L — AB (ref 135–145)
Total Bilirubin: 0.5 mg/dL (ref 0.3–1.2)
Total Protein: 7.6 g/dL (ref 6.5–8.1)

## 2015-11-17 LAB — CBC WITH DIFFERENTIAL/PLATELET
BASOS PCT: 1 %
Basophils Absolute: 0 10*3/uL (ref 0–0.1)
EOS ABS: 0.2 10*3/uL (ref 0–0.7)
Eosinophils Relative: 3 %
HEMATOCRIT: 38.1 % (ref 35.0–47.0)
HEMOGLOBIN: 12.9 g/dL (ref 12.0–16.0)
LYMPHS ABS: 1.5 10*3/uL (ref 1.0–3.6)
Lymphocytes Relative: 28 %
MCH: 32.4 pg (ref 26.0–34.0)
MCHC: 33.9 g/dL (ref 32.0–36.0)
MCV: 95.6 fL (ref 80.0–100.0)
Monocytes Absolute: 0.5 10*3/uL (ref 0.2–0.9)
Monocytes Relative: 9 %
NEUTROS ABS: 3.1 10*3/uL (ref 1.4–6.5)
NEUTROS PCT: 59 %
Platelets: 149 10*3/uL — ABNORMAL LOW (ref 150–440)
RBC: 3.98 MIL/uL (ref 3.80–5.20)
RDW: 14.3 % (ref 11.5–14.5)
WBC: 5.3 10*3/uL (ref 3.6–11.0)

## 2015-11-17 LAB — MAGNESIUM: Magnesium: 2 mg/dL (ref 1.7–2.4)

## 2015-11-17 MED ORDER — ACETAMINOPHEN 325 MG PO TABS
650.0000 mg | ORAL_TABLET | Freq: Once | ORAL | Status: AC
  Administered 2015-11-17: 650 mg via ORAL
  Filled 2015-11-17: qty 2

## 2015-11-17 MED ORDER — HEPARIN SOD (PORK) LOCK FLUSH 100 UNIT/ML IV SOLN: 500.0000 [IU] | Freq: Once | INTRAVENOUS | Status: AC | PRN

## 2015-11-17 MED ORDER — DIPHENHYDRAMINE HCL 25 MG PO CAPS
50.0000 mg | ORAL_CAPSULE | Freq: Once | ORAL | Status: AC
  Administered 2015-11-17: 50 mg via ORAL
  Filled 2015-11-17: qty 2

## 2015-11-17 MED ORDER — SODIUM CHLORIDE 0.9 % IV SOLN
Freq: Once | INTRAVENOUS | Status: AC
  Administered 2015-11-17: 15:00:00 via INTRAVENOUS
  Filled 2015-11-17: qty 1000

## 2015-11-17 MED ORDER — DENOSUMAB 120 MG/1.7ML ~~LOC~~ SOLN: 120.0000 mg | Freq: Once | SUBCUTANEOUS | Status: DC

## 2015-11-17 MED ORDER — DENOSUMAB 120 MG/1.7ML ~~LOC~~ SOLN
120.0000 mg | Freq: Once | SUBCUTANEOUS | Status: AC
  Administered 2015-11-17: 120 mg via SUBCUTANEOUS
  Filled 2015-11-17: qty 1.7

## 2015-11-17 MED ORDER — SODIUM CHLORIDE 0.9 % IJ SOLN
10.0000 mL | INTRAMUSCULAR | Status: DC | PRN
  Administered 2015-11-17: 10 mL
  Filled 2015-11-17: qty 10

## 2015-11-17 MED ORDER — TRASTUZUMAB CHEMO INJECTION 440 MG
6.0000 mg/kg | Freq: Once | INTRAVENOUS | Status: AC
  Administered 2015-11-17: 399 mg via INTRAVENOUS
  Filled 2015-11-17: qty 19

## 2015-11-17 MED ORDER — SODIUM CHLORIDE 0.9 % IJ SOLN
10.0000 mL | INTRAMUSCULAR | Status: DC | PRN
  Administered 2015-11-17: 10 mL via INTRAVENOUS
  Filled 2015-11-17: qty 10

## 2015-11-17 MED ORDER — HEPARIN SOD (PORK) LOCK FLUSH 100 UNIT/ML IV SOLN
500.0000 [IU] | Freq: Once | INTRAVENOUS | Status: AC
  Administered 2015-11-17: 500 [IU] via INTRAVENOUS
  Filled 2015-11-17: qty 5

## 2015-11-17 NOTE — Progress Notes (Signed)
[place] @ [facility] [phone]) [phone]  Fax:(336) [place]: [birth year]  MR#: [id number]  YSA#:[id number]  Patient Care Team: Kathrine Haddock, NP as PCP - General (Nurse Practitioner) Robert Bellow, MD (General Surgery)  CHIEF COMPLAINT:  Chief Complaint  Patient presents with  . 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  Estrogen receptor 90%.  Progesterone receptor 60%.  Patient had lumpectomy and MammoSite partial breast radiation therapy followed by tamoxifen for 5 years.  Patient is still taking tamoxifen.. 2 recent mammogram in December of 2015 negative.  Further evaluation by surgeon revealed a palpable mass in the left upper outer quadrant near axilla.  Ultrasound revealed one small normal-appearing lymph node 0.6 cm in lower limit of axilla.  Adjacent to this there was ill-defined multilobulated hypoechoic mass 1.8 x 2.6 cm.  Biopsy of the left axillary mass was done which was positive for high-grade carcinoma.hormone receptors are pending  2,estrogen receptor positive.  Progesterone receptor positive.  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 3.  MRI scan of breast (January, 2015) reveals multiple abnormal enlarged left axillary lymph node There are 2 oval some circumscribed mass at 2:30 and 3:00 position of the left breast.  0.6 cm x 1.3 cm and 4 x 7 mm Non-mass enhancement extending 2.4 cm these masses is 3.6 cm mass.  Findings suggestive of several pulmonary nodules.  PET  scan shows extensive disease, 4.started on Taxotere,perjeta, Herceptin(January 03, 2015). 5.  Patient is on maintenance Herceptin that has been started on letrozole   And [facility] 2016        Oncology Flowsheet 06/06/2015 06/27/2015 07/18/2015 08/15/2015 09/05/2015 09/29/2015 10/21/2015  Day, Cycle - - - - - - -  denosumab (XGEVA) [place] - - 120 mg  120 mg - 120 mg -  dexamethasone (DECADRON) IV - - - - - - -  diphenhydrAMINE (BENADRYL) PO 50 mg - 50 mg 50 mg 50 mg 50 mg 50 mg  DOCEtaxel (TAXOTERE) IV - - - - - - -  fosaprepitant (EMEND) IV - - - - - - -  palonosetron (ALOXI) IV - - - - - - -  pertuzumab (PERJETA) IV - - - - - - -  trastuzumab (HERCEPTIN) IV 6 mg/kg 6 mg/kg 6 mg/kg 6 mg/kg 6 mg/kg 6 mg/kg 6 mg/kg    INTERVAL HISTORY:  69 year old lady with 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  Patient is excellent response to Taxotere per time and Herceptin now on Herceptin and anti-hormonal maintenance therapy No bony pain. Appetite is improved.  No nausea.  No vomiting.  No diarrhea. No shortness of breath or chest pain Patient had a repeat MUGA scan of the heart.  Here for further follow-up and continuation of treatment.  No chills.  No fever.  Getting extremely well for bone metastases.     REVIEW OF SYSTEMS:    general status: Patient is feeling weak and tired.  No change in a performance status.  No chills.  No fever. HEENT:   No evidence of stomatitis Lungs: No cough or shortness of breath Cardiac: No chest pain or paroxysmal nocturnal dyspnea GI: No nausea no vomiting no diarrhea no abdominal pain Skin: No rash Lower extremity no swelling Neurological system: No tingling.  No numbness.  No other focal signs Musculoskeletal system no bony  pains  As per HPI. Otherwise, a complete review of systems is negatve.  PAST MEDICAL HISTORY: Past Medical History  Diagnosis Date  . Hypertension 2009  . 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 Special screening for malignant neoplasms, colon   . Hyperlipidemia 1990  . Floaters 2014  . Cancer [facility]) 2010    right breast ductal carcinoma in situ  . 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 ER 90%, PR  50%, MammoSite partial breast radiation  . Breast cancer ([place]) 02/21/2009  . Malignant neoplasm of axillary tail of female breast ([place]) 12/16/2014  . Axillary mass 12/15/2014    PAST SURGICAL HISTORY: Past Surgical History  Procedure Laterality Date  . Appendectomy  1956  . Partial hysterectomy  1977  . Colonoscopy  2008    ??? Md  . Breast surgery Right 2010    wide excision  . Breast biopsy Right 2010    stereo  . Left axilla biopsy Left 12-14-14    FAMILY HISTORY Family History  Problem Relation Age of Onset  . Cancer Other     unknown family members with ovarian,colon,breast cancers  . Hypertension Mother   . Heart disease Daughter     MI    ADVANCED DIRECTIVES:  Patient does have advance healthcare directive, Patient   does not desire to make any changes HEALTH MAINTENANCE: Social History  Substance Use Topics  . Smoking status: Never Smoker   . Smokeless tobacco: Never Used  . Alcohol Use: No      Allergies  Allergen Reactions  . Penicillin G     Other reaction(s): Pruritic rash  . Sulfa Antibiotics Rash    Other reaction(s): Weal    Current Outpatient Prescriptions  Medication Sig Dispense Refill  . ALPRAZolam (XANAX) 0.5 MG tablet TAKE 1 TABLET BY MOUTH 3 TIMES A DAY AS NEEDED 90 tablet 0  . aspirin 81 MG tablet Take 81 mg by mouth daily.    . CRESTOR 5 MG tablet Take 5 mg by mouth daily at 6 PM.     . KLOR-CON M20 20 MEQ tablet TAKE 2 TABLETS BY MOUTH EVERY DAY 60 tablet 0  . KLOR-CON M20 20 MEQ tablet TAKE 2 TABLETS BY MOUTH EVERY DAY 60 tablet 0  . letrozole (FEMARA) 2.5 MG tablet Take 1 tablet (2.5 mg total) by mouth daily.    . magnesium oxide (MAG-OX) 400 (241.3 MG) MG tablet Take 1 tablet (400 mg total) by mouth daily. 30 tablet 3  . metFORMIN (GLUCOPHAGE) 500 MG tablet Take 500 mg by mouth 2 (two) times daily with a meal.     . ondansetron (ZOFRAN) 4 MG tablet Take 4 mg by mouth every 8 (eight) hours as needed for nausea or vomiting.    . ONE  TOUCH ULTRA TEST test strip     . palbociclib (IBRANCE) 125 MG capsule Take 1 capsule (125 mg total) by mouth daily with breakfast. Take whole with food for 21 days then 7 days off.    . valsartan-hydrochlorothiazide (DIOVAN-HCT) 320-25 MG per tablet Take 1 tablet by mouth daily.      No current facility-administered medications for this visit.   Facility-Administered Medications Ordered in Other Visits  Medication Dose Route Frequency Provider Last Rate Last Dose  . heparin lock flush 100 unit/mL  250 Units Intracatheter PRN Forest Gleason,  MD      . heparin lock flush 100 unit/mL  500 Units Intravenous Once Forest Gleason, MD      . sodium chloride 0.9 % 250 mL with potassium chloride 40 mEq infusion   Intravenous Continuous Forest Gleason, MD   Stopped at 05/03/15 1229  . sodium chloride 0.9 % injection 10 mL  10 mL Intracatheter PRN Forest Gleason, MD   10 mL at 04/18/15 1545  . sodium chloride 0.9 % injection 10 mL  10 mL Intravenous PRN Forest Gleason, MD   10 mL at 11/17/15 1334    OBJECTIVE:  Filed Vitals:   11/17/15 1346  BP: 155/89  Pulse: 92  Temp: 96.3 F (35.7 C)     Body mass index is 26.34 kg/(m^2).    ECOG FS:1 - Symptomatic but completely ambulatory  PHYSICAL EXAM: gENERAL  status: Performance status is good.  Patient has not lost significant weight HEENT: No evidence of stomatitis. Sclera and conjunctivae :: No jaundice.   pale looking. Lungs: Air  entry equal on both sides.  No rhonchi.  No rales.  Cardiac: Heart sounds are normal.  No pericardial rub.  No murmur. Small subcutaneous nodule on the right side of the throat (chronic) Lymphatic system: Cervical, axillary, inguinal, lymph nodes not palpable GI: Abdomen is soft.  No ascites.  Liver spleen not palpable.  No tenderness.  Bowel sounds are within normal limit Lower extremity: No edema Neurological system: Higher functions, cranial nerves intact no evidence of peripheral neuropathy. Skin: No rash.  No  ecchymosis.. Right breast small palpable mass in the outer lower quadrant unchanged.  Right axillary area no palpable mass.    LEFT BREAST .  FREE  of masses LAB RESULTS:  Infusion on 11/17/2015  Component Date Value Ref Range Status  . WBC 11/17/2015 5.3  3.6 - 11.0 K/uL Final   A-LINE DRAW  . RBC 11/17/2015 3.98  3.80 - 5.20 MIL/uL Final  . Hemoglobin 11/17/2015 12.9  12.0 - 16.0 g/dL Final  . HCT 11/17/2015 38.1  35.0 - 47.0 % Final  . MCV 11/17/2015 95.6  80.0 - 100.0 fL Final  . MCH 11/17/2015 32.4  26.0 - 34.0 pg Final  . MCHC 11/17/2015 33.9  32.0 - 36.0 g/dL Final  . RDW 11/17/2015 14.3  11.5 - 14.5 % Final  . Platelets 11/17/2015 149* 150 - 440 K/uL Final  . Neutrophils Relative % 11/17/2015 59   Final  . Neutro Abs 11/17/2015 3.1  1.4 - 6.5 K/uL Final  . Lymphocytes Relative 11/17/2015 28   Final  . Lymphs Abs 11/17/2015 1.5  1.0 - 3.6 K/uL Final  . Monocytes Relative 11/17/2015 9   Final  . Monocytes Absolute 11/17/2015 0.5  0.2 - 0.9 K/uL Final  . Eosinophils Relative 11/17/2015 3   Final  . Eosinophils Absolute 11/17/2015 0.2  0 - 0.7 K/uL Final  . Basophils Relative 11/17/2015 1   Final  . Basophils Absolute 11/17/2015 0.0  0 - 0.1 K/uL Final  . Sodium 11/17/2015 134* 135 - 145 mmol/L Final  . Potassium 11/17/2015 3.8  3.5 - 5.1 mmol/L Final  . Chloride 11/17/2015 97* 101 - 111 mmol/L Final  . CO2 11/17/2015 27  22 - 32 mmol/L Final  . Glucose, Bld 11/17/2015 142* 65 - 99 mg/dL Final  . BUN 11/17/2015 9  6 - 20 mg/dL Final  . Creatinine, Ser 11/17/2015 0.69  0.44 - 1.00 mg/dL Final  . Calcium 11/17/2015 9.2  8.9 -  10.3 mg/dL Final  . Total Protein 11/17/2015 7.6  6.5 - 8.1 g/dL Final  . Albumin 11/17/2015 4.2  3.5 - 5.0 g/dL Final  . AST 11/17/2015 31  15 - 41 U/L Final  . ALT 11/17/2015 30  14 - 54 U/L Final  . Alkaline Phosphatase 11/17/2015 62  38 - 126 U/L Final  . Total Bilirubin 11/17/2015 0.5  0.3 - 1.2 mg/dL Final  . GFR calc non Af Amer 11/17/2015 >60   >60 mL/min Final  . GFR calc Af Amer 11/17/2015 >60  >60 mL/min Final   Comment: (NOTE) The eGFR has been calculated using the CKD EPI equation. This calculation has not been validated in all clinical situations. eGFR's persistently <60 mL/min signify possible Chronic Kidney Disease.   . Anion gap 11/17/2015 10  5 - 15 Final  . Magnesium 11/17/2015 2.0  1.7 - 2.4 mg/dL Final   FINDINGS: The left ventricular ejection fraction equals 63.3%. Previously 67%. Normal left ventricular wall motion.  IMPRESSION: Left ventricular ejection fraction equals 63.3%. Not significantly changed from previous exam. Last MUGA scan was in July of 2016  Electronically Signed  By: Kerby Moors M.D.  On: 07/12/2015 12:02 Component     Latest Ref Rng 12/16/2014 04/25/2015 06/06/2015 07/18/2015 08/15/2015  CA 27.29     0.0 - 38.6 U/mL 35.9 66.2 (H) 47.4 (H) 51.7 (H) 70.4 (H)   The eighth 27.29 on December 1 was 65.1 IMPRESSION: Normal LEFT ventricular ejection fraction of 58% slightly decreased from the 63% on the previous study.  Normal LV wall motion.   Electronically Signed  By: Lavonia Dana M.D.  On: 11/15/2015 12:22  ASSESSMENT: STAGE 4  carcinoma of breast good response to chemotherapy on maintenance Herceptin therapy Started on letrozole and  IBRANCE  Tumor markers are slightly increased but last month was stable. No  significant side effect from [facility] or Herceptin MEDICAL DECISION MAKING:  Patient had number of questions regarding changing anti-hormonal therapy Continue Herceptin therapy MUGA scan of the heart has been stablewas done recently.has been reviewed which was done on November 29 ejection fraction is 63% and stable Tumor marker shows continuing response Continue IBRANCE.  Continue Herceptin and letrozole.  Repeat PET scan would be scheduled sometime in [place] will February.  Continue Herceptin Letrozole and IBRANCE Herceptin in 3eeks and then patient would be  scheduled for repeat PET scan and MUGA scan of the heart  Continue XGEVA In T Maintenance Herceptin On clinical ground and by lab data and by PET scan criteria patient does not have any progressive disease  Patient expressed understanding and was in agreement with this plan. She also understands that She can call clinic at any time with any questions, concerns, or complaints.    Breast cancer   Staging form: Breast, AJCC 7th Edition     Clinical stage from 04/06/2015: Stage IV (T2, N2, M1) - Signed by Evlyn Kanner, NP on 04/06/2015   Forest Gleason, MD   11/17/2015 1:58 PM

## 2015-11-18 ENCOUNTER — Encounter: Payer: Self-pay | Admitting: Oncology

## 2015-11-18 LAB — CANCER ANTIGEN 27.29: CA 27.29: 65.1 U/mL — AB (ref 0.0–38.6)

## 2015-12-05 ENCOUNTER — Telehealth: Payer: Self-pay | Admitting: *Deleted

## 2015-12-08 ENCOUNTER — Inpatient Hospital Stay: Payer: Medicare Other

## 2015-12-08 ENCOUNTER — Inpatient Hospital Stay (HOSPITAL_BASED_OUTPATIENT_CLINIC_OR_DEPARTMENT_OTHER): Payer: Medicare Other | Admitting: Oncology

## 2015-12-08 ENCOUNTER — Ambulatory Visit: Payer: Medicare Other

## 2015-12-08 ENCOUNTER — Encounter: Payer: Self-pay | Admitting: Oncology

## 2015-12-08 VITALS — BP 159/89 | HR 93 | Temp 97.2°F | Resp 18 | Wt 137.6 lb

## 2015-12-08 DIAGNOSIS — Z170 Estrogen receptor positive status [ER+]: Secondary | ICD-10-CM | POA: Diagnosis not present

## 2015-12-08 DIAGNOSIS — E785 Hyperlipidemia, unspecified: Secondary | ICD-10-CM

## 2015-12-08 DIAGNOSIS — C50412 Malignant neoplasm of upper-outer quadrant of left female breast: Secondary | ICD-10-CM

## 2015-12-08 DIAGNOSIS — Z7982 Long term (current) use of aspirin: Secondary | ICD-10-CM

## 2015-12-08 DIAGNOSIS — C7951 Secondary malignant neoplasm of bone: Secondary | ICD-10-CM

## 2015-12-08 DIAGNOSIS — Z79811 Long term (current) use of aromatase inhibitors: Secondary | ICD-10-CM | POA: Diagnosis not present

## 2015-12-08 DIAGNOSIS — R918 Other nonspecific abnormal finding of lung field: Secondary | ICD-10-CM

## 2015-12-08 DIAGNOSIS — Z923 Personal history of irradiation: Secondary | ICD-10-CM

## 2015-12-08 DIAGNOSIS — C50919 Malignant neoplasm of unspecified site of unspecified female breast: Secondary | ICD-10-CM

## 2015-12-08 DIAGNOSIS — C50619 Malignant neoplasm of axillary tail of unspecified female breast: Secondary | ICD-10-CM

## 2015-12-08 DIAGNOSIS — Z79899 Other long term (current) drug therapy: Secondary | ICD-10-CM

## 2015-12-08 DIAGNOSIS — R5383 Other fatigue: Secondary | ICD-10-CM

## 2015-12-08 DIAGNOSIS — Z7984 Long term (current) use of oral hypoglycemic drugs: Secondary | ICD-10-CM

## 2015-12-08 DIAGNOSIS — I10 Essential (primary) hypertension: Secondary | ICD-10-CM

## 2015-12-08 DIAGNOSIS — Z5112 Encounter for antineoplastic immunotherapy: Secondary | ICD-10-CM | POA: Diagnosis not present

## 2015-12-08 DIAGNOSIS — R531 Weakness: Secondary | ICD-10-CM

## 2015-12-08 DIAGNOSIS — R591 Generalized enlarged lymph nodes: Secondary | ICD-10-CM

## 2015-12-08 LAB — COMPREHENSIVE METABOLIC PANEL
ALBUMIN: 4.2 g/dL (ref 3.5–5.0)
ALK PHOS: 65 U/L (ref 38–126)
ALT: 23 U/L (ref 14–54)
AST: 25 U/L (ref 15–41)
Anion gap: 8 (ref 5–15)
BUN: 10 mg/dL (ref 6–20)
CALCIUM: 9.1 mg/dL (ref 8.9–10.3)
CO2: 27 mmol/L (ref 22–32)
CREATININE: 0.6 mg/dL (ref 0.44–1.00)
Chloride: 98 mmol/L — ABNORMAL LOW (ref 101–111)
GFR calc Af Amer: >60 mL/min (ref >60)
GFR calc non Af Amer: >60 mL/min (ref >60)
GLUCOSE: 162 mg/dL — AB (ref 65–99)
Potassium: 3.4 mmol/L — ABNORMAL LOW (ref 3.5–5.1)
SODIUM: 133 mmol/L — AB (ref 135–145)
Total Bilirubin: 0.7 mg/dL (ref 0.3–1.2)
Total Protein: 7.7 g/dL (ref 6.5–8.1)

## 2015-12-08 LAB — CBC WITH DIFFERENTIAL/PLATELET
BASOS PCT: 1 %
Basophils Absolute: 0 10*3/uL (ref 0–0.1)
EOS ABS: 0.2 10*3/uL (ref 0–0.7)
Eosinophils Relative: 3 %
HCT: 39.3 % (ref 35.0–47.0)
HEMOGLOBIN: 13.2 g/dL (ref 12.0–16.0)
Lymphocytes Relative: 26 %
Lymphs Abs: 1.4 10*3/uL (ref 1.0–3.6)
MCH: 31.5 pg (ref 26.0–34.0)
MCHC: 33.6 g/dL (ref 32.0–36.0)
MCV: 93.8 fL (ref 80.0–100.0)
Monocytes Absolute: 0.4 10*3/uL (ref 0.2–0.9)
Monocytes Relative: 7 %
NEUTROS ABS: 3.4 10*3/uL (ref 1.4–6.5)
NEUTROS PCT: 63 %
Platelets: 158 10*3/uL (ref 150–440)
RBC: 4.18 MIL/uL (ref 3.80–5.20)
RDW: 14.1 % (ref 11.5–14.5)
WBC: 5.4 10*3/uL (ref 3.6–11.0)

## 2015-12-08 MED ORDER — TRASTUZUMAB CHEMO INJECTION 440 MG
6.0000 mg/kg | Freq: Once | INTRAVENOUS | Status: AC
  Administered 2015-12-08: 399 mg via INTRAVENOUS
  Filled 2015-12-08: qty 19

## 2015-12-08 MED ORDER — DIPHENHYDRAMINE HCL 25 MG PO CAPS
50.0000 mg | ORAL_CAPSULE | Freq: Once | ORAL | Status: AC
  Administered 2015-12-08: 50 mg via ORAL
  Filled 2015-12-08: qty 2

## 2015-12-08 MED ORDER — SODIUM CHLORIDE 0.9 % IV SOLN
Freq: Once | INTRAVENOUS | Status: AC
  Administered 2015-12-08: 15:00:00 via INTRAVENOUS
  Filled 2015-12-08: qty 1000

## 2015-12-08 MED ORDER — HEPARIN SOD (PORK) LOCK FLUSH 100 UNIT/ML IV SOLN
500.0000 [IU] | Freq: Once | INTRAVENOUS | Status: AC | PRN
  Administered 2015-12-08: 500 [IU]
  Filled 2015-12-08: qty 5

## 2015-12-08 MED ORDER — ACETAMINOPHEN 325 MG PO TABS
650.0000 mg | ORAL_TABLET | Freq: Once | ORAL | Status: AC
  Administered 2015-12-08: 650 mg via ORAL
  Filled 2015-12-08: qty 2

## 2015-12-08 NOTE — Progress Notes (Signed)
[place] @ [facility] Telephone:(336) [phone]  Fax:(336) [place]: 1946/09/08  MR#: [id number]  SWN#:[id number]  Patient Care Team: Kathrine Haddock, NP as PCP - General (Nurse Practitioner) Robert Bellow, MD (General Surgery)  CHIEF COMPLAINT:  Chief Complaint  Patient presents with  . 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  Estrogen receptor 90%.  Progesterone receptor 60%.  Patient had lumpectomy and MammoSite partial breast radiation therapy followed by tamoxifen for 5 years.  Patient is still taking tamoxifen.. 2 recent mammogram in December of 2015 negative.  Further evaluation by surgeon revealed a palpable mass in the left upper outer quadrant near axilla.  Ultrasound revealed one small normal-appearing lymph node 0.6 cm in lower limit of axilla.  Adjacent to this there was ill-defined multilobulated hypoechoic mass 1.8 x 2.6 cm.  Biopsy of the left axillary mass was done which was positive for high-grade carcinoma.hormone receptors are pending  2,estrogen receptor positive.  Progesterone receptor positive.  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 3.  MRI scan of breast (January, 2015) reveals multiple abnormal enlarged left axillary lymph node There are 2 oval some circumscribed mass at 2:30 and 3:00 position of the left breast.  0.6 cm x 1.3 cm and 4 x 7 mm Non-mass enhancement extending 2.4 cm these masses is 3.6 cm mass.  Findings suggestive of several pulmonary nodules.  PET  scan shows extensive disease, 4.started on Taxotere,perjeta, Herceptin(January 03, 2015). 5.  Patient is on maintenance Herceptin that has been started on letrozole   And [facility]        Oncology Flowsheet 06/27/2015 07/18/2015 08/15/2015 09/05/2015 09/29/2015 10/21/2015 11/17/2015  Day, Cycle - - - - - - -  denosumab (XGEVA) [place] - 120 mg 120  mg - 120 mg - 120 mg  dexamethasone (DECADRON) IV - - - - - - -  diphenhydrAMINE (BENADRYL) PO - 50 _0  mg  DOCEtaxel (TAXOTERE) IV - - - - - - -  fosaprepitant (EMEND) IV - - - - - - -  palonosetron (ALOXI) IV - - - - - - -  pertuzumab (PERJETA) IV - - - - - - -  trastuzumab (HERCEPTIN) IV 6 mg/kg 6 mg/kg 6 mg/kg 6 mg/kg 6 mg/kg 6 mg/kg 6 mg/kg    INTERVAL HISTORY:  69 year old lady with 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  Patient is excellent response to Taxotere per time and Herceptin now on Herceptin and anti-hormonal maintenance therapy No bony pain. Appetite is improved.  No nausea.  No vomiting.  No diarrhea. No shortness of breath or chest pain Patient had a repeat MUGA scan of the heart.  Here for further follow-up and continuation of treatment.  No chills.  No fever.  Getting extremely well for bone metastases. Patient is here for further follow-up regarding stage IV carcinoma of breast. Patient received XGEVA on December 1 No chills no fever no nausea no vomiting no diarrhea. Taking  BRANCE and letrozole     REVIEW OF SYSTEMS:    general status: Patient is feeling weak and tired.  No change in a performance status.  No chills.  No fever. HEENT:   No evidence of stomatitis Lungs: No cough or shortness of breath Cardiac: No chest pain or paroxysmal nocturnal  dyspnea GI: No nausea no vomiting no diarrhea no abdominal pain Skin: No rash Lower extremity no swelling Neurological system: No tingling.  No numbness.  No other focal signs Musculoskeletal system no bony pains  As per HPI. Otherwise, a complete review of systems is negatve.  PAST MEDICAL HISTORY: Past Medical History  Diagnosis Date  . Hypertension 2009  . 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 Special screening for malignant neoplasms, colon   . Hyperlipidemia 1990  . Floaters 2014  . Cancer [facility]) 2010    right  breast ductal carcinoma in situ  . 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 ER 90%, PR 50%, MammoSite partial breast radiation  . Breast cancer ([place]) 02/21/2009  . Malignant neoplasm of axillary tail of female breast ([place]) 12/16/2014  . Axillary mass 12/15/2014    PAST SURGICAL HISTORY: Past Surgical History  Procedure Laterality Date  . Appendectomy  1956  . Partial hysterectomy  1977  . Colonoscopy  2008    ??? Md  . Breast surgery Right 2010    wide excision  . Breast biopsy Right 2010    stereo  . Left axilla biopsy Left 12-14-14    FAMILY HISTORY Family History  Problem Relation Age of Onset  . Cancer Other     unknown family members with ovarian,colon,breast cancers  . Hypertension Mother   . Heart disease Daughter     MI    ADVANCED DIRECTIVES:  Patient does have advance healthcare directive, Patient   does not desire to make any changes HEALTH MAINTENANCE: Social History  Substance Use Topics  . Smoking status: Never Smoker   . Smokeless tobacco: Never Used  . Alcohol Use: No      Allergies  Allergen Reactions  . Penicillin G     Other reaction(s): Pruritic rash  . Sulfa Antibiotics Rash    Other reaction(s): Weal    Current Outpatient Prescriptions  Medication Sig Dispense Refill  . ALPRAZolam (XANAX) 0.5 MG tablet TAKE 1 TABLET BY MOUTH 3 TIMES A DAY AS NEEDED 90 tablet 0  . aspirin 81 MG tablet Take 81 mg by mouth daily.    . CRESTOR 5 MG tablet Take 5 mg by mouth daily at 6 PM.     . KLOR-CON M20 20 MEQ tablet TAKE 2 TABLETS BY MOUTH EVERY DAY 60 tablet 0  . KLOR-CON M20 20 MEQ tablet TAKE 2 TABLETS BY MOUTH EVERY DAY 60 tablet 0  . letrozole (FEMARA) 2.5 MG tablet Take 1 tablet (2.5 mg total) by mouth daily.    . magnesium oxide (MAG-OX) 400 (241.3 MG) MG tablet Take 1 tablet (400 mg total) by mouth daily. 30 tablet 3  .  metFORMIN (GLUCOPHAGE) 500 MG tablet Take 500 mg by mouth 2 (two) times daily with a meal.     . ondansetron (ZOFRAN) 4 MG tablet Take 4 mg by mouth every 8 (eight) hours as needed for nausea or vomiting.    . ONE TOUCH ULTRA TEST test strip     . palbociclib (IBRANCE) 125 MG capsule Take 1 capsule (125 mg total) by mouth daily with breakfast. Take whole with food for 21 days then 7 days off.    . valsartan-hydrochlorothiazide (DIOVAN-HCT) 320-25 MG per tablet Take 1 tablet by mouth daily.      No current facility-administered medications for  this visit.   Facility-Administered Medications Ordered in Other Visits  Medication Dose Route Frequency Provider Last Rate Last Dose  . heparin lock flush 100 unit/mL  250 Units Intracatheter PRN Forest Gleason, MD      . sodium chloride 0.9 % 250 mL with potassium chloride 40 mEq infusion   Intravenous Continuous Forest Gleason, MD   Stopped at 05/03/15 1229  . sodium chloride 0.9 % injection 10 mL  10 mL Intracatheter PRN Forest Gleason, MD   10 mL at 04/18/15 1545    OBJECTIVE:  Filed Vitals:   12/08/15 1340  BP: 159/89  Pulse: 93  Temp: 97.2 F (36.2 C)  Resp: 18     Body mass index is 26.01 kg/(m^2).    ECOG FS:1 - Symptomatic but completely ambulatory  PHYSICAL EXAM: gENERAL  status: Performance status is good.  Patient has not lost significant weight HEENT: No evidence of stomatitis. Sclera and conjunctivae :: No jaundice.   pale looking. Lungs: Air  entry equal on both sides.  No rhonchi.  No rales.  Cardiac: Heart sounds are normal.  No pericardial rub.  No murmur. Small subcutaneous nodule on the right side of the throat (chronic) Lymphatic system: Cervical, axillary, inguinal, lymph nodes not palpable GI: Abdomen is soft.  No ascites.  Liver spleen not palpable.  No tenderness.  Bowel sounds are within normal limit Lower extremity: No edema Neurological system: Higher functions, cranial nerves intact no evidence of peripheral  neuropathy. Skin: No rash.  No ecchymosis.. Right breast small palpable mass in the outer lower quadrant unchanged.  Right axillary area no palpable mass.    LEFT BREAST .  FREE  of masses LAB RESULTS:  Infusion on 12/08/2015  Component Date Value Ref Range Status  . WBC 12/08/2015 5.4  3.6 - 11.0 K/uL Final  . RBC 12/08/2015 4.18  3.80 - 5.20 MIL/uL Final  . Hemoglobin 12/08/2015 13.2  12.0 - 16.0 g/dL Final  . HCT 12/08/2015 39.3  35.0 - 47.0 % Final  . MCV 12/08/2015 93.8  80.0 - 100.0 fL Final  . MCH 12/08/2015 31.5  26.0 - 34.0 pg Final  . MCHC 12/08/2015 33.6  32.0 - 36.0 g/dL Final  . RDW 12/08/2015 14.1  11.5 - 14.5 % Final  . Platelets 12/08/2015 158  150 - 440 K/uL Final  . Neutrophils Relative % 12/08/2015 63   Final  . Neutro Abs 12/08/2015 3.4  1.4 - 6.5 K/uL Final  . Lymphocytes Relative 12/08/2015 26   Final  . Lymphs Abs 12/08/2015 1.4  1.0 - 3.6 K/uL Final  . Monocytes Relative 12/08/2015 7   Final  . Monocytes Absolute 12/08/2015 0.4  0.2 - 0.9 K/uL Final  . Eosinophils Relative 12/08/2015 3   Final  . Eosinophils Absolute 12/08/2015 0.2  0 - 0.7 K/uL Final  . Basophils Relative 12/08/2015 1   Final  . Basophils Absolute 12/08/2015 0.0  0 - 0.1 K/uL Final  . Sodium 12/08/2015 133* 135 - 145 mmol/L Final  . Potassium 12/08/2015 3.4* 3.5 - 5.1 mmol/L Final  . Chloride 12/08/2015 98* 101 - 111 mmol/L Final  . CO2 12/08/2015 27  22 - 32 mmol/L Final  . Glucose, Bld 12/08/2015 162* 65 - 99 mg/dL Final  . BUN 12/08/2015 10  6 - 20 mg/dL Final  . Creatinine, Ser 12/08/2015 0.60  0.44 - 1.00 mg/dL Final  . Calcium 12/08/2015 9.1  8.9 - 10.3 mg/dL Final  . Total Protein 12/08/2015 7.7  6.5 - 8.1 g/dL Final  . Albumin 12/08/2015 4.2  3.5 - 5.0 g/dL Final  . AST 12/08/2015 25  15 - 41 U/L Final  . ALT 12/08/2015 23  14 - 54 U/L Final  . Alkaline Phosphatase 12/08/2015 65  38 - 126 U/L Final  . Total Bilirubin 12/08/2015 0.7  0.3 - 1.2 mg/dL Final  . GFR calc non Af Amer  12/08/2015 >60  >60 mL/min Final  . GFR calc Af Amer 12/08/2015 >60  >60 mL/min Final   Comment: (NOTE) The eGFR has been calculated using the CKD EPI equation. This calculation has not been validated in all clinical situations. eGFR's persistently <60 mL/min signify possible Chronic Kidney Disease.   . Anion gap 12/08/2015 8  5 - 15 Final   Last MUGA scan was in November of 2016. Normal left ventricular ejection fraction.  58% slightly decreased from the previous 63%.  Electronically Signed  By: Kerby Moors M.D.  On: 07/12/2015 12:02 Component     Latest Ref Rng 12/16/2014 04/25/2015 06/06/2015 07/18/2015 08/15/2015  CA 27.29     0.0 - 38.6 U/mL 35.9 66.2 (H) 47.4 (H) 51.7 (H) 70.4 (H)         ASSESSMENT: STAGE 4  carcinoma of breast good response to chemotherapy on maintenance Herceptin therapy Started on letrozole and  IBRANCE  Tumor markers are slightly increased but last month was stable. No  significant side effect from [facility] or Herceptin MEDICAL DECISION MAKING:   All lab data has been reviewed. No evidence of myelosuppression Continue IBRANCE letrozole and Herceptin  By tumor markers criteria tumor is stable.  Will repeat CA-27-29 and if needed PET scan by January.      Breast cancer   Staging form: Breast, AJCC 7th Edition     Clinical stage from 04/06/2015: Stage IV (T2, N2, M1) - Signed by Evlyn Kanner, NP on 04/06/2015   Forest Gleason, MD   12/08/2015 1:53 PM

## 2015-12-15 ENCOUNTER — Telehealth: Payer: Self-pay | Admitting: *Deleted

## 2015-12-15 DIAGNOSIS — C50919 Malignant neoplasm of unspecified site of unspecified female breast: Secondary | ICD-10-CM

## 2015-12-15 NOTE — Telephone Encounter (Signed)
Pt called and states has nasal congestion. Instructed pt to get OTC cold and sinus and to call us back if develops fever or symptoms worsen. Pt and spouse verbalized understanding.

## 2015-12-15 NOTE — Telephone Encounter (Signed)
Becky at [facility] left vm on Dr. Darlin Priestly clinical hotline. Msg retreived at [phone]. This is a [facility] patient.  [facility] states that patient was due to restart Ibrance back today.  The patient was technically scheduled to start back today but [facility] needs another RX faxed asap.  Could we have Leslie/Hayley send RX to [facility]. Thanks, Nira Conn, RN

## 2015-12-16 MED ORDER — PALBOCICLIB 125 MG PO CAPS: 125.0000 mg | ORAL_CAPSULE | Freq: Every day | ORAL | Status: DC

## 2015-12-16 NOTE — Addendum Note (Signed)
Addended by: Telford Nab on: 12/16/2015 08:07 AM   Modules accepted: Orders, Medications

## 2015-12-16 NOTE — Telephone Encounter (Signed)
rx for ibrance escribed to [facility].

## 2015-12-23 ENCOUNTER — Other Ambulatory Visit: Payer: Self-pay | Admitting: Oncology

## 2015-12-27 ENCOUNTER — Other Ambulatory Visit: Payer: Self-pay | Admitting: *Deleted

## 2015-12-27 NOTE — Telephone Encounter (Signed)
Entered in error

## 2015-12-27 NOTE — Telephone Encounter (Signed)
Last Mg+ level 2.0 a month ago

## 2015-12-28 NOTE — Addendum Note (Signed)
Addended by: Betti Cruz on: 12/28/2015 08:29 AM   Modules accepted: Orders, Medications

## 2015-12-29 ENCOUNTER — Inpatient Hospital Stay: Payer: Medicare Other

## 2015-12-29 ENCOUNTER — Inpatient Hospital Stay (HOSPITAL_BASED_OUTPATIENT_CLINIC_OR_DEPARTMENT_OTHER): Payer: Medicare Other | Admitting: Oncology

## 2015-12-29 ENCOUNTER — Encounter: Payer: Self-pay | Admitting: Oncology

## 2015-12-29 ENCOUNTER — Inpatient Hospital Stay: Payer: Medicare Other | Attending: Oncology

## 2015-12-29 VITALS — BP 163/92 | HR 89 | Temp 96.8°F | Resp 18 | Wt 140.7 lb

## 2015-12-29 DIAGNOSIS — C50412 Malignant neoplasm of upper-outer quadrant of left female breast: Secondary | ICD-10-CM | POA: Insufficient documentation

## 2015-12-29 DIAGNOSIS — C7951 Secondary malignant neoplasm of bone: Secondary | ICD-10-CM | POA: Insufficient documentation

## 2015-12-29 DIAGNOSIS — R531 Weakness: Secondary | ICD-10-CM | POA: Diagnosis not present

## 2015-12-29 DIAGNOSIS — Z923 Personal history of irradiation: Secondary | ICD-10-CM | POA: Insufficient documentation

## 2015-12-29 DIAGNOSIS — Z7982 Long term (current) use of aspirin: Secondary | ICD-10-CM | POA: Insufficient documentation

## 2015-12-29 DIAGNOSIS — C50612 Malignant neoplasm of axillary tail of left female breast: Secondary | ICD-10-CM

## 2015-12-29 DIAGNOSIS — E785 Hyperlipidemia, unspecified: Secondary | ICD-10-CM

## 2015-12-29 DIAGNOSIS — R5383 Other fatigue: Secondary | ICD-10-CM | POA: Insufficient documentation

## 2015-12-29 DIAGNOSIS — Z79811 Long term (current) use of aromatase inhibitors: Secondary | ICD-10-CM | POA: Insufficient documentation

## 2015-12-29 DIAGNOSIS — Z170 Estrogen receptor positive status [ER+]: Secondary | ICD-10-CM | POA: Insufficient documentation

## 2015-12-29 DIAGNOSIS — I10 Essential (primary) hypertension: Secondary | ICD-10-CM

## 2015-12-29 DIAGNOSIS — Z79899 Other long term (current) drug therapy: Secondary | ICD-10-CM

## 2015-12-29 DIAGNOSIS — Z5112 Encounter for antineoplastic immunotherapy: Secondary | ICD-10-CM | POA: Insufficient documentation

## 2015-12-29 DIAGNOSIS — Z7984 Long term (current) use of oral hypoglycemic drugs: Secondary | ICD-10-CM | POA: Diagnosis not present

## 2015-12-29 DIAGNOSIS — C773 Secondary and unspecified malignant neoplasm of axilla and upper limb lymph nodes: Secondary | ICD-10-CM | POA: Insufficient documentation

## 2015-12-29 DIAGNOSIS — C50619 Malignant neoplasm of axillary tail of unspecified female breast: Secondary | ICD-10-CM

## 2015-12-29 DIAGNOSIS — C50919 Malignant neoplasm of unspecified site of unspecified female breast: Secondary | ICD-10-CM

## 2015-12-29 LAB — CBC WITH DIFFERENTIAL/PLATELET
BASOS ABS: 0 10*3/uL (ref 0–0.1)
Basophils Relative: 1 %
EOS PCT: 5 %
Eosinophils Absolute: 0.4 10*3/uL (ref 0–0.7)
HEMATOCRIT: 38 % (ref 35.0–47.0)
Hemoglobin: 12.7 g/dL (ref 12.0–16.0)
LYMPHS ABS: 1.7 10*3/uL (ref 1.0–3.6)
Lymphocytes Relative: 22 %
MCH: 30.8 pg (ref 26.0–34.0)
MCHC: 33.4 g/dL (ref 32.0–36.0)
MCV: 92.3 fL (ref 80.0–100.0)
MONO ABS: 0.6 10*3/uL (ref 0.2–0.9)
Monocytes Relative: 7 %
NEUTROS PCT: 65 %
Neutro Abs: 5 10*3/uL (ref 1.4–6.5)
PLATELETS: 174 10*3/uL (ref 150–440)
RBC: 4.12 MIL/uL (ref 3.80–5.20)
RDW: 14.2 % (ref 11.5–14.5)
WBC: 7.6 10*3/uL (ref 3.6–11.0)

## 2015-12-29 LAB — COMPREHENSIVE METABOLIC PANEL
ALT: 30 U/L (ref 14–54)
AST: 28 U/L (ref 15–41)
Albumin: 4.1 g/dL (ref 3.5–5.0)
Alkaline Phosphatase: 76 U/L (ref 38–126)
Anion gap: 6 (ref 5–15)
BILIRUBIN TOTAL: 0.7 mg/dL (ref 0.3–1.2)
BUN: 10 mg/dL (ref 6–20)
CO2: 29 mmol/L (ref 22–32)
CREATININE: 0.51 mg/dL (ref 0.44–1.00)
Calcium: 9.2 mg/dL (ref 8.9–10.3)
Chloride: 100 mmol/L — ABNORMAL LOW (ref 101–111)
Glucose, Bld: 178 mg/dL — ABNORMAL HIGH (ref 65–99)
Potassium: 3.6 mmol/L (ref 3.5–5.1)
Sodium: 135 mmol/L (ref 135–145)
TOTAL PROTEIN: 7.6 g/dL (ref 6.5–8.1)

## 2015-12-29 LAB — MAGNESIUM: MAGNESIUM: 2 mg/dL (ref 1.7–2.4)

## 2015-12-29 MED ORDER — ACETAMINOPHEN 325 MG PO TABS
650.0000 mg | ORAL_TABLET | Freq: Once | ORAL | Status: AC
  Administered 2015-12-29: 650 mg via ORAL
  Filled 2015-12-29: qty 2

## 2015-12-29 MED ORDER — HEPARIN SOD (PORK) LOCK FLUSH 100 UNIT/ML IV SOLN
500.0000 [IU] | Freq: Once | INTRAVENOUS | Status: AC | PRN
  Administered 2015-12-29: 500 [IU]
  Filled 2015-12-29: qty 5

## 2015-12-29 MED ORDER — DIPHENHYDRAMINE HCL 25 MG PO CAPS
50.0000 mg | ORAL_CAPSULE | Freq: Once | ORAL | Status: AC
  Administered 2015-12-29: 50 mg via ORAL
  Filled 2015-12-29: qty 2

## 2015-12-29 MED ORDER — TRASTUZUMAB CHEMO INJECTION 440 MG
6.0000 mg/kg | Freq: Once | INTRAVENOUS | Status: AC
  Administered 2015-12-29: 399 mg via INTRAVENOUS
  Filled 2015-12-29: qty 19

## 2015-12-29 MED ORDER — SODIUM CHLORIDE 0.9 % IV SOLN
Freq: Once | INTRAVENOUS | Status: AC
  Administered 2015-12-29: 15:00:00 via INTRAVENOUS
  Filled 2015-12-29: qty 1000

## 2015-12-29 MED ORDER — DENOSUMAB 120 MG/1.7ML ~~LOC~~ SOLN
120.0000 mg | Freq: Once | SUBCUTANEOUS | Status: AC
  Administered 2015-12-29: 120 mg via SUBCUTANEOUS
  Filled 2015-12-29: qty 1.7

## 2015-12-29 MED ORDER — SODIUM CHLORIDE 0.9 % IJ SOLN
10.0000 mL | Freq: Once | INTRAMUSCULAR | Status: AC
  Administered 2015-12-29: 10 mL via INTRAVENOUS
  Filled 2015-12-29: qty 10

## 2015-12-29 MED ORDER — CALCIUM 600-200 MG-UNIT PO TABS: 1.0000 | ORAL_TABLET | Freq: Two times a day (BID) | ORAL | Status: AC

## 2015-12-29 NOTE — Progress Notes (Signed)
rx for ibrance escribed to [facility].  [place] @ [facility] Telephone:(336) [phone]  Fax:(336) [place]: 1946/02/20  MR#: [id number]  SWF#:[id number]  Patient Care Team: Kathrine Haddock, NP as PCP - General (Nurse Practitioner) Robert Bellow, MD (General Surgery)  CHIEF COMPLAINT:  Chief Complaint  Patient presents with  . 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  Estrogen receptor 90%.  Progesterone receptor 60%.  Patient had lumpectomy and MammoSite partial breast radiation therapy followed by tamoxifen for 5 years.  Patient is still taking tamoxifen.. 2 recent mammogram in December of 2015 negative.  Further evaluation by surgeon revealed a palpable mass in the left upper outer quadrant near axilla.  Ultrasound revealed one small normal-appearing lymph node 0.6 cm in lower limit of axilla.  Adjacent to this there was ill-defined multilobulated hypoechoic mass 1.8 x 2.6 cm.  Biopsy of the left axillary mass was done which was positive for high-grade carcinoma.hormone receptors are pending  2,estrogen receptor positive.  Progesterone receptor positive.  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 3.  MRI scan of breast (January, 2015) reveals multiple abnormal enlarged left axillary lymph node There are 2 oval some circumscribed mass at 2:30 and 3:00 position of the left breast.  0.6 cm x 1.3 cm and 4 x 7 mm Non-mass enhancement extending 2.4 cm these masses is 3.6 cm mass.  Findings suggestive of several pulmonary nodules.  PET  scan shows extensive disease, 4.started on Taxotere,perjeta, Herceptin(January 03, 2015). 5.  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  Patient is excellent response to Taxotere per time and Herceptin now on Herceptin and anti-hormonal maintenance therapy No bony pain. Appetite is improved.  No nausea.  No vomiting.  No diarrhea. No shortness of breath or chest pain Patient had a repeat MUGA scan of the heart.  Here for further follow-up and continuation of treatment.  No chills.  No fever.  Getting extremely well for bone metastases. Patient is here for further follow-up regarding stage IV carcinoma of breast. Patient received XGEVA on December 1 No chills no fever no nausea no vomiting no diarrhea. Taking  BRANCE and letrozole Patient is here for ongoing evaluation and continuation of treatment.  MUGA scan of the heart was stable    REVIEW OF SYSTEMS:    general status: Patient is feeling weak and tired.  No change in a performance status.  No chills.  No fever. HEENT:   No evidence of stomatitis Lungs: No cough or shortness of breath Cardiac: No chest pain or paroxysmal nocturnal dyspnea GI: No nausea no vomiting no diarrhea no abdominal pain Skin: No rash Lower extremity no swelling Neurological system: No tingling.  No numbness.  No other focal signs Musculoskeletal system no bony pains  As per HPI. Otherwise, a complete review of systems is negatve.  PAST MEDICAL HISTORY: Past Medical History  Diagnosis Date  . Hypertension 2009  . 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 Special screening for malignant neoplasms, colon   . Hyperlipidemia 1990  . Floaters 2014  . Cancer [facility]) 2010    right breast  ductal carcinoma in situ  . 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 ER 90%, PR 50%, MammoSite partial breast radiation  . Breast cancer ([place]) 02/21/2009  . Malignant neoplasm of axillary tail of female breast ([place]) 12/16/2014  . Axillary mass 12/15/2014     PAST SURGICAL HISTORY: Past Surgical History  Procedure Laterality Date  . Appendectomy  1956  . Partial hysterectomy  1977  . Colonoscopy  2008    ??? Md  . Breast surgery Right 2010    wide excision  . Breast biopsy Right 2010    stereo  . Left axilla biopsy Left 12-14-14    FAMILY HISTORY Family History  Problem Relation Age of Onset  . Cancer Other     unknown family members with ovarian,colon,breast cancers  . Hypertension Mother   . Heart disease Daughter     MI    ADVANCED DIRECTIVES:  Patient does have advance healthcare directive, Patient   does not desire to make any changes HEALTH MAINTENANCE: Social History  Substance Use Topics  . Smoking status: Never Smoker   . Smokeless tobacco: Never Used  . Alcohol Use: No      Allergies  Allergen Reactions  . Penicillin G     Other reaction(s): Pruritic rash  . Sulfa Antibiotics Rash    Other reaction(s): Weal    Current Outpatient Prescriptions  Medication Sig Dispense Refill  . ALPRAZolam (XANAX) 0.5 MG tablet TAKE 1 TABLET BY MOUTH 3 TIMES A DAY AS NEEDED 90 tablet 0  . aspirin 81 MG tablet Take 81 mg by mouth daily.    . CRESTOR 5 MG tablet Take 5 mg by mouth daily at 6 PM.     . KLOR-CON M20 20 MEQ tablet TAKE 2 TABLETS BY MOUTH EVERY DAY 60 tablet 0  . letrozole (FEMARA) 2.5 MG tablet Take 1 tablet (2.5 mg total) by mouth daily.    . metFORMIN (GLUCOPHAGE) 500 MG tablet Take 500 mg by mouth 2 (two) times daily with a meal.     . ondansetron (ZOFRAN) 4 MG tablet Take 4 mg by mouth every 8 (eight) hours as needed for nausea or vomiting.    . ONE TOUCH ULTRA TEST test strip     . palbociclib (IBRANCE) 125 MG capsule Take 1 capsule (125 mg total) by mouth daily with breakfast. Take whole with food for 21 days then 7 days off. 21 capsule 3  . valsartan-hydrochlorothiazide (DIOVAN-HCT) 320-25 MG per tablet Take 1 tablet by mouth daily.     . Calcium 600-200 MG-UNIT tablet Take 1 tablet by mouth 2 (two)  times daily. 120 tablet 3   No current facility-administered medications for this visit.   Facility-Administered Medications Ordered in Other Visits  Medication Dose Route Frequency Provider Last Rate Last Dose  . heparin lock flush 100 unit/mL  250 Units Intracatheter PRN Forest Gleason, MD      . sodium chloride 0.9 % 250 mL with potassium chloride 40 mEq infusion   Intravenous Continuous Forest Gleason, MD   Stopped at 05/03/15 1229  . sodium chloride 0.9 % injection 10 mL  10 mL Intracatheter PRN Forest Gleason, MD   10 mL at 04/18/15 1545    OBJECTIVE:  Filed Vitals:   12/29/15 1352  BP: 163/92  Pulse: 89  Temp: 96.8 F (36 C)  Resp: 18     Body  mass index is 26.59 kg/(m^2).    ECOG FS:1 - Symptomatic but completely ambulatory  PHYSICAL EXAM: gENERAL  status: Performance status is good.  Patient has not lost significant weight HEENT: No evidence of stomatitis. Sclera and conjunctivae :: No jaundice.   pale looking. Lungs: Air  entry equal on both sides.  No rhonchi.  No rales.  Cardiac: Heart sounds are normal.  No pericardial rub.  No murmur. Small subcutaneous nodule on the right side of the throat (chronic) Lymphatic system: Cervical, axillary, inguinal, lymph nodes not palpable GI: Abdomen is soft.  No ascites.  Liver spleen not palpable.  No tenderness.  Bowel sounds are within normal limit Lower extremity: No edema Neurological system: Higher functions, cranial nerves intact no evidence of peripheral neuropathy. Skin: No rash.  No ecchymosis.. Right breast small palpable mass in the outer lower quadrant unchanged.  Right axillary area no palpable mass.    LEFT BREAST .  FREE  of masses LAB RESULTS:  Infusion on 12/29/2015  Component Date Value Ref Range Status  . WBC 12/29/2015 7.6  3.6 - 11.0 K/uL Final  . RBC 12/29/2015 4.12  3.80 - 5.20 MIL/uL Final  . Hemoglobin 12/29/2015 12.7  12.0 - 16.0 g/dL Final  . HCT 12/29/2015 38.0  35.0 - 47.0 % Final  . MCV 12/29/2015 92.3   80.0 - 100.0 fL Final  . MCH 12/29/2015 30.8  26.0 - 34.0 pg Final  . MCHC 12/29/2015 33.4  32.0 - 36.0 g/dL Final  . RDW 12/29/2015 14.2  11.5 - 14.5 % Final  . Platelets 12/29/2015 174  150 - 440 K/uL Final  . Neutrophils Relative % 12/29/2015 65   Final  . Neutro Abs 12/29/2015 5.0  1.4 - 6.5 K/uL Final  . Lymphocytes Relative 12/29/2015 22   Final  . Lymphs Abs 12/29/2015 1.7  1.0 - 3.6 K/uL Final  . Monocytes Relative 12/29/2015 7   Final  . Monocytes Absolute 12/29/2015 0.6  0.2 - 0.9 K/uL Final  . Eosinophils Relative 12/29/2015 5   Final  . Eosinophils Absolute 12/29/2015 0.4  0 - 0.7 K/uL Final  . Basophils Relative 12/29/2015 1   Final  . Basophils Absolute 12/29/2015 0.0  0 - 0.1 K/uL Final  . Sodium 12/29/2015 135  135 - 145 mmol/L Final  . Potassium 12/29/2015 3.6  3.5 - 5.1 mmol/L Final  . Chloride 12/29/2015 100* 101 - 111 mmol/L Final  . CO2 12/29/2015 29  22 - 32 mmol/L Final  . Glucose, Bld 12/29/2015 178* 65 - 99 mg/dL Final  . BUN 12/29/2015 10  6 - 20 mg/dL Final  . Creatinine, Ser 12/29/2015 0.51  0.44 - 1.00 mg/dL Final  . Calcium 12/29/2015 9.2  8.9 - 10.3 mg/dL Final  . Total Protein 12/29/2015 7.6  6.5 - 8.1 g/dL Final  . Albumin 12/29/2015 4.1  3.5 - 5.0 g/dL Final  . AST 12/29/2015 28  15 - 41 U/L Final  . ALT 12/29/2015 30  14 - 54 U/L Final  . Alkaline Phosphatase 12/29/2015 76  38 - 126 U/L Final  . Total Bilirubin 12/29/2015 0.7  0.3 - 1.2 mg/dL Final  . GFR calc non Af Amer 12/29/2015 >60  >60 mL/min Final  . GFR calc Af Amer 12/29/2015 >60  >60 mL/min Final   Comment: (NOTE) The eGFR has been calculated using the CKD EPI equation. This calculation has not been validated in all clinical situations. eGFR's persistently <60 mL/min signify possible Chronic Kidney  Disease.   . Anion gap 12/29/2015 6  5 - 15 Final  . Magnesium 12/29/2015 2.0  1.7 - 2.4 mg/dL Final   Last MUGA scan was in November of 2016. Normal left ventricular ejection fraction.   58% slightly decreased from the previous 63%.  Electronically Signed  By: Kerby Moors M.D.  On: 07/12/2015 12:02 Component     Latest Ref Rng 12/16/2014 04/25/2015 06/06/2015 07/18/2015 08/15/2015  CA 27.29     0.0 - 38.6 U/mL 35.9 66.2 (H) 47.4 (H) 51.7 (H) 70.4 (H)         ASSESSMENT: STAGE 4  carcinoma of breast good response to chemotherapy on maintenance Herceptin therapy Started on letrozole and  IBRANCE  Tumor markers are slightly increased but last month was stable. No  significant side effect from [facility] or Herceptin.   MEDICAL DECISION MAKING:   All lab data has been reviewed.  Reassessment of the tumor with tumor marker as well as with PET scan prior to next treatment.  MUGA scan the heart has been reviewed independently No evidence of myelosuppression Continue IBRANCE letrozole and Herceptin  By tumor markers criteria tumor is stable.  Will repeat CA-27-29 and if needed PET scan by January.      Breast cancer   Staging form: Breast, AJCC 7th Edition     Clinical stage from 04/06/2015: Stage IV (T2, N2, M1) - Signed by Evlyn Kanner, NP on 04/06/2015   Forest Gleason, MD   12/29/2015 9:31 PM

## 2015-12-30 LAB — CANCER ANTIGEN 27.29: CA 27.29: 58 U/mL — AB (ref 0.0–38.6)

## 2016-01-09 ENCOUNTER — Telehealth: Payer: Self-pay | Admitting: *Deleted

## 2016-01-09 DIAGNOSIS — C50919 Malignant neoplasm of unspecified site of unspecified female breast: Secondary | ICD-10-CM

## 2016-01-09 MED ORDER — LETROZOLE 2.5 MG PO TABS: 2.5000 mg | ORAL_TABLET | Freq: Every day | ORAL | Status: DC

## 2016-01-09 NOTE — Telephone Encounter (Signed)
Escribed

## 2016-01-09 NOTE — Telephone Encounter (Signed)
Patient notified refill sent to pharmacy.  

## 2016-01-11 ENCOUNTER — Telehealth: Payer: Self-pay | Admitting: *Deleted

## 2016-01-11 NOTE — Telephone Encounter (Signed)
Patient called [facility] questioning if application sent to manufacturer for [employer] was approved to get assistance. Called Deanna back and left voicemail that our office was not aware of patient needing assistance through manufacturer. Asked Deanna if patient needs assistance and we will be glad to complete the application and send to [place]. Awaiting callback.

## 2016-01-12 ENCOUNTER — Telehealth: Payer: Self-pay | Admitting: Unknown Physician Specialty

## 2016-01-12 ENCOUNTER — Other Ambulatory Visit: Payer: Self-pay | Admitting: Unknown Physician Specialty

## 2016-01-12 ENCOUNTER — Other Ambulatory Visit: Payer: Self-pay | Admitting: Oncology

## 2016-01-12 MED ORDER — ROSUVASTATIN CALCIUM 5 MG PO TABS: 5.0000 mg | ORAL_TABLET | Freq: Every day | ORAL | Status: DC

## 2016-01-12 NOTE — Telephone Encounter (Signed)
Pharmacy would like to have generic crestor sent in because crestor was $133 for a 30 day supply

## 2016-01-12 NOTE — Telephone Encounter (Signed)
Routing to provider. Do they mean rosuvastatin?

## 2016-01-16 ENCOUNTER — Ambulatory Visit
Admission: RE | Admit: 2016-01-16 | Discharge: 2016-01-16 | Disposition: A | Discharge status: Home / Self Care | Payer: Medicare Other | Source: Ambulatory Visit | Attending: Oncology | Admitting: Oncology

## 2016-01-16 DIAGNOSIS — C50919 Malignant neoplasm of unspecified site of unspecified female breast: Secondary | ICD-10-CM

## 2016-01-16 DIAGNOSIS — R918 Other nonspecific abnormal finding of lung field: Secondary | ICD-10-CM | POA: Insufficient documentation

## 2016-01-16 DIAGNOSIS — I2510 Atherosclerotic heart disease of native coronary artery without angina pectoris: Secondary | ICD-10-CM | POA: Insufficient documentation

## 2016-01-16 DIAGNOSIS — M899 Disorder of bone, unspecified: Secondary | ICD-10-CM | POA: Diagnosis not present

## 2016-01-16 LAB — GLUCOSE, CAPILLARY: Glucose-Capillary: 134 mg/dL — ABNORMAL HIGH (ref 65–99)

## 2016-01-16 MED ORDER — FLUDEOXYGLUCOSE F - 18 (FDG) INJECTION
12.1700 | Freq: Once | INTRAVENOUS | Status: AC | PRN
  Administered 2016-01-16: 12.17 via INTRAVENOUS

## 2016-01-18 ENCOUNTER — Telehealth: Payer: Self-pay | Admitting: *Deleted

## 2016-01-18 NOTE — Telephone Encounter (Signed)
Called patient and left message for patient to bring proof of income (W2 or SS information) when she comes to next appointment.

## 2016-01-19 ENCOUNTER — Inpatient Hospital Stay: Payer: Medicare Other | Attending: Oncology

## 2016-01-19 ENCOUNTER — Inpatient Hospital Stay (HOSPITAL_BASED_OUTPATIENT_CLINIC_OR_DEPARTMENT_OTHER): Payer: Medicare Other | Admitting: Oncology

## 2016-01-19 ENCOUNTER — Encounter: Payer: Self-pay | Admitting: Oncology

## 2016-01-19 ENCOUNTER — Inpatient Hospital Stay: Payer: Medicare Other

## 2016-01-19 VITALS — BP 178/84 | HR 82 | Temp 95.9°F | Resp 18 | Wt 137.8 lb

## 2016-01-19 DIAGNOSIS — C773 Secondary and unspecified malignant neoplasm of axilla and upper limb lymph nodes: Secondary | ICD-10-CM

## 2016-01-19 DIAGNOSIS — E785 Hyperlipidemia, unspecified: Secondary | ICD-10-CM | POA: Diagnosis not present

## 2016-01-19 DIAGNOSIS — Z5112 Encounter for antineoplastic immunotherapy: Secondary | ICD-10-CM | POA: Diagnosis not present

## 2016-01-19 DIAGNOSIS — Z7984 Long term (current) use of oral hypoglycemic drugs: Secondary | ICD-10-CM | POA: Diagnosis not present

## 2016-01-19 DIAGNOSIS — C50612 Malignant neoplasm of axillary tail of left female breast: Secondary | ICD-10-CM | POA: Diagnosis not present

## 2016-01-19 DIAGNOSIS — Z170 Estrogen receptor positive status [ER+]: Secondary | ICD-10-CM | POA: Insufficient documentation

## 2016-01-19 DIAGNOSIS — R5383 Other fatigue: Secondary | ICD-10-CM

## 2016-01-19 DIAGNOSIS — C7951 Secondary malignant neoplasm of bone: Secondary | ICD-10-CM

## 2016-01-19 DIAGNOSIS — I10 Essential (primary) hypertension: Secondary | ICD-10-CM | POA: Insufficient documentation

## 2016-01-19 DIAGNOSIS — Z7982 Long term (current) use of aspirin: Secondary | ICD-10-CM | POA: Insufficient documentation

## 2016-01-19 DIAGNOSIS — C50619 Malignant neoplasm of axillary tail of unspecified female breast: Secondary | ICD-10-CM

## 2016-01-19 DIAGNOSIS — Z853 Personal history of malignant neoplasm of breast: Secondary | ICD-10-CM | POA: Diagnosis not present

## 2016-01-19 DIAGNOSIS — Z923 Personal history of irradiation: Secondary | ICD-10-CM | POA: Diagnosis not present

## 2016-01-19 DIAGNOSIS — R918 Other nonspecific abnormal finding of lung field: Secondary | ICD-10-CM

## 2016-01-19 DIAGNOSIS — Z809 Family history of malignant neoplasm, unspecified: Secondary | ICD-10-CM | POA: Diagnosis not present

## 2016-01-19 DIAGNOSIS — R531 Weakness: Secondary | ICD-10-CM

## 2016-01-19 DIAGNOSIS — Z79899 Other long term (current) drug therapy: Secondary | ICD-10-CM

## 2016-01-19 DIAGNOSIS — C50919 Malignant neoplasm of unspecified site of unspecified female breast: Secondary | ICD-10-CM

## 2016-01-19 DIAGNOSIS — Z79811 Long term (current) use of aromatase inhibitors: Secondary | ICD-10-CM

## 2016-01-19 LAB — COMPREHENSIVE METABOLIC PANEL
ALBUMIN: 4.2 g/dL (ref 3.5–5.0)
ALT: 25 U/L (ref 14–54)
AST: 25 U/L (ref 15–41)
Alkaline Phosphatase: 66 U/L (ref 38–126)
Anion gap: 9 (ref 5–15)
BUN: 12 mg/dL (ref 6–20)
CHLORIDE: 101 mmol/L (ref 101–111)
CO2: 27 mmol/L (ref 22–32)
Calcium: 9.2 mg/dL (ref 8.9–10.3)
Creatinine, Ser: 0.73 mg/dL (ref 0.44–1.00)
GFR calc Af Amer: >60 mL/min (ref >60)
GFR calc non Af Amer: >60 mL/min (ref >60)
GLUCOSE: 134 mg/dL — AB (ref 65–99)
Potassium: 3.7 mmol/L (ref 3.5–5.1)
Sodium: 137 mmol/L (ref 135–145)
Total Bilirubin: 0.8 mg/dL (ref 0.3–1.2)
Total Protein: 7.5 g/dL (ref 6.5–8.1)

## 2016-01-19 LAB — CBC WITH DIFFERENTIAL/PLATELET
BASOS ABS: 0 10*3/uL (ref 0–0.1)
BASOS PCT: 1 %
EOS ABS: 0.2 10*3/uL (ref 0–0.7)
EOS PCT: 4 %
HCT: 38.5 % (ref 35.0–47.0)
Hemoglobin: 13 g/dL (ref 12.0–16.0)
Lymphocytes Relative: 25 %
Lymphs Abs: 1.7 10*3/uL (ref 1.0–3.6)
MCH: 30.8 pg (ref 26.0–34.0)
MCHC: 33.7 g/dL (ref 32.0–36.0)
MCV: 91.4 fL (ref 80.0–100.0)
MONO ABS: 0.6 10*3/uL (ref 0.2–0.9)
Monocytes Relative: 8 %
Neutro Abs: 4.4 10*3/uL (ref 1.4–6.5)
Neutrophils Relative %: 62 %
PLATELETS: 143 10*3/uL — AB (ref 150–440)
RBC: 4.21 MIL/uL (ref 3.80–5.20)
RDW: 14.4 % (ref 11.5–14.5)
WBC: 7 10*3/uL (ref 3.6–11.0)

## 2016-01-19 LAB — MAGNESIUM: MAGNESIUM: 1.8 mg/dL (ref 1.7–2.4)

## 2016-01-19 MED ORDER — DIPHENHYDRAMINE HCL 25 MG PO CAPS
50.0000 mg | ORAL_CAPSULE | Freq: Once | ORAL | Status: AC
  Administered 2016-01-19: 25 mg via ORAL
  Filled 2016-01-19: qty 1

## 2016-01-19 MED ORDER — ACETAMINOPHEN 325 MG PO TABS
650.0000 mg | ORAL_TABLET | Freq: Once | ORAL | Status: AC
  Administered 2016-01-19: 650 mg via ORAL
  Filled 2016-01-19: qty 2

## 2016-01-19 MED ORDER — SODIUM CHLORIDE 0.9 % IV SOLN
Freq: Once | INTRAVENOUS | Status: AC
  Administered 2016-01-19: 15:00:00 via INTRAVENOUS
  Filled 2016-01-19: qty 1000

## 2016-01-19 MED ORDER — HEPARIN SOD (PORK) LOCK FLUSH 100 UNIT/ML IV SOLN
500.0000 [IU] | Freq: Once | INTRAVENOUS | Status: AC | PRN
  Administered 2016-01-19: 500 [IU]
  Filled 2016-01-19: qty 5

## 2016-01-19 MED ORDER — TRASTUZUMAB CHEMO INJECTION 440 MG
6.0000 mg/kg | Freq: Once | INTRAVENOUS | Status: AC
  Administered 2016-01-19: 399 mg via INTRAVENOUS
  Filled 2016-01-19: qty 19

## 2016-01-19 NOTE — Progress Notes (Signed)
rx for ibrance escribed to [facility].  [place] @ [facility] Telephone:(336) [phone]  Fax:(336) [place]: 1946/09/15  MR#: [id number]  MGN#:[id number]  Patient Care Team: Kathrine Haddock, NP as PCP - General (Nurse Practitioner) Robert Bellow, MD (General Surgery)  CHIEF COMPLAINT:  Chief Complaint  Patient presents with  . 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  Estrogen receptor 90%.  Progesterone receptor 60%.  Patient had lumpectomy and MammoSite partial breast radiation therapy followed by tamoxifen for 5 years.  Patient is still taking tamoxifen.. 2 recent mammogram in December of 2015 negative.  Further evaluation by surgeon revealed a palpable mass in the left upper outer quadrant near axilla.  Ultrasound revealed one small normal-appearing lymph node 0.6 cm in lower limit of axilla.  Adjacent to this there was ill-defined multilobulated hypoechoic mass 1.8 x 2.6 cm.  Biopsy of the left axillary mass was done which was positive for high-grade carcinoma.hormone receptors are pending  2,estrogen receptor positive.  Progesterone receptor positive.  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 3.  MRI scan of breast (January, 2015) reveals multiple abnormal enlarged left axillary lymph node There are 2 oval some circumscribed mass at 2:30 and 3:00 position of the left breast.  0.6 cm x 1.3 cm and 4 x 7 mm Non-mass enhancement extending 2.4 cm these masses is 3.6 cm mass.  Findings suggestive of several pulmonary nodules.  PET  scan shows extensive disease, 4.started on Taxotere,perjeta, Herceptin(January 03, 2015). 5.  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  Patient is excellent response to Taxotere per time and Herceptin now on Herceptin and anti-hormonal maintenance therapy No bony pain. Appetite is improved.  No nausea.  No vomiting.  No diarrhea. No shortness of breath or chest pain Patient had a repeat MUGA scan of the heart.  Here for further follow-up and continuation of treatment.  No chills.  No fever.  Getting extremely well for bone metastases. Patient is here for further follow-up regarding stage IV carcinoma of breast. Patient received XGEVA on December 1 No chills no fever no nausea no vomiting no diarrhea. Taking  BRANCE and letrozole Patient is here for ongoing evaluation and continuation of treatment.   MUGA scan was done in November which was stable.  Patient also had a PET scan done which is has been reviewed independently and so stable disease. Here for further evaluation and continuation of therapy    REVIEW OF SYSTEMS:    general status: Patient is feeling weak and tired.  No change in a performance status.  No chills.  No fever. HEENT:   No evidence of stomatitis Lungs: No cough or shortness of breath Cardiac: No chest pain or paroxysmal nocturnal dyspnea GI: No nausea no vomiting no diarrhea no abdominal pain Skin: No rash Lower extremity no swelling Neurological system: No tingling.  No numbness.  No other focal signs Musculoskeletal system no bony pains  As per HPI. Otherwise, a complete review of systems is negatve.  PAST MEDICAL HISTORY: Past Medical History  Diagnosis Date  . Hypertension 2009  . 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 Special screening for malignant neoplasms, colon   . Hyperlipidemia 1990  . Floaters 2014  . Cancer [facility]) 2010    right breast ductal carcinoma in situ  . 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 ER 90%, PR 50%,  MammoSite partial breast radiation  . Breast cancer ([place]) 02/21/2009  . Malignant neoplasm of axillary tail of female breast ([place]) 12/16/2014  . Axillary mass 12/15/2014    PAST SURGICAL HISTORY: Past Surgical History  Procedure Laterality Date  . Appendectomy  1956  . Partial hysterectomy  1977  . Colonoscopy  2008    ??? Md  . Breast surgery Right 2010    wide excision  . Breast biopsy Right 2010    stereo  . Left axilla biopsy Left 12-14-14    FAMILY HISTORY Family History  Problem Relation Age of Onset  . Cancer Other     unknown family members with ovarian,colon,breast cancers  . Hypertension Mother   . Heart disease Daughter     MI    ADVANCED DIRECTIVES:  Patient does have advance healthcare directive, Patient   does not desire to make any changes HEALTH MAINTENANCE: Social History  Substance Use Topics  . Smoking status: Never Smoker   . Smokeless tobacco: Never Used  . Alcohol Use: No      Allergies  Allergen Reactions  . Penicillin G     Other reaction(s): Pruritic rash  . Sulfa Antibiotics Rash    Other reaction(s): Weal    Current Outpatient Prescriptions  Medication Sig Dispense Refill  . ALPRAZolam (XANAX) 0.5 MG tablet TAKE 1 TABLET BY MOUTH 3 TIMES A DAY AS NEEDED 90 tablet 0  . aspirin 81 MG tablet Take 81 mg by mouth daily.    . Calcium 600-200 MG-UNIT tablet Take 1 tablet by mouth 2 (two) times daily. 120 tablet 3  . KLOR-CON M20 20 MEQ tablet TAKE 2 TABLETS BY MOUTH EVERY DAY 60 tablet 0  . letrozole (FEMARA) 2.5 MG tablet Take 1 tablet (2.5 mg total) by mouth daily. 30 tablet 6  . metFORMIN (GLUCOPHAGE) 500 MG tablet Take 500 mg by mouth 2 (two) times daily with a meal.     . ondansetron (ZOFRAN) 4 MG tablet Take 4 mg by mouth every 8 (eight) hours as needed for nausea or vomiting.    . ONE TOUCH ULTRA TEST test strip     . palbociclib (IBRANCE) 125 MG capsule Take 1 capsule (125 mg total) by mouth daily with breakfast. Take whole with  food for 21 days then 7 days off. 21 capsule 3  . rosuvastatin (CRESTOR) 5 MG tablet Take 1 tablet (5 mg total) by mouth daily. 90 tablet 1  . valsartan-hydrochlorothiazide (DIOVAN-HCT) 320-25 MG tablet TAKE 1 TABLET BY MOUTH DAILY 30 tablet 6   No current facility-administered medications for this visit.   Facility-Administered Medications Ordered in Other Visits  Medication Dose Route Frequency Provider Last Rate Last Dose  . heparin lock flush 100 unit/mL  250 Units Intracatheter PRN Forest Gleason, MD      . sodium chloride 0.9 % 250 mL with potassium chloride 40 mEq infusion   Intravenous Continuous Forest Gleason, MD   Stopped at 05/03/15 1229  . sodium chloride 0.9 % injection 10 mL  10 mL Intracatheter PRN Forest Gleason, MD   10 mL at 04/18/15 1545  OBJECTIVE:  Filed Vitals:   01/19/16 1417  BP: 178/84  Pulse: 82  Temp: 95.9 F (35.5 C)  Resp: 18     Body mass index is 26.05 kg/(m^2).    ECOG FS:1 - Symptomatic but completely ambulatory  PHYSICAL EXAM: gENERAL  status: Performance status is good.  Patient has not lost significant weight HEENT: No evidence of stomatitis. Sclera and conjunctivae :: No jaundice.   pale looking. Lungs: Air  entry equal on both sides.  No rhonchi.  No rales.  Cardiac: Heart sounds are normal.  No pericardial rub.  No murmur. Small subcutaneous nodule on the right side of the throat (chronic) Lymphatic system: Palpable lymph node in the axillary area. GI: Abdomen is soft.  No ascites.  Liver spleen not palpable.  No tenderness.  Bowel sounds are within normal limit Lower extremity: No edema Neurological system: Higher functions, cranial nerves intact no evidence of peripheral neuropathy. Skin: No rash.  No ecchymosis.. Right breast small palpable mass in the outer lower quadrant unchanged.  Right axillary area no palpable mass.    LEFT BREAST .  FREE  of masses  LAB RESULTS:  Infusion on 01/19/2016  Component Date Value Ref Range Status  . WBC  01/19/2016 7.0  3.6 - 11.0 K/uL Final  . RBC 01/19/2016 4.21  3.80 - 5.20 MIL/uL Final  . Hemoglobin 01/19/2016 13.0  12.0 - 16.0 g/dL Final  . HCT 01/19/2016 38.5  35.0 - 47.0 % Final  . MCV 01/19/2016 91.4  80.0 - 100.0 fL Final  . MCH 01/19/2016 30.8  26.0 - 34.0 pg Final  . MCHC 01/19/2016 33.7  32.0 - 36.0 g/dL Final  . RDW 01/19/2016 14.4  11.5 - 14.5 % Final  . Platelets 01/19/2016 143* 150 - 440 K/uL Final  . Neutrophils Relative % 01/19/2016 62   Final  . Neutro Abs 01/19/2016 4.4  1.4 - 6.5 K/uL Final  . Lymphocytes Relative 01/19/2016 25   Final  . Lymphs Abs 01/19/2016 1.7  1.0 - 3.6 K/uL Final  . Monocytes Relative 01/19/2016 8   Final  . Monocytes Absolute 01/19/2016 0.6  0.2 - 0.9 K/uL Final  . Eosinophils Relative 01/19/2016 4   Final  . Eosinophils Absolute 01/19/2016 0.2  0 - 0.7 K/uL Final  . Basophils Relative 01/19/2016 1   Final  . Basophils Absolute 01/19/2016 0.0  0 - 0.1 K/uL Final  . Sodium 01/19/2016 137  135 - 145 mmol/L Final  . Potassium 01/19/2016 3.7  3.5 - 5.1 mmol/L Final  . Chloride 01/19/2016 101  101 - 111 mmol/L Final  . CO2 01/19/2016 27  22 - 32 mmol/L Final  . Glucose, Bld 01/19/2016 134* 65 - 99 mg/dL Final  . BUN 01/19/2016 12  6 - 20 mg/dL Final  . Creatinine, Ser 01/19/2016 0.73  0.44 - 1.00 mg/dL Final  . Calcium 01/19/2016 9.2  8.9 - 10.3 mg/dL Final  . Total Protein 01/19/2016 7.5  6.5 - 8.1 g/dL Final  . Albumin 01/19/2016 4.2  3.5 - 5.0 g/dL Final  . AST 01/19/2016 25  15 - 41 U/L Final  . ALT 01/19/2016 25  14 - 54 U/L Final  . Alkaline Phosphatase 01/19/2016 66  38 - 126 U/L Final  . Total Bilirubin 01/19/2016 0.8  0.3 - 1.2 mg/dL Final  . GFR calc non Af Amer 01/19/2016 >60  >60 mL/min Final  . GFR calc Af Amer 01/19/2016 >60  >60 mL/min Final   Comment: (  NOTE) The eGFR has been calculated using the CKD EPI equation. This calculation has not been validated in all clinical situations. eGFR's persistently <60 mL/min signify  possible Chronic Kidney Disease.   . Anion gap 01/19/2016 9  5 - 15 Final  . Magnesium 01/19/2016 1.8  1.7 - 2.4 mg/dL Final   Last MUGA scan was in November of 2016. Normal left ventricular ejection fraction.  58% slightly decreased from the previous 63%.          ASSESSMENT: STAGE 4  carcinoma of breast good response to chemotherapy on maintenance Herceptin therapy Started on letrozole and  IBRANCE  Tumor markers are slightly increased but last month was stable. No  significant side effect from [facility] or Herceptin.   MEDICAL DECISION MAKING:   All lab data has been reviewed.  Reassessment of the tumor with tumor marker as well as with PET scan prior to next treatment.  MUGA scan the heart has been reviewed independently No evidence of myelosuppression Continue IBRANCE letrozole and Herceptin  PET scan has been reviewed independently shows stable disease. Tumor markers being repeated.  Because of change in the insurance patient did not get any IBRANCE at this point in time Continue letrozole Continue Herceptin and XGEVA Repeat MUGA scan of the heart prior to next appointment     Breast cancer   Staging form: Breast, AJCC 7th Edition     Clinical stage from 04/06/2015: Stage IV (T2, N2, M1) - Signed by Evlyn Kanner, NP on 04/06/2015   Forest Gleason, MD   01/19/2016 2:38 PM

## 2016-01-26 ENCOUNTER — Inpatient Hospital Stay: Payer: Medicare Other

## 2016-01-26 DIAGNOSIS — C773 Secondary and unspecified malignant neoplasm of axilla and upper limb lymph nodes: Secondary | ICD-10-CM | POA: Diagnosis not present

## 2016-01-26 DIAGNOSIS — Z5112 Encounter for antineoplastic immunotherapy: Secondary | ICD-10-CM | POA: Diagnosis not present

## 2016-01-26 DIAGNOSIS — Z79811 Long term (current) use of aromatase inhibitors: Secondary | ICD-10-CM | POA: Diagnosis not present

## 2016-01-26 DIAGNOSIS — C7951 Secondary malignant neoplasm of bone: Secondary | ICD-10-CM | POA: Diagnosis not present

## 2016-01-26 DIAGNOSIS — Z170 Estrogen receptor positive status [ER+]: Secondary | ICD-10-CM | POA: Diagnosis not present

## 2016-01-26 DIAGNOSIS — C50612 Malignant neoplasm of axillary tail of left female breast: Secondary | ICD-10-CM | POA: Diagnosis not present

## 2016-01-26 DIAGNOSIS — C50619 Malignant neoplasm of axillary tail of unspecified female breast: Secondary | ICD-10-CM

## 2016-01-26 MED ORDER — DENOSUMAB 120 MG/1.7ML ~~LOC~~ SOLN
120.0000 mg | Freq: Once | SUBCUTANEOUS | Status: AC
  Administered 2016-01-26: 120 mg via SUBCUTANEOUS
  Filled 2016-01-26: qty 1.7

## 2016-02-09 ENCOUNTER — Encounter: Payer: Self-pay | Admitting: Oncology

## 2016-02-09 ENCOUNTER — Inpatient Hospital Stay (HOSPITAL_BASED_OUTPATIENT_CLINIC_OR_DEPARTMENT_OTHER): Payer: Medicare Other | Admitting: Oncology

## 2016-02-09 ENCOUNTER — Telehealth: Payer: Self-pay | Admitting: *Deleted

## 2016-02-09 ENCOUNTER — Inpatient Hospital Stay: Payer: Medicare Other

## 2016-02-09 VITALS — BP 176/93 | HR 89 | Temp 97.7°F | Resp 18 | Wt 140.2 lb

## 2016-02-09 DIAGNOSIS — C773 Secondary and unspecified malignant neoplasm of axilla and upper limb lymph nodes: Secondary | ICD-10-CM | POA: Diagnosis not present

## 2016-02-09 DIAGNOSIS — C50619 Malignant neoplasm of axillary tail of unspecified female breast: Secondary | ICD-10-CM

## 2016-02-09 DIAGNOSIS — I10 Essential (primary) hypertension: Secondary | ICD-10-CM

## 2016-02-09 DIAGNOSIS — C50912 Malignant neoplasm of unspecified site of left female breast: Secondary | ICD-10-CM | POA: Diagnosis not present

## 2016-02-09 DIAGNOSIS — R918 Other nonspecific abnormal finding of lung field: Secondary | ICD-10-CM

## 2016-02-09 DIAGNOSIS — Z170 Estrogen receptor positive status [ER+]: Secondary | ICD-10-CM | POA: Diagnosis not present

## 2016-02-09 DIAGNOSIS — Z853 Personal history of malignant neoplasm of breast: Secondary | ICD-10-CM

## 2016-02-09 DIAGNOSIS — Z5112 Encounter for antineoplastic immunotherapy: Secondary | ICD-10-CM | POA: Diagnosis not present

## 2016-02-09 DIAGNOSIS — Z923 Personal history of irradiation: Secondary | ICD-10-CM

## 2016-02-09 DIAGNOSIS — Z7982 Long term (current) use of aspirin: Secondary | ICD-10-CM

## 2016-02-09 DIAGNOSIS — C50919 Malignant neoplasm of unspecified site of unspecified female breast: Secondary | ICD-10-CM

## 2016-02-09 DIAGNOSIS — R531 Weakness: Secondary | ICD-10-CM

## 2016-02-09 DIAGNOSIS — Z809 Family history of malignant neoplasm, unspecified: Secondary | ICD-10-CM

## 2016-02-09 DIAGNOSIS — Z79811 Long term (current) use of aromatase inhibitors: Secondary | ICD-10-CM | POA: Diagnosis not present

## 2016-02-09 DIAGNOSIS — C7951 Secondary malignant neoplasm of bone: Secondary | ICD-10-CM | POA: Diagnosis not present

## 2016-02-09 DIAGNOSIS — Z7984 Long term (current) use of oral hypoglycemic drugs: Secondary | ICD-10-CM

## 2016-02-09 DIAGNOSIS — R5383 Other fatigue: Secondary | ICD-10-CM

## 2016-02-09 DIAGNOSIS — E785 Hyperlipidemia, unspecified: Secondary | ICD-10-CM

## 2016-02-09 DIAGNOSIS — Z79899 Other long term (current) drug therapy: Secondary | ICD-10-CM

## 2016-02-09 DIAGNOSIS — C50612 Malignant neoplasm of axillary tail of left female breast: Secondary | ICD-10-CM | POA: Diagnosis not present

## 2016-02-09 LAB — COMPREHENSIVE METABOLIC PANEL
ALT: 30 U/L (ref 14–54)
ANION GAP: 8 (ref 5–15)
AST: 28 U/L (ref 15–41)
Albumin: 4.3 g/dL (ref 3.5–5.0)
Alkaline Phosphatase: 64 U/L (ref 38–126)
BUN: 11 mg/dL (ref 6–20)
CHLORIDE: 102 mmol/L (ref 101–111)
CO2: 29 mmol/L (ref 22–32)
CREATININE: 0.63 mg/dL (ref 0.44–1.00)
Calcium: 9.3 mg/dL (ref 8.9–10.3)
Glucose, Bld: 140 mg/dL — ABNORMAL HIGH (ref 65–99)
POTASSIUM: 3.8 mmol/L (ref 3.5–5.1)
SODIUM: 139 mmol/L (ref 135–145)
Total Bilirubin: 0.8 mg/dL (ref 0.3–1.2)
Total Protein: 7.8 g/dL (ref 6.5–8.1)

## 2016-02-09 LAB — CBC WITH DIFFERENTIAL/PLATELET
BASOS ABS: 0.1 10*3/uL (ref 0–0.1)
Basophils Relative: 1 %
Eosinophils Absolute: 0.3 10*3/uL (ref 0–0.7)
Eosinophils Relative: 4 %
HCT: 39.1 % (ref 35.0–47.0)
Hemoglobin: 13.1 g/dL (ref 12.0–16.0)
LYMPHS PCT: 27 %
Lymphs Abs: 2.2 10*3/uL (ref 1.0–3.6)
MCH: 30.4 pg (ref 26.0–34.0)
MCHC: 33.5 g/dL (ref 32.0–36.0)
MCV: 90.8 fL (ref 80.0–100.0)
Monocytes Absolute: 0.5 10*3/uL (ref 0.2–0.9)
Monocytes Relative: 7 %
NEUTROS ABS: 5 10*3/uL (ref 1.4–6.5)
Neutrophils Relative %: 63 %
PLATELETS: 156 10*3/uL (ref 150–440)
RBC: 4.31 MIL/uL (ref 3.80–5.20)
RDW: 14.2 % (ref 11.5–14.5)
WBC: 8 10*3/uL (ref 3.6–11.0)

## 2016-02-09 MED ORDER — TRASTUZUMAB CHEMO INJECTION 440 MG
6.0000 mg/kg | Freq: Once | INTRAVENOUS | Status: AC
  Administered 2016-02-09: 399 mg via INTRAVENOUS
  Filled 2016-02-09: qty 19

## 2016-02-09 MED ORDER — ACETAMINOPHEN 325 MG PO TABS
650.0000 mg | ORAL_TABLET | Freq: Once | ORAL | Status: AC
  Administered 2016-02-09: 650 mg via ORAL
  Filled 2016-02-09: qty 2

## 2016-02-09 MED ORDER — HEPARIN SOD (PORK) LOCK FLUSH 100 UNIT/ML IV SOLN
500.0000 [IU] | Freq: Once | INTRAVENOUS | Status: AC | PRN
  Administered 2016-02-09: 500 [IU]
  Filled 2016-02-09: qty 5

## 2016-02-09 MED ORDER — DIPHENHYDRAMINE HCL 25 MG PO CAPS
50.0000 mg | ORAL_CAPSULE | Freq: Once | ORAL | Status: AC
  Administered 2016-02-09: 50 mg via ORAL
  Filled 2016-02-09: qty 2

## 2016-02-09 MED ORDER — LETROZOLE 2.5 MG PO TABS: 2.5000 mg | ORAL_TABLET | Freq: Every day | ORAL | Status: DC

## 2016-02-09 MED ORDER — SODIUM CHLORIDE 0.9 % IV SOLN
Freq: Once | INTRAVENOUS | Status: AC
  Administered 2016-02-09: 14:00:00 via INTRAVENOUS
  Filled 2016-02-09: qty 1000

## 2016-02-09 MED ORDER — PALBOCICLIB 125 MG PO CAPS: 125.0000 mg | ORAL_CAPSULE | Freq: Every day | ORAL | Status: DC

## 2016-02-09 NOTE — Telephone Encounter (Signed)
Both prescriptions were escribed to [facility] pharmacy.

## 2016-02-09 NOTE — Progress Notes (Signed)
rx for ibrance escribed to [facility].  [place] @ [facility] [phone]  Fax:([phone] [place]: October 18, 1946  MR#: [id number]  GLO#:[id number]  Patient Care Team: Kathrine Haddock, NP as PCP - General (Nurse Practitioner) Robert Bellow, MD (General Surgery)  CHIEF COMPLAINT:  Chief Complaint  Patient presents with  . 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  Estrogen receptor 90%.  Progesterone receptor 60%.  Patient had lumpectomy and MammoSite partial breast radiation therapy followed by tamoxifen for 5 years.  Patient is still taking tamoxifen.. 2 recent mammogram in December of 2015 negative.  Further evaluation by surgeon revealed a palpable mass in the left upper outer quadrant near axilla.  Ultrasound revealed one small normal-appearing lymph node 0.6 cm in lower limit of axilla.  Adjacent to this there was ill-defined multilobulated hypoechoic mass 1.8 x 2.6 cm.  Biopsy of the left axillary mass was done which was positive for high-grade carcinoma.hormone receptors are pending  2,estrogen receptor positive.  Progesterone receptor positive.  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 3.  MRI scan of breast (January, 2015) reveals multiple abnormal enlarged left axillary lymph node There are 2 oval some circumscribed mass at 2:30 and 3:00 position of the left breast.  0.6 cm x 1.3 cm and 4 x 7 mm Non-mass enhancement extending 2.4 cm these masses is 3.6 cm mass.  Findings suggestive of several pulmonary nodules.  PET  scan shows extensive disease, 4.started on Taxotere,perjeta, Herceptin(January 03, 2015). 5.  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  Patient is excellent response to Taxotere per time and Herceptin now on Herceptin and anti-hormonal maintenance therapy No bony pain. Appetite is improved.  No nausea.  No vomiting.  No diarrhea. No shortness of breath or chest pain Patient had a repeat MUGA scan of the heart.  Here for further follow-up and continuation of treatment.  No chills.  No fever.  Getting extremely well for bone metastases. Patient is here for further follow-up regarding stage IV carcinoma of breast. Patient received XGEVA on December 1 No chills no fever no nausea no vomiting no diarrhea. Taking  BRANCE and letrozole Patient is here for ongoing evaluation and continuation of treatment.   MUGA scan was done in November which was stable.  Patient also had a PET scan done which is has been reviewed independently and so stable disease. Here for further evaluation and continuation of therapy Patient has another MUGA scan of the heart scheduled on 7 marched Here for further follow-up and treatment consideration No bony pain appetite has been stable   REVIEW OF SYSTEMS:    general status: Patient is feeling weak and tired.  No change in a performance status.  No chills.  No fever. HEENT:   No evidence of stomatitis Lungs: No cough or shortness of breath Cardiac: No chest pain or paroxysmal nocturnal dyspnea GI: No nausea no vomiting no diarrhea no abdominal pain Skin: No rash Lower extremity no swelling Neurological system: No tingling.  No numbness.  No other focal signs Musculoskeletal system no bony pains  As per HPI. Otherwise, a complete review of systems is negatve.  PAST MEDICAL HISTORY: Past Medical History  Diagnosis Date  . Hypertension 2009  . 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 Special screening for malignant neoplasms, colon   . Hyperlipidemia 1990  . Floaters 2014  . Cancer [facility]) 2010    right breast ductal carcinoma in situ  . 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 ER 90%, PR 50%, MammoSite partial breast radiation  . Breast cancer ([place]) 02/21/2009  . 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 Axillary mass 12/15/2014    PAST SURGICAL HISTORY: Past Surgical History  Procedure Laterality Date  . Appendectomy  1956  . Partial hysterectomy  1977  . Colonoscopy  2008    ??? Md  . Breast surgery Right 2010    wide excision  . Breast biopsy Right 2010    stereo  . Left axilla biopsy Left 12-14-14    FAMILY HISTORY Family History  Problem Relation Age of Onset  . Cancer Other     unknown family members with ovarian,colon,breast cancers  . Hypertension Mother   . Heart disease Daughter     MI    ADVANCED DIRECTIVES:  Patient does have advance healthcare directive, Patient   does not desire to make any changes HEALTH MAINTENANCE: Social History  Substance Use Topics  . Smoking status: Never Smoker   . Smokeless tobacco: Never Used  . Alcohol Use: No      Allergies  Allergen Reactions  . Penicillin G     Other reaction(s): Pruritic rash  . Sulfa Antibiotics Rash    Other reaction(s): Weal    Current Outpatient Prescriptions  Medication Sig Dispense Refill  . ALPRAZolam (XANAX) 0.5 MG tablet TAKE 1 TABLET BY MOUTH 3 TIMES A DAY AS NEEDED 90 tablet 0  . aspirin 81 MG tablet Take 81 mg by mouth daily.    . Calcium 600-200 MG-UNIT tablet Take 1 tablet by mouth 2 (two) times daily. 120 tablet 3  . KLOR-CON M20 20 MEQ tablet TAKE 2 TABLETS BY MOUTH EVERY DAY 60 tablet 0  . letrozole (FEMARA) 2.5 MG tablet Take 1 tablet (2.5 mg total) by mouth daily. 30 tablet 6  . metFORMIN (GLUCOPHAGE) 500 MG tablet Take 500 mg by mouth 2 (two) times daily with a meal.     . ondansetron (ZOFRAN) 4 MG tablet Take 4 mg by mouth every 8 (eight) hours as needed for nausea or vomiting.    .  ONE TOUCH ULTRA TEST test strip     . palbociclib (IBRANCE) 125 MG capsule Take 1 capsule (125 mg total) by mouth daily with breakfast. Take whole with food for 21 days then 7 days off. 21 capsule 3  . rosuvastatin (CRESTOR) 5 MG tablet Take 1 tablet (5 mg total) by mouth daily. 90 tablet 1  . valsartan-hydrochlorothiazide (DIOVAN-HCT) 320-25 MG tablet TAKE 1 TABLET BY MOUTH DAILY 30 tablet 6   No current facility-administered medications for this visit.   Facility-Administered Medications Ordered in Other Visits  Medication Dose Route Frequency Provider Last Rate Last Dose  . heparin lock flush 100 unit/mL  250 Units Intracatheter PRN Forest Gleason, MD      . sodium chloride 0.9 % 250 mL with potassium chloride 40 mEq infusion   Intravenous Continuous Forest Gleason, MD   Stopped at 05/03/15 1229  .  sodium chloride 0.9 % injection 10 mL  10 mL Intracatheter PRN Forest Gleason, MD   10 mL at 04/18/15 1545    OBJECTIVE:  Filed Vitals:   02/09/16 1330  BP: 176/93  Pulse: 89  Temp: 97.7 F (36.5 C)  Resp: 18     Body mass index is 26.51 kg/(m^2).    ECOG FS:1 - Symptomatic but completely ambulatory  PHYSICAL EXAM: gENERAL  status: Performance status is good.  Patient has not lost significant weight HEENT: No evidence of stomatitis. Sclera and conjunctivae :: No jaundice.   pale looking. Lungs: Air  entry equal on both sides.  No rhonchi.  No rales.  Cardiac: Heart sounds are normal.  No pericardial rub.  No murmur. Small subcutaneous nodule on the right side of the throat (chronic) Lymphatic system: Palpable lymph node in the axillary area. GI: Abdomen is soft.  No ascites.  Liver spleen not palpable.  No tenderness.  Bowel sounds are within normal limit Lower extremity: No edema Neurological system: Higher functions, cranial nerves intact no evidence of peripheral neuropathy. Skin: No rash.  No ecchymosis.. Right breast small palpable mass in the outer lower quadrant unchanged.  Right  axillary area no palpable mass.    LEFT BREAST .  FREE  of masses  LAB RESULTS:  Appointment on 02/09/2016  Component Date Value Ref Range Status  . WBC 02/09/2016 8.0  3.6 - 11.0 K/uL Final  . RBC 02/09/2016 4.31  3.80 - 5.20 MIL/uL Final  . Hemoglobin 02/09/2016 13.1  12.0 - 16.0 g/dL Final  . HCT 02/09/2016 39.1  35.0 - 47.0 % Final  . MCV 02/09/2016 90.8  80.0 - 100.0 fL Final  . MCH 02/09/2016 30.4  26.0 - 34.0 pg Final  . MCHC 02/09/2016 33.5  32.0 - 36.0 g/dL Final  . RDW 02/09/2016 14.2  11.5 - 14.5 % Final  . Platelets 02/09/2016 156  150 - 440 K/uL Final  . Neutrophils Relative % 02/09/2016 63   Final  . Neutro Abs 02/09/2016 5.0  1.4 - 6.5 K/uL Final  . Lymphocytes Relative 02/09/2016 27   Final  . Lymphs Abs 02/09/2016 2.2  1.0 - 3.6 K/uL Final  . Monocytes Relative 02/09/2016 7   Final  . Monocytes Absolute 02/09/2016 0.5  0.2 - 0.9 K/uL Final  . Eosinophils Relative 02/09/2016 4   Final  . Eosinophils Absolute 02/09/2016 0.3  0 - 0.7 K/uL Final  . Basophils Relative 02/09/2016 1   Final  . Basophils Absolute 02/09/2016 0.1  0 - 0.1 K/uL Final  . Sodium 02/09/2016 139  135 - 145 mmol/L Final  . Potassium 02/09/2016 3.8  3.5 - 5.1 mmol/L Final  . Chloride 02/09/2016 102  101 - 111 mmol/L Final  . CO2 02/09/2016 29  22 - 32 mmol/L Final  . Glucose, Bld 02/09/2016 140* 65 - 99 mg/dL Final  . BUN 02/09/2016 11  6 - 20 mg/dL Final  . Creatinine, Ser 02/09/2016 0.63  0.44 - 1.00 mg/dL Final  . Calcium 02/09/2016 9.3  8.9 - 10.3 mg/dL Final  . Total Protein 02/09/2016 7.8  6.5 - 8.1 g/dL Final  . Albumin 02/09/2016 4.3  3.5 - 5.0 g/dL Final  . AST 02/09/2016 28  15 - 41 U/L Final  . ALT 02/09/2016 30  14 - 54 U/L Final  . Alkaline Phosphatase 02/09/2016 64  38 - 126 U/L Final  . Total Bilirubin 02/09/2016 0.8  0.3 - 1.2 mg/dL Final  .  GFR calc non Af Amer 02/09/2016 >60  >60 mL/min Final  . GFR calc Af Amer 02/09/2016 >60  >60 mL/min Final   Comment: (NOTE) The eGFR  has been calculated using the CKD EPI equation. This calculation has not been validated in all clinical situations. eGFR's persistently <60 mL/min signify possible Chronic Kidney Disease.   . Anion gap 02/09/2016 8  5 - 15 Final   Last MUGA scan was in November of 2016. Normal left ventricular ejection fraction.  58% slightly decreased from the previous 63%.          ASSESSMENT: STAGE 4  carcinoma of breast good response to chemotherapy on maintenance Herceptin therapy Started on letrozole and  IBRANCE  Tumor markers are slightly increased but last month was stable. No  significant side effect from [facility] or Herceptin.   MEDICAL DECISION MAKING:   All lab data has been reviewed.  Reassessment of the tumor with tumor marker as well as with PET scan prior to next treatment.  MUGA scan the heart has been reviewed independently No evidence of myelosuppression Continue IBRANCE letrozole and Herceptin  PET scan has been reviewed independently shows stable disease. Tumor markers being repeated.  Because of change in the insurance patient did not get any IBRANCE at this point in time Continue letrozole Continue Herceptin and XGEVA Repeat MUGA scan of the heart is pending  C2 7 0.29 is pending     Breast cancer   Staging form: Breast, AJCC 7th Edition     Clinical stage from 04/06/2015: Stage IV (T2, N2, M1) - Signed by Evlyn Kanner, NP on 04/06/2015   Forest Gleason, MD   02/09/2016 1:49 PM

## 2016-02-09 NOTE — Telephone Encounter (Signed)
Patient needs refills for Letrozole and Ibrance.

## 2016-02-10 LAB — CANCER ANTIGEN 27.29: CA 27.29: 43.1 U/mL — ABNORMAL HIGH (ref 0.0–38.6)

## 2016-02-13 ENCOUNTER — Telehealth: Payer: Self-pay | Admitting: *Deleted

## 2016-02-13 DIAGNOSIS — C50919 Malignant neoplasm of unspecified site of unspecified female breast: Secondary | ICD-10-CM

## 2016-02-13 MED ORDER — LETROZOLE 2.5 MG PO TABS: 2.5000 mg | ORAL_TABLET | Freq: Every day | ORAL | Status: DC

## 2016-02-13 NOTE — Telephone Encounter (Signed)
Called [employer] to check status of application for assistance with Ibrance. Pt has been approved for Ibrance through 12/16/16. [employer] will ship pt's medication and pt will receive within 2 business days.

## 2016-02-13 NOTE — Telephone Encounter (Signed)
Samantha left message questioning which drug patient gets for free through manufacturer, ibrance or letrozole or both. Left voicemail with Aldona Bar at [employer] that Leslee Home is free drug through manufacturer.

## 2016-02-13 NOTE — Telephone Encounter (Signed)
Called pt and left voicemail that Tammy Pope will be coming in the mail within the next 2 business days and letrozole refills will be [facility] to [facility] [place] pt to callback if has any further questions.

## 2016-02-21 ENCOUNTER — Ambulatory Visit: Admission: RE | Admit: 2016-02-21 | Payer: Medicare Other | Source: Ambulatory Visit

## 2016-02-24 NOTE — Telephone Encounter (Signed)
done

## 2016-02-27 ENCOUNTER — Ambulatory Visit
Admission: RE | Admit: 2016-02-27 | Discharge: 2016-02-27 | Disposition: A | Discharge status: Home / Self Care | Payer: Medicare Other | Source: Ambulatory Visit | Attending: Oncology | Admitting: Oncology

## 2016-02-27 DIAGNOSIS — Z79899 Other long term (current) drug therapy: Secondary | ICD-10-CM | POA: Diagnosis not present

## 2016-02-27 DIAGNOSIS — C50919 Malignant neoplasm of unspecified site of unspecified female breast: Secondary | ICD-10-CM | POA: Diagnosis not present

## 2016-02-27 DIAGNOSIS — C50619 Malignant neoplasm of axillary tail of unspecified female breast: Secondary | ICD-10-CM | POA: Insufficient documentation

## 2016-02-27 DIAGNOSIS — T451X5A Adverse effect of antineoplastic and immunosuppressive drugs, initial encounter: Secondary | ICD-10-CM | POA: Diagnosis not present

## 2016-02-27 DIAGNOSIS — Z9221 Personal history of antineoplastic chemotherapy: Secondary | ICD-10-CM | POA: Insufficient documentation

## 2016-02-27 MED ORDER — TECHNETIUM TC 99M-LABELED RED BLOOD CELLS IV KIT
20.0000 | PACK | Freq: Once | INTRAVENOUS | Status: AC | PRN
Start: 2016-02-27 — End: 2016-02-27
  Administered 2016-02-27: 20.52 via INTRAVENOUS

## 2016-03-01 ENCOUNTER — Ambulatory Visit: Payer: Medicare Other

## 2016-03-01 ENCOUNTER — Other Ambulatory Visit: Payer: Medicare Other

## 2016-03-01 ENCOUNTER — Ambulatory Visit: Payer: Medicare Other | Admitting: Family Medicine

## 2016-03-02 ENCOUNTER — Inpatient Hospital Stay (HOSPITAL_BASED_OUTPATIENT_CLINIC_OR_DEPARTMENT_OTHER): Payer: Medicare Other | Admitting: Family Medicine

## 2016-03-02 ENCOUNTER — Encounter: Payer: Self-pay | Admitting: Hematology and Oncology

## 2016-03-02 ENCOUNTER — Inpatient Hospital Stay: Payer: Medicare Other

## 2016-03-02 ENCOUNTER — Inpatient Hospital Stay: Payer: Medicare Other | Attending: Family Medicine

## 2016-03-02 VITALS — BP 167/93 | HR 80 | Temp 96.8°F | Resp 18 | Wt 141.2 lb

## 2016-03-02 DIAGNOSIS — E785 Hyperlipidemia, unspecified: Secondary | ICD-10-CM

## 2016-03-02 DIAGNOSIS — C7951 Secondary malignant neoplasm of bone: Secondary | ICD-10-CM

## 2016-03-02 DIAGNOSIS — C50612 Malignant neoplasm of axillary tail of left female breast: Secondary | ICD-10-CM

## 2016-03-02 DIAGNOSIS — I10 Essential (primary) hypertension: Secondary | ICD-10-CM | POA: Diagnosis not present

## 2016-03-02 DIAGNOSIS — Z7984 Long term (current) use of oral hypoglycemic drugs: Secondary | ICD-10-CM | POA: Insufficient documentation

## 2016-03-02 DIAGNOSIS — Z5112 Encounter for antineoplastic immunotherapy: Secondary | ICD-10-CM | POA: Diagnosis not present

## 2016-03-02 DIAGNOSIS — Z79899 Other long term (current) drug therapy: Secondary | ICD-10-CM | POA: Diagnosis not present

## 2016-03-02 DIAGNOSIS — Z170 Estrogen receptor positive status [ER+]: Secondary | ICD-10-CM | POA: Diagnosis not present

## 2016-03-02 DIAGNOSIS — Z853 Personal history of malignant neoplasm of breast: Secondary | ICD-10-CM

## 2016-03-02 DIAGNOSIS — Z7982 Long term (current) use of aspirin: Secondary | ICD-10-CM

## 2016-03-02 DIAGNOSIS — C50919 Malignant neoplasm of unspecified site of unspecified female breast: Secondary | ICD-10-CM

## 2016-03-02 DIAGNOSIS — C50611 Malignant neoplasm of axillary tail of right female breast: Secondary | ICD-10-CM

## 2016-03-02 DIAGNOSIS — C50619 Malignant neoplasm of axillary tail of unspecified female breast: Secondary | ICD-10-CM

## 2016-03-02 LAB — CBC WITH DIFFERENTIAL/PLATELET
BASOS PCT: 1 %
Basophils Absolute: 0 10*3/uL (ref 0–0.1)
Eosinophils Absolute: 0.2 10*3/uL (ref 0–0.7)
Eosinophils Relative: 5 %
HEMATOCRIT: 36.3 % (ref 35.0–47.0)
Hemoglobin: 12.5 g/dL (ref 12.0–16.0)
LYMPHS ABS: 1.7 10*3/uL (ref 1.0–3.6)
Lymphocytes Relative: 31 %
MCH: 30.6 pg (ref 26.0–34.0)
MCHC: 34.4 g/dL (ref 32.0–36.0)
MCV: 88.9 fL (ref 80.0–100.0)
MONO ABS: 0.2 10*3/uL (ref 0.2–0.9)
MONOS PCT: 4 %
Neutro Abs: 3.2 10*3/uL (ref 1.4–6.5)
Neutrophils Relative %: 59 %
Platelets: 135 10*3/uL — ABNORMAL LOW (ref 150–440)
RBC: 4.08 MIL/uL (ref 3.80–5.20)
RDW: 14.8 % — AB (ref 11.5–14.5)
WBC: 5.5 10*3/uL (ref 3.6–11.0)

## 2016-03-02 LAB — COMPREHENSIVE METABOLIC PANEL
ALBUMIN: 4.2 g/dL (ref 3.5–5.0)
ALK PHOS: 68 U/L (ref 38–126)
ALT: 26 U/L (ref 14–54)
ANION GAP: 6 (ref 5–15)
AST: 28 U/L (ref 15–41)
BILIRUBIN TOTAL: 0.8 mg/dL (ref 0.3–1.2)
BUN: 12 mg/dL (ref 6–20)
CALCIUM: 9.1 mg/dL (ref 8.9–10.3)
CO2: 28 mmol/L (ref 22–32)
Chloride: 104 mmol/L (ref 101–111)
Creatinine, Ser: 0.75 mg/dL (ref 0.44–1.00)
GLUCOSE: 170 mg/dL — AB (ref 65–99)
POTASSIUM: 3.7 mmol/L (ref 3.5–5.1)
Sodium: 138 mmol/L (ref 135–145)
TOTAL PROTEIN: 7.8 g/dL (ref 6.5–8.1)

## 2016-03-02 MED ORDER — TRASTUZUMAB CHEMO INJECTION 440 MG
6.0000 mg/kg | Freq: Once | INTRAVENOUS | Status: AC
  Administered 2016-03-02: 399 mg via INTRAVENOUS
  Filled 2016-03-02: qty 19

## 2016-03-02 MED ORDER — ACETAMINOPHEN 325 MG PO TABS
650.0000 mg | ORAL_TABLET | Freq: Once | ORAL | Status: AC
  Administered 2016-03-02: 650 mg via ORAL
  Filled 2016-03-02: qty 2

## 2016-03-02 MED ORDER — SODIUM CHLORIDE 0.9 % IV SOLN
Freq: Once | INTRAVENOUS | Status: AC
  Administered 2016-03-02: 15:00:00 via INTRAVENOUS
  Filled 2016-03-02: qty 1000

## 2016-03-02 MED ORDER — DENOSUMAB 120 MG/1.7ML ~~LOC~~ SOLN
120.0000 mg | Freq: Once | SUBCUTANEOUS | Status: AC
  Administered 2016-03-02: 120 mg via SUBCUTANEOUS
  Filled 2016-03-02: qty 1.7

## 2016-03-02 MED ORDER — DIPHENHYDRAMINE HCL 25 MG PO CAPS
50.0000 mg | ORAL_CAPSULE | Freq: Once | ORAL | Status: AC
  Administered 2016-03-02: 50 mg via ORAL
  Filled 2016-03-02: qty 2

## 2016-03-02 MED ORDER — SODIUM CHLORIDE 0.9% FLUSH
10.0000 mL | Freq: Once | INTRAVENOUS | Status: AC
  Administered 2016-03-02: 10 mL via INTRAVENOUS
  Filled 2016-03-02: qty 10

## 2016-03-02 MED ORDER — HEPARIN SOD (PORK) LOCK FLUSH 100 UNIT/ML IV SOLN
500.0000 [IU] | Freq: Once | INTRAVENOUS | Status: AC
  Administered 2016-03-02: 500 [IU] via INTRAVENOUS
  Filled 2016-03-02: qty 5

## 2016-03-02 NOTE — Progress Notes (Signed)
[place]  Telephone:(336) [phone]  Fax:(336) [phone]     Tammy Pope DOB: 08-Aug-1946  MR#: [id number]  QAS#:[id number]  Patient Care Team: Kathrine Haddock, NP as PCP - General (Nurse Practitioner) Robert Bellow, MD (General Surgery)  CHIEF COMPLAINT:  Chief Complaint  Patient presents with  . Breast Cancer    INTERVAL HISTORY:  Patient is here for further follow-up and treatment consideration regarding stage IV carcinoma of breast with metastasis to the bone. She is currently on maintenance Herceptin as well as oral Ibrance and letrozole. Patient also receives [place] that every 4 weeks. She reports overall feeling fairly well and denies any acute complaints. Patient did have a MUGA scan performed on March 13 with an ejection fraction that has increased to 66%. Nursing staff is working with [employer] to continue with coverage of Ibrance.  REVIEW OF SYSTEMS:   Review of Systems  Constitutional: Negative for fever, chills, weight loss, malaise/fatigue and diaphoresis.  HENT: Negative.   Eyes: Negative.   Respiratory: Negative for cough, hemoptysis, sputum production, shortness of breath and wheezing.   Cardiovascular: Negative for chest pain, palpitations, orthopnea, claudication, leg swelling and PND.  Gastrointestinal: Negative for heartburn, nausea, vomiting, abdominal pain, diarrhea, constipation, blood in stool and melena.  Genitourinary: Negative.   Musculoskeletal: Negative.   Skin: Negative.   Neurological: Negative for dizziness, tingling, focal weakness, seizures and weakness.  Endo/Heme/Allergies: Does not bruise/bleed easily.  Psychiatric/Behavioral: Negative for depression. The patient is not nervous/anxious and does not have insomnia.     As per HPI. Otherwise, a complete review of systems is negatve.  ONCOLOGY HISTORY: Oncology History   1.history of carcinoma of right breast status post lumpectomy and radiation therapy (upper and outer  quadrant) February 21, 2009.  Estrogen receptor 90%.  Progesterone receptor 60%.  Patient had lumpectomy and MammoSite partial breast radiation therapy followed by tamoxifen for 5 years.  Patient is still taking tamoxifen.. 2 recent mammogram in December of 2015 negative.  Further evaluation by surgeon revealed a palpable mass in the left upper outer quadrant near axilla.  Ultrasound revealed one small normal-appearing lymph node 0.6 cm in lower limit of axilla.  Adjacent to this there was ill-defined multilobulated hypoechoic mass 1.8 x 2.6 cm.  Biopsy of the left axillary mass was done which was positive for high-grade carcinoma.hormone receptors are pending  2,estrogen receptor positive.  Progesterone receptor positive.  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 3.  MRI scan of breast (January, 2015) reveals multiple abnormal enlarged left axillary lymph node There are 2 oval some circumscribed mass at 2:30 and 3:00 position of the left breast.  0.6 cm x 1.3 cm and 4 x 7 mm Non-mass enhancement extending 2.4 cm these masses is 3.6 cm mass.  Findings suggestive of several pulmonary nodules.  PET  scan shows extensive disease, 4.started on Taxotere,perjeta, Herceptin(January 03, 2015). 5.  Patient is on maintenance Herceptin that has been started on letrozole   And [facility] of 2016     Breast cancer ([place])   12/08/2013 Initial Diagnosis Breast cancer    Malignant neoplasm of axillary tail of female breast ([place])   12/16/2014 Initial Diagnosis Malignant neoplasm of axilla    PAST MEDICAL HISTORY: Past Medical History  Diagnosis Date  . Hypertension 2009  . 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 Special screening for malignant neoplasms, colon   . Hyperlipidemia 1990  . Floaters 2014  . Cancer ([place]) 2010  right breast ductal carcinoma in situ  . Malignant neoplasm of upper-outer quadrant of female breast [facility]) February 21, 2009    DCIS of the right  breast, intermediate grade, resected the negative margins. ER 90%, PR 50%, MammoSite partial breast radiation  . Breast cancer ([place]) 02/21/2009  . Malignant neoplasm of axillary tail of female breast ([place]) 12/16/2014  . Axillary mass 12/15/2014    PAST SURGICAL HISTORY: Past Surgical History  Procedure Laterality Date  . Appendectomy  1956  . Partial hysterectomy  1977  . Colonoscopy  2008    ??? Md  . Breast surgery Right 2010    wide excision  . Breast biopsy Right 2010    stereo  . Left axilla biopsy Left 12-14-14    FAMILY HISTORY Family History  Problem Relation Age of Onset  . Cancer Other     unknown family members with ovarian,colon,breast cancers  . Hypertension Mother   . Heart disease Daughter     MI    GYNECOLOGIC HISTORY:  No LMP recorded. Patient has had a hysterectomy.     ADVANCED DIRECTIVES:    HEALTH MAINTENANCE: Social History  Substance Use Topics  . Smoking status: Never Smoker   . Smokeless tobacco: Never Used  . Alcohol Use: No     Allergies  Allergen Reactions  . Penicillin G     Other reaction(s): Pruritic rash  . Sulfa Antibiotics Rash    Other reaction(s): Weal    Current Outpatient Prescriptions  Medication Sig Dispense Refill  . ALPRAZolam (XANAX) 0.5 MG tablet TAKE 1 TABLET BY MOUTH 3 TIMES A DAY AS NEEDED 90 tablet 0  . aspirin 81 MG tablet Take 81 mg by mouth daily.    . Calcium 600-200 MG-UNIT tablet Take 1 tablet by mouth 2 (two) times daily. 120 tablet 3  . KLOR-CON M20 20 MEQ tablet TAKE 2 TABLETS BY MOUTH EVERY DAY 60 tablet 0  . letrozole (FEMARA) 2.5 MG tablet Take 1 tablet (2.5 mg total) by mouth daily. 30 tablet 6  . metFORMIN (GLUCOPHAGE) 500 MG tablet Take 500 mg by mouth 2 (two) times daily with a meal.     . ondansetron (ZOFRAN) 4 MG tablet Take 4 mg by mouth every 8 (eight) hours as needed for nausea or vomiting.    . ONE TOUCH ULTRA TEST test strip     . palbociclib (IBRANCE) 125 MG capsule Take 1 capsule (125  mg total) by mouth daily with breakfast. Take whole with food for 21 days then 7 days off. 21 capsule 3  . rosuvastatin (CRESTOR) 5 MG tablet Take 1 tablet (5 mg total) by mouth daily. 90 tablet 1  . valsartan-hydrochlorothiazide (DIOVAN-HCT) 320-25 MG tablet TAKE 1 TABLET BY MOUTH DAILY 30 tablet 6   No current facility-administered medications for this visit.   Facility-Administered Medications Ordered in Other Visits  Medication Dose Route Frequency Provider Last Rate Last Dose  . heparin lock flush 100 unit/mL  250 Units Intracatheter PRN Forest Gleason, MD      . heparin lock flush 100 unit/mL  500 Units Intravenous Once Forest Gleason, MD      . sodium chloride 0.9 % 250 mL with potassium chloride 40 mEq infusion   Intravenous Continuous Forest Gleason, MD   Stopped at 05/03/15 1229  . sodium chloride 0.9 % injection 10 mL  10 mL Intracatheter PRN Forest Gleason, MD   10 mL at 04/18/15 1545    OBJECTIVE: BP 167/93 mmHg  Pulse  80  Temp(Src) 96.8 F (36 C) (Tympanic)  Resp 18  Wt 141 lb 3 oz (64.042 kg)   Body mass index is 26.69 kg/(m^2).    ECOG FS:0 - Asymptomatic  General: Well-developed, well-nourished, no acute distress. Eyes: Pink conjunctiva, anicteric sclera. HEENT: Normocephalic, moist mucous membranes, clear oropharnyx. Lungs: Clear to auscultation bilaterally. Heart: Regular rate and rhythm. No rubs, murmurs, or gallops. Abdomen: Soft, nontender, nondistended. No organomegaly noted, normoactive bowel sounds. Musculoskeletal: No edema, cyanosis, or clubbing. Neuro: Alert, answering all questions appropriately. Cranial nerves grossly intact. Skin: No rashes or petechiae noted. Psych: Normal affect. Lymphatics: No cervical, clavicular lymphadenopathy.   LAB RESULTS:  Infusion on 03/02/2016  Component Date Value Ref Range Status  . WBC 03/02/2016 5.5  3.6 - 11.0 K/uL Final  . RBC 03/02/2016 4.08  3.80 - 5.20 MIL/uL Final  . Hemoglobin 03/02/2016 12.5  12.0 - 16.0 g/dL  Final  . HCT 03/02/2016 36.3  35.0 - 47.0 % Final  . MCV 03/02/2016 88.9  80.0 - 100.0 fL Final  . MCH 03/02/2016 30.6  26.0 - 34.0 pg Final  . MCHC 03/02/2016 34.4  32.0 - 36.0 g/dL Final  . RDW 03/02/2016 14.8* 11.5 - 14.5 % Final  . Platelets 03/02/2016 135* 150 - 440 K/uL Final  . Neutrophils Relative % 03/02/2016 59   Final  . Neutro Abs 03/02/2016 3.2  1.4 - 6.5 K/uL Final  . Lymphocytes Relative 03/02/2016 31   Final  . Lymphs Abs 03/02/2016 1.7  1.0 - 3.6 K/uL Final  . Monocytes Relative 03/02/2016 4   Final  . Monocytes Absolute 03/02/2016 0.2  0.2 - 0.9 K/uL Final  . Eosinophils Relative 03/02/2016 5   Final  . Eosinophils Absolute 03/02/2016 0.2  0 - 0.7 K/uL Final  . Basophils Relative 03/02/2016 1   Final  . Basophils Absolute 03/02/2016 0.0  0 - 0.1 K/uL Final  . Sodium 03/02/2016 138  135 - 145 mmol/L Final  . Potassium 03/02/2016 3.7  3.5 - 5.1 mmol/L Final  . Chloride 03/02/2016 104  101 - 111 mmol/L Final  . CO2 03/02/2016 28  22 - 32 mmol/L Final  . Glucose, Bld 03/02/2016 170* 65 - 99 mg/dL Final  . BUN 03/02/2016 12  6 - 20 mg/dL Final  . Creatinine, Ser 03/02/2016 0.75  0.44 - 1.00 mg/dL Final  . Calcium 03/02/2016 9.1  8.9 - 10.3 mg/dL Final  . Total Protein 03/02/2016 7.8  6.5 - 8.1 g/dL Final  . Albumin 03/02/2016 4.2  3.5 - 5.0 g/dL Final  . AST 03/02/2016 28  15 - 41 U/L Final  . ALT 03/02/2016 26  14 - 54 U/L Final  . Alkaline Phosphatase 03/02/2016 68  38 - 126 U/L Final  . Total Bilirubin 03/02/2016 0.8  0.3 - 1.2 mg/dL Final  . GFR calc non Af Amer 03/02/2016 >60  >60 mL/min Final  . GFR calc Af Amer 03/02/2016 >60  >60 mL/min Final   Comment: (NOTE) The eGFR has been calculated using the CKD EPI equation. This calculation has not been validated in all clinical situations. eGFR's persistently <60 mL/min signify possible Chronic Kidney Disease.   . Anion gap 03/02/2016 6  5 - 15 Final    STUDIES: No results found.  ASSESSMENT:  History of  carcinoma of right breast. 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  PLAN:  1. Left Breast cancer. Patient with bone metastasis, was started on Taxotere,  Perjeta, and Herceptin in January 2016. Patient is currently on maintenance Herceptin 6 mg/kg every 3 weeks as well as oral letrozole and Ibrance since July 2016. We'll proceed with Herceptin and asked you for today. Patient's last MUGA scan was on March 13 with an ejection fraction that had increased from 58% to 66%. Angie Fava is actively working with [employer] to ensure delivery of her next [employer].  Patient to return in 3 weeks for continued therapy. Patient expressed understanding and was in agreement with this plan. She also understands that She can call clinic at any time with any questions, concerns, or complaints.   Dr. Grayland Ormond was available for consultation and review of plan of care for this patient.  Breast cancer [facility])   Staging form: Breast, AJCC 7th Edition     Clinical stage from 04/06/2015: Stage IV (T2, N2, M1) - Signed by Evlyn Kanner, NP on 04/06/2015   Evlyn Kanner, NP   03/02/2016 2:22 PM

## 2016-03-05 ENCOUNTER — Telehealth: Payer: Self-pay | Admitting: *Deleted

## 2016-03-05 NOTE — Telephone Encounter (Signed)
Pt received Ibrance in the mail from [employer].

## 2016-03-07 ENCOUNTER — Other Ambulatory Visit: Payer: Self-pay | Admitting: Oncology

## 2016-03-09 ENCOUNTER — Telehealth: Payer: Self-pay | Admitting: *Deleted

## 2016-03-09 ENCOUNTER — Other Ambulatory Visit: Payer: Self-pay | Admitting: Family Medicine

## 2016-03-09 MED ORDER — POTASSIUM CHLORIDE CRYS ER 20 MEQ PO TBCR: 40.0000 meq | EXTENDED_RELEASE_TABLET | Freq: Every day | ORAL | Status: AC

## 2016-03-09 NOTE — Telephone Encounter (Signed)
[facility]  

## 2016-03-09 NOTE — Addendum Note (Signed)
Addended by: Telford Nab on: 03/09/2016 10:53 AM   Modules accepted: Orders

## 2016-03-09 NOTE — Telephone Encounter (Signed)
Requesting refill on Klor-Con.

## 2016-03-12 ENCOUNTER — Encounter: Payer: Self-pay | Admitting: Unknown Physician Specialty

## 2016-03-12 ENCOUNTER — Ambulatory Visit (INDEPENDENT_AMBULATORY_CARE_PROVIDER_SITE_OTHER): Payer: Medicare Other | Admitting: Unknown Physician Specialty

## 2016-03-12 VITALS — BP 167/82 | HR 92 | Temp 98.4°F | Ht 61.1 in | Wt 138.0 lb

## 2016-03-12 DIAGNOSIS — E1122 Type 2 diabetes mellitus with diabetic chronic kidney disease: Secondary | ICD-10-CM | POA: Diagnosis not present

## 2016-03-12 DIAGNOSIS — E785 Hyperlipidemia, unspecified: Secondary | ICD-10-CM

## 2016-03-12 DIAGNOSIS — N189 Chronic kidney disease, unspecified: Secondary | ICD-10-CM

## 2016-03-12 DIAGNOSIS — I129 Hypertensive chronic kidney disease with stage 1 through stage 4 chronic kidney disease, or unspecified chronic kidney disease: Secondary | ICD-10-CM

## 2016-03-12 DIAGNOSIS — Z0000 Encounter for general adult medical examination without abnormal findings: Secondary | ICD-10-CM

## 2016-03-12 LAB — LIPID PANEL PICCOLO, WAIVED
Chol/HDL Ratio Piccolo,Waive: 2.6 mg/dL
Cholesterol Piccolo, Waived: 156 mg/dL (ref <200)
HDL CHOL PICCOLO, WAIVED: 59 mg/dL (ref >59)
LDL Chol Calc Piccolo Waived: 61 mg/dL (ref <100)
TRIGLYCERIDES PICCOLO,WAIVED: 180 mg/dL — AB (ref <150)
VLDL Chol Calc Piccolo,Waive: 36 mg/dL — ABNORMAL HIGH (ref <30)

## 2016-03-12 LAB — BAYER DCA HB A1C WAIVED: HB A1C: 6.8 % (ref <7.0)

## 2016-03-12 MED ORDER — AMLODIPINE BESYLATE 5 MG PO TABS: 5.0000 mg | ORAL_TABLET | Freq: Every day | ORAL | Status: DC

## 2016-03-12 NOTE — Assessment & Plan Note (Signed)
High today.  Add Amlodipine 5 mg

## 2016-03-12 NOTE — Addendum Note (Signed)
Addended by: Kathrine Haddock on: 03/12/2016 02:20 PM   Modules accepted: Orders

## 2016-03-12 NOTE — Assessment & Plan Note (Signed)
Stable with Hgb A1C of 6.8

## 2016-03-12 NOTE — Telephone Encounter (Signed)
Ref Range 10 days ago 1 month ago    Sodium 135 - 145 mmol/L 138 139    Potassium 3.5 - 5.1 mmol/L 3.7 3.8

## 2016-03-12 NOTE — Progress Notes (Signed)
BP 167/82 mmHg  Pulse 92  Temp(Src) 98.4 F (36.9 C)  Ht 5' 1.1" (1.552 m)  Wt 138 lb (62.596 kg)  BMI 25.99 kg/m2  SpO2 92%  LMP  (LMP Unknown)   Subjective:    Patient ID: Tammy Pope, female    DOB: [birth year], 70 y.o.   MRN: [id number]  HPI: Tammy Pope is a 70 y.o. female  Chief Complaint  Patient presents with  . Medicare Wellness   Functional Status Survey: Is the patient deaf or have difficulty hearing?: No Does the patient have difficulty seeing, even when wearing glasses/contacts?: No Does the patient have difficulty concentrating, remembering, or making decisions?: No Does the patient have difficulty walking or climbing stairs?: No Does the patient have difficulty dressing or bathing?: No Does the patient have difficulty doing errands alone such as visiting a doctor's office or shopping?: No  Depression screen [facility] 2/9 03/12/2016 09/16/2015  Decreased Interest 0 0  Down, Depressed, Hopeless 0 0  PHQ - 2 Score 0 0    Past Medical History  Diagnosis Date  . Hypertension 2009  . 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 Special screening for malignant neoplasms, colon   . Hyperlipidemia 1990  . Floaters 2014  . Cancer [facility]) 2010    right breast ductal carcinoma in situ  . Malignant neoplasm of upper-outer quadrant of female breast [facility]) February 21, 2009    DCIS of the right breast, intermediate grade, resected the negative margins. ER 90%, PR 50%, MammoSite partial breast radiation  . Breast cancer ([place]) 02/21/2009  . Malignant neoplasm of axillary tail of female breast ([place]) 12/16/2014  . Axillary mass 12/15/2014    Family History  Problem Relation Age of Onset  . Cancer Other     unknown family members with ovarian,colon,breast cancers  . Hypertension Mother   . Heart disease Daughter     MI   Past Surgical History  Procedure Laterality Date  . Appendectomy  1956  . Partial hysterectomy  1977  . Colonoscopy  2008     ??? Md  . Breast surgery Right 2010    wide excision  . Breast biopsy Right 2010    stereo  . Left axilla biopsy Left 12-14-14   Diabetes:  Using medications without difficulties No hypoglycemic episodes No hyperglycemic episodes Feet problems: none Blood Sugars averaging: "just a tad high but not much" eye exam within last year  Hypertension:  Using medications without difficulty Average home BPs "a little bit high"   Using medication without problems or lightheadedness No chest pain with exertion or shortness of breath No Edema  Elevated Cholesterol: Using medications without problems: No Muscle aches Diet compliance Exercise  Mental status seems to be low, unable to fully assess today and will due a full assessment next visit  Relevant past medical, surgical, family and social history reviewed and updated as indicated. Interim medical history since our last visit reviewed. Allergies and medications reviewed and updated.  Review of Systems  Constitutional: Negative.   HENT: Negative.   Eyes: Negative.   Respiratory: Negative.   Cardiovascular: Negative.   Gastrointestinal: Negative.   Endocrine: Negative.   Genitourinary: Negative.   Musculoskeletal: Negative.   Skin: Negative.   Allergic/Immunologic: Negative.   Neurological: Negative.   Hematological: Negative.   Psychiatric/Behavioral: Negative.     Per HPI unless specifically indicated above     Objective:    BP 167/82 mmHg  Pulse 92  Temp(Src) 98.4 F (36.9 C)  Ht 5' 1.1" (1.552 m)  Wt 138 lb (62.596 kg)  BMI 25.99 kg/m2  SpO2 92%  LMP  (LMP Unknown)  Wt Readings from Last 3 Encounters:  03/12/16 138 lb (62.596 kg)  03/02/16 141 lb 3 oz (64.042 kg)  02/09/16 140 lb 3.4 oz (63.6 kg)    Physical Exam  Constitutional: She is oriented to person, place, and time. She appears well-developed and well-nourished. No distress.  HENT:  Head: Normocephalic and atraumatic.  Eyes: Conjunctivae and  lids are normal. Right eye exhibits no discharge. Left eye exhibits no discharge. No scleral icterus.  Neck: Normal range of motion. Neck supple. No JVD present. Carotid bruit is not present.  Cardiovascular: Normal rate, regular rhythm and normal heart sounds.   Pulmonary/Chest: Effort normal and breath sounds normal.  Abdominal: Normal appearance. There is no splenomegaly or hepatomegaly.  Musculoskeletal: Normal range of motion.  Neurological: She is alert and oriented to person, place, and time.  Skin: Skin is warm, dry and intact. No rash noted. No pallor.  Psychiatric: She has a normal mood and affect. Her behavior is normal. Judgment and thought content normal.    Results for orders placed or performed in visit on 03/02/16  CBC with Differential  Result Value Ref Range   WBC 5.5 3.6 - 11.0 K/uL   RBC 4.08 3.80 - 5.20 MIL/uL   Hemoglobin 12.5 12.0 - 16.0 g/dL   HCT 36.3 35.0 - 47.0 %   MCV 88.9 80.0 - 100.0 fL   MCH 30.6 26.0 - 34.0 pg   MCHC 34.4 32.0 - 36.0 g/dL   RDW 14.8 (H) 11.5 - 14.5 %   Platelets 135 (L) 150 - 440 K/uL   Neutrophils Relative % 59 %   Neutro Abs 3.2 1.4 - 6.5 K/uL   Lymphocytes Relative 31 %   Lymphs Abs 1.7 1.0 - 3.6 K/uL   Monocytes Relative 4 %   Monocytes Absolute 0.2 0.2 - 0.9 K/uL   Eosinophils Relative 5 %   Eosinophils Absolute 0.2 0 - 0.7 K/uL   Basophils Relative 1 %   Basophils Absolute 0.0 0 - 0.1 K/uL  Comprehensive metabolic panel  Result Value Ref Range   Sodium 138 135 - 145 mmol/L   Potassium 3.7 3.5 - 5.1 mmol/L   Chloride 104 101 - 111 mmol/L   CO2 28 22 - 32 mmol/L   Glucose, Bld 170 (H) 65 - 99 mg/dL   BUN 12 6 - 20 mg/dL   Creatinine, Ser 0.75 0.44 - 1.00 mg/dL   Calcium 9.1 8.9 - 10.3 mg/dL   Total Protein 7.8 6.5 - 8.1 g/dL   Albumin 4.2 3.5 - 5.0 g/dL   AST 28 15 - 41 U/L   ALT 26 14 - 54 U/L   Alkaline Phosphatase 68 38 - 126 U/L   Total Bilirubin 0.8 0.3 - 1.2 mg/dL   GFR calc non Af Amer >60 >60 mL/min   GFR  calc Af Amer >60 >60 mL/min   Anion gap 6 5 - 15      Assessment & Plan:   Problem List Items Addressed This Visit      Unprioritized   Hypertensive chronic kidney disease    High today.  Add Amlodipine 5 mg      Type 2 diabetes mellitus with diabetic chronic kidney disease ([place]) - Primary    Stable with Hgb A1C of 6.8  Relevant Orders   Bayer DCA Hb A1c Waived   Hyperlipidemia    Stable, continue present medications.        Relevant Orders   Bayer DCA Hb A1c Waived   Lipid Panel Piccolo, [place]    Other Visit Diagnoses    Benign hypertension with chronic kidney disease        Routine general medical examination at a health care facility        Relevant Orders    Hepatitis C antibody        Follow up plan: Return in about 4 weeks (around 04/09/2016).  Needs mini mental next visit

## 2016-03-12 NOTE — Assessment & Plan Note (Signed)
Stable, continue present medications.   

## 2016-03-14 LAB — HCV AB W REFLEX TO QUANT PCR

## 2016-03-15 ENCOUNTER — Other Ambulatory Visit: Payer: Self-pay | Admitting: Unknown Physician Specialty

## 2016-03-15 NOTE — Telephone Encounter (Signed)
Your patient 

## 2016-03-19 ENCOUNTER — Other Ambulatory Visit: Payer: Self-pay | Admitting: Oncology

## 2016-03-23 ENCOUNTER — Other Ambulatory Visit: Payer: Self-pay | Admitting: Oncology

## 2016-03-23 ENCOUNTER — Inpatient Hospital Stay: Payer: Medicare Other | Attending: Family Medicine

## 2016-03-23 ENCOUNTER — Inpatient Hospital Stay: Payer: Medicare Other

## 2016-03-23 VITALS — BP 147/80 | HR 80 | Temp 96.0°F

## 2016-03-23 DIAGNOSIS — C7951 Secondary malignant neoplasm of bone: Secondary | ICD-10-CM | POA: Insufficient documentation

## 2016-03-23 DIAGNOSIS — Z5112 Encounter for antineoplastic immunotherapy: Secondary | ICD-10-CM | POA: Diagnosis not present

## 2016-03-23 DIAGNOSIS — Z79899 Other long term (current) drug therapy: Secondary | ICD-10-CM | POA: Insufficient documentation

## 2016-03-23 DIAGNOSIS — C50619 Malignant neoplasm of axillary tail of unspecified female breast: Secondary | ICD-10-CM

## 2016-03-23 DIAGNOSIS — Z170 Estrogen receptor positive status [ER+]: Secondary | ICD-10-CM | POA: Diagnosis not present

## 2016-03-23 DIAGNOSIS — C50919 Malignant neoplasm of unspecified site of unspecified female breast: Secondary | ICD-10-CM

## 2016-03-23 DIAGNOSIS — C50411 Malignant neoplasm of upper-outer quadrant of right female breast: Secondary | ICD-10-CM | POA: Diagnosis not present

## 2016-03-23 LAB — CBC WITH DIFFERENTIAL/PLATELET
BASOS PCT: 1 %
Basophils Absolute: 0.1 10*3/uL (ref 0–0.1)
Eosinophils Absolute: 0.1 10*3/uL (ref 0–0.7)
Eosinophils Relative: 2 %
HEMATOCRIT: 38.3 % (ref 35.0–47.0)
HEMOGLOBIN: 13.2 g/dL (ref 12.0–16.0)
Lymphocytes Relative: 35 %
Lymphs Abs: 1.9 10*3/uL (ref 1.0–3.6)
MCH: 30.9 pg (ref 26.0–34.0)
MCHC: 34.6 g/dL (ref 32.0–36.0)
MCV: 89.5 fL (ref 80.0–100.0)
MONOS PCT: 7 %
Monocytes Absolute: 0.4 10*3/uL (ref 0.2–0.9)
NEUTROS ABS: 3 10*3/uL (ref 1.4–6.5)
NEUTROS PCT: 55 %
PLATELETS: 152 10*3/uL (ref 150–440)
RBC: 4.28 MIL/uL (ref 3.80–5.20)
RDW: 16.4 % — ABNORMAL HIGH (ref 11.5–14.5)
WBC: 5.4 10*3/uL (ref 3.6–11.0)

## 2016-03-23 MED ORDER — SODIUM CHLORIDE 0.9 % IV SOLN
Freq: Once | INTRAVENOUS | Status: AC
Start: 2016-03-23 — End: 2016-03-23
  Administered 2016-03-23: 15:00:00 via INTRAVENOUS
  Filled 2016-03-23: qty 1000

## 2016-03-23 MED ORDER — ACETAMINOPHEN 325 MG PO TABS
650.0000 mg | ORAL_TABLET | Freq: Once | ORAL | Status: AC
  Administered 2016-03-23: 650 mg via ORAL
  Filled 2016-03-23: qty 2

## 2016-03-23 MED ORDER — DIPHENHYDRAMINE HCL 25 MG PO CAPS
50.0000 mg | ORAL_CAPSULE | Freq: Once | ORAL | Status: AC
  Administered 2016-03-23: 50 mg via ORAL
  Filled 2016-03-23: qty 2

## 2016-03-23 MED ORDER — HEPARIN SOD (PORK) LOCK FLUSH 100 UNIT/ML IV SOLN
500.0000 [IU] | Freq: Once | INTRAVENOUS | Status: AC | PRN
  Administered 2016-03-23: 500 [IU]
  Filled 2016-03-23: qty 5

## 2016-03-23 MED ORDER — TRASTUZUMAB CHEMO INJECTION 440 MG
6.0000 mg/kg | Freq: Once | INTRAVENOUS | Status: AC
  Administered 2016-03-23: 399 mg via INTRAVENOUS
  Filled 2016-03-23: qty 19

## 2016-03-23 MED ORDER — SODIUM CHLORIDE 0.9 % IJ SOLN
10.0000 mL | INTRAMUSCULAR | Status: DC | PRN
  Administered 2016-03-23: 10 mL
  Filled 2016-03-23: qty 10

## 2016-03-30 ENCOUNTER — Inpatient Hospital Stay: Payer: Medicare Other

## 2016-03-30 DIAGNOSIS — C50611 Malignant neoplasm of axillary tail of right female breast: Secondary | ICD-10-CM

## 2016-03-30 DIAGNOSIS — Z5112 Encounter for antineoplastic immunotherapy: Secondary | ICD-10-CM | POA: Diagnosis not present

## 2016-03-30 DIAGNOSIS — C50411 Malignant neoplasm of upper-outer quadrant of right female breast: Secondary | ICD-10-CM | POA: Diagnosis not present

## 2016-03-30 DIAGNOSIS — C50919 Malignant neoplasm of unspecified site of unspecified female breast: Secondary | ICD-10-CM

## 2016-03-30 DIAGNOSIS — C50612 Malignant neoplasm of axillary tail of left female breast: Secondary | ICD-10-CM

## 2016-03-30 DIAGNOSIS — Z170 Estrogen receptor positive status [ER+]: Secondary | ICD-10-CM | POA: Diagnosis not present

## 2016-03-30 DIAGNOSIS — C7951 Secondary malignant neoplasm of bone: Secondary | ICD-10-CM | POA: Diagnosis not present

## 2016-03-30 DIAGNOSIS — C801 Malignant (primary) neoplasm, unspecified: Secondary | ICD-10-CM

## 2016-03-30 DIAGNOSIS — Z79899 Other long term (current) drug therapy: Secondary | ICD-10-CM | POA: Diagnosis not present

## 2016-03-30 LAB — CBC WITH DIFFERENTIAL/PLATELET
Basophils Absolute: 0.1 10*3/uL (ref 0–0.1)
Basophils Relative: 1 %
EOS ABS: 0.2 10*3/uL (ref 0–0.7)
EOS PCT: 3 %
HCT: 38.5 % (ref 35.0–47.0)
HEMOGLOBIN: 13 g/dL (ref 12.0–16.0)
LYMPHS ABS: 2 10*3/uL (ref 1.0–3.6)
LYMPHS PCT: 30 %
MCH: 30.4 pg (ref 26.0–34.0)
MCHC: 33.9 g/dL (ref 32.0–36.0)
MCV: 89.9 fL (ref 80.0–100.0)
MONOS PCT: 7 %
Monocytes Absolute: 0.5 10*3/uL (ref 0.2–0.9)
Neutro Abs: 3.9 10*3/uL (ref 1.4–6.5)
Neutrophils Relative %: 59 %
PLATELETS: 164 10*3/uL (ref 150–440)
RBC: 4.28 MIL/uL (ref 3.80–5.20)
RDW: 16.9 % — ABNORMAL HIGH (ref 11.5–14.5)
WBC: 6.5 10*3/uL (ref 3.6–11.0)

## 2016-03-30 LAB — COMPREHENSIVE METABOLIC PANEL
ALT: 27 U/L (ref 14–54)
AST: 33 U/L (ref 15–41)
Albumin: 4.4 g/dL (ref 3.5–5.0)
Alkaline Phosphatase: 59 U/L (ref 38–126)
Anion gap: 5 (ref 5–15)
BUN: 13 mg/dL (ref 6–20)
CO2: 26 mmol/L (ref 22–32)
Calcium: 8.9 mg/dL (ref 8.9–10.3)
Chloride: 106 mmol/L (ref 101–111)
Creatinine, Ser: 0.8 mg/dL (ref 0.44–1.00)
GFR calc Af Amer: >60 mL/min (ref >60)
GFR calc non Af Amer: >60 mL/min (ref >60)
Glucose, Bld: 170 mg/dL — ABNORMAL HIGH (ref 65–99)
Potassium: 3.8 mmol/L (ref 3.5–5.1)
Sodium: 137 mmol/L (ref 135–145)
Total Bilirubin: 0.6 mg/dL (ref 0.3–1.2)
Total Protein: 7.9 g/dL (ref 6.5–8.1)

## 2016-03-30 MED ORDER — DENOSUMAB 120 MG/1.7ML ~~LOC~~ SOLN
120.0000 mg | Freq: Once | SUBCUTANEOUS | Status: AC
  Administered 2016-03-30: 120 mg via SUBCUTANEOUS
  Filled 2016-03-30: qty 1.7

## 2016-04-13 ENCOUNTER — Ambulatory Visit (INDEPENDENT_AMBULATORY_CARE_PROVIDER_SITE_OTHER): Payer: Medicare Other | Admitting: Unknown Physician Specialty

## 2016-04-13 ENCOUNTER — Encounter: Payer: Self-pay | Admitting: Unknown Physician Specialty

## 2016-04-13 VITALS — BP 153/84 | HR 90 | Temp 98.4°F | Ht 62.3 in | Wt 141.2 lb

## 2016-04-13 DIAGNOSIS — L600 Ingrowing nail: Secondary | ICD-10-CM

## 2016-04-13 DIAGNOSIS — I129 Hypertensive chronic kidney disease with stage 1 through stage 4 chronic kidney disease, or unspecified chronic kidney disease: Secondary | ICD-10-CM | POA: Diagnosis not present

## 2016-04-13 NOTE — Progress Notes (Signed)
BP 153/84 mmHg  Pulse 90  Temp(Src) 98.4 F (36.9 C)  Ht 5' 2.3" (1.582 m)  Wt 141 lb 3.2 oz (64.048 kg)  BMI 25.59 kg/m2  SpO2 97%  LMP  (LMP Unknown)   Subjective:    Patient ID: Tammy Pope, female    DOB: November 04, 1946, 70 y.o.   MRN: [id number]  HPI: Tammy Pope is a 70 y.o. female  Chief Complaint  Patient presents with  . Hypertension  . other    pt states she knows she is due for eye exam   Hypertension Last week added Amlodipine to present medication.   Using medications without difficulty Average home BPs Not checking  No problems or lightheadedness No chest pain with exertion or shortness of breath No Edema  Toenail States medial edge was red and swollen.  Using Epson salts and improven                                                                                          Mini mental done and scored 26/28     Relevant past medical, surgical, family and social history reviewed and updated as indicated. Interim medical history since our last visit reviewed. Allergies and medications reviewed and updated.  Review of Systems  Per HPI unless specifically indicated above     Objective:    BP 153/84 mmHg  Pulse 90  Temp(Src) 98.4 F (36.9 C)  Ht 5' 2.3" (1.582 m)  Wt 141 lb 3.2 oz (64.048 kg)  BMI 25.59 kg/m2  SpO2 97%  LMP  (LMP Unknown)  Wt Readings from Last 3 Encounters:  04/13/16 141 lb 3.2 oz (64.048 kg)  03/12/16 138 lb (62.596 kg)  03/02/16 141 lb 3 oz (64.042 kg)    Physical Exam  Constitutional: She is oriented to person, place, and time. She appears well-developed and well-nourished. No distress.  HENT:  Head: Normocephalic and atraumatic.  Eyes: Conjunctivae and lids are normal. Right eye exhibits no discharge. Left eye exhibits no discharge. No scleral icterus.  Neck: Normal range of motion. Neck supple. No JVD present. Carotid bruit is not present.  Cardiovascular: Normal rate, regular rhythm and normal heart sounds.    Pulmonary/Chest: Effort normal and breath sounds normal.  Abdominal: Normal appearance. There is no splenomegaly or hepatomegaly.  Musculoskeletal: Normal range of motion.  Neurological: She is alert and oriented to person, place, and time.  Skin: Skin is warm, dry and intact. No rash noted. No pallor.  Psychiatric: She has a normal mood and affect. Her behavior is normal. Judgment and thought content normal.   Lateral edge of right oarge tonail mild erythema with clear drainage.    Results for orders placed or performed in visit on 03/30/16  CBC with Differential  Result Value Ref Range   WBC 6.5 3.6 - 11.0 K/uL   RBC 4.28 3.80 - 5.20 MIL/uL   Hemoglobin 13.0 12.0 - 16.0 g/dL   HCT 38.5 35.0 - 47.0 %   MCV 89.9 80.0 - 100.0 fL   MCH 30.4 26.0 - 34.0 pg   MCHC 33.9 32.0 - 36.0 g/dL   RDW  16.9 (H) 11.5 - 14.5 %   Platelets 164 150 - 440 K/uL   Neutrophils Relative % 59 %   Neutro Abs 3.9 1.4 - 6.5 K/uL   Lymphocytes Relative 30 %   Lymphs Abs 2.0 1.0 - 3.6 K/uL   Monocytes Relative 7 %   Monocytes Absolute 0.5 0.2 - 0.9 K/uL   Eosinophils Relative 3 %   Eosinophils Absolute 0.2 0 - 0.7 K/uL   Basophils Relative 1 %   Basophils Absolute 0.1 0 - 0.1 K/uL  Comprehensive metabolic panel  Result Value Ref Range   Sodium 137 135 - 145 mmol/L   Potassium 3.8 3.5 - 5.1 mmol/L   Chloride 106 101 - 111 mmol/L   CO2 26 22 - 32 mmol/L   Glucose, Bld 170 (H) 65 - 99 mg/dL   BUN 13 6 - 20 mg/dL   Creatinine, Ser 0.80 0.44 - 1.00 mg/dL   Calcium 8.9 8.9 - 10.3 mg/dL   Total Protein 7.9 6.5 - 8.1 g/dL   Albumin 4.4 3.5 - 5.0 g/dL   AST 33 15 - 41 U/L   ALT 27 14 - 54 U/L   Alkaline Phosphatase 59 38 - 126 U/L   Total Bilirubin 0.6 0.3 - 1.2 mg/dL   GFR calc non Af Amer >60 >60 mL/min   GFR calc Af Amer >60 >60 mL/min   Anion gap 5 5 - 15      Assessment & Plan:   Problem List Items Addressed This Visit      Unprioritized   Hypertensive chronic kidney disease - Primary     Improved but not to goal.  SBP is less than 150 and will continue to follow       Other Visit Diagnoses    Ingrown toenail        Seems to be better.  Continue with soaking and applying antibiotic ointment        Follow up plan: Return in about 5 months (around 09/13/2016).

## 2016-04-13 NOTE — Assessment & Plan Note (Signed)
Improved but not to goal.  SBP is less than 150 and will continue to follow

## 2016-04-16 ENCOUNTER — Other Ambulatory Visit: Payer: Self-pay | Admitting: *Deleted

## 2016-04-16 DIAGNOSIS — C50619 Malignant neoplasm of axillary tail of unspecified female breast: Secondary | ICD-10-CM

## 2016-04-18 ENCOUNTER — Inpatient Hospital Stay: Payer: Medicare Other | Attending: Oncology

## 2016-04-18 ENCOUNTER — Encounter: Payer: Self-pay | Admitting: Oncology

## 2016-04-18 ENCOUNTER — Inpatient Hospital Stay: Payer: Medicare Other

## 2016-04-18 ENCOUNTER — Inpatient Hospital Stay (HOSPITAL_BASED_OUTPATIENT_CLINIC_OR_DEPARTMENT_OTHER): Payer: Medicare Other | Admitting: Oncology

## 2016-04-18 VITALS — BP 129/76 | HR 87 | Temp 97.1°F | Resp 18 | Wt 139.1 lb

## 2016-04-18 DIAGNOSIS — Z5112 Encounter for antineoplastic immunotherapy: Secondary | ICD-10-CM | POA: Diagnosis not present

## 2016-04-18 DIAGNOSIS — I10 Essential (primary) hypertension: Secondary | ICD-10-CM | POA: Insufficient documentation

## 2016-04-18 DIAGNOSIS — C7951 Secondary malignant neoplasm of bone: Secondary | ICD-10-CM | POA: Insufficient documentation

## 2016-04-18 DIAGNOSIS — Z803 Family history of malignant neoplasm of breast: Secondary | ICD-10-CM

## 2016-04-18 DIAGNOSIS — E785 Hyperlipidemia, unspecified: Secondary | ICD-10-CM | POA: Insufficient documentation

## 2016-04-18 DIAGNOSIS — C50919 Malignant neoplasm of unspecified site of unspecified female breast: Secondary | ICD-10-CM

## 2016-04-18 DIAGNOSIS — C50412 Malignant neoplasm of upper-outer quadrant of left female breast: Secondary | ICD-10-CM | POA: Insufficient documentation

## 2016-04-18 DIAGNOSIS — R918 Other nonspecific abnormal finding of lung field: Secondary | ICD-10-CM | POA: Diagnosis not present

## 2016-04-18 DIAGNOSIS — Z7982 Long term (current) use of aspirin: Secondary | ICD-10-CM

## 2016-04-18 DIAGNOSIS — R5383 Other fatigue: Secondary | ICD-10-CM | POA: Diagnosis not present

## 2016-04-18 DIAGNOSIS — R531 Weakness: Secondary | ICD-10-CM | POA: Diagnosis not present

## 2016-04-18 DIAGNOSIS — Z8041 Family history of malignant neoplasm of ovary: Secondary | ICD-10-CM | POA: Insufficient documentation

## 2016-04-18 DIAGNOSIS — C779 Secondary and unspecified malignant neoplasm of lymph node, unspecified: Secondary | ICD-10-CM

## 2016-04-18 DIAGNOSIS — C50619 Malignant neoplasm of axillary tail of unspecified female breast: Secondary | ICD-10-CM

## 2016-04-18 DIAGNOSIS — Z800 Family history of malignant neoplasm of digestive organs: Secondary | ICD-10-CM

## 2016-04-18 DIAGNOSIS — Z170 Estrogen receptor positive status [ER+]: Secondary | ICD-10-CM | POA: Diagnosis not present

## 2016-04-18 DIAGNOSIS — C50611 Malignant neoplasm of axillary tail of right female breast: Secondary | ICD-10-CM

## 2016-04-18 DIAGNOSIS — Z79899 Other long term (current) drug therapy: Secondary | ICD-10-CM

## 2016-04-18 DIAGNOSIS — Z7984 Long term (current) use of oral hypoglycemic drugs: Secondary | ICD-10-CM | POA: Insufficient documentation

## 2016-04-18 LAB — COMPREHENSIVE METABOLIC PANEL
ALT: 22 U/L (ref 14–54)
AST: 27 U/L (ref 15–41)
Albumin: 4.5 g/dL (ref 3.5–5.0)
Alkaline Phosphatase: 63 U/L (ref 38–126)
Anion gap: 8 (ref 5–15)
BUN: 13 mg/dL (ref 6–20)
CHLORIDE: 103 mmol/L (ref 101–111)
CO2: 28 mmol/L (ref 22–32)
Calcium: 9.5 mg/dL (ref 8.9–10.3)
Creatinine, Ser: 0.75 mg/dL (ref 0.44–1.00)
Glucose, Bld: 129 mg/dL — ABNORMAL HIGH (ref 65–99)
POTASSIUM: 3.7 mmol/L (ref 3.5–5.1)
Sodium: 139 mmol/L (ref 135–145)
Total Bilirubin: 0.9 mg/dL (ref 0.3–1.2)
Total Protein: 8.1 g/dL (ref 6.5–8.1)

## 2016-04-18 LAB — CBC WITH DIFFERENTIAL/PLATELET
BASOS ABS: 0.1 10*3/uL (ref 0–0.1)
Basophils Relative: 1 %
EOS PCT: 4 %
Eosinophils Absolute: 0.3 10*3/uL (ref 0–0.7)
HCT: 38.1 % (ref 35.0–47.0)
Hemoglobin: 12.9 g/dL (ref 12.0–16.0)
LYMPHS PCT: 34 %
Lymphs Abs: 2.3 10*3/uL (ref 1.0–3.6)
MCH: 30.5 pg (ref 26.0–34.0)
MCHC: 33.7 g/dL (ref 32.0–36.0)
MCV: 90.4 fL (ref 80.0–100.0)
MONO ABS: 0.6 10*3/uL (ref 0.2–0.9)
Monocytes Relative: 9 %
Neutro Abs: 3.6 10*3/uL (ref 1.4–6.5)
Neutrophils Relative %: 52 %
PLATELETS: 154 10*3/uL (ref 150–440)
RBC: 4.22 MIL/uL (ref 3.80–5.20)
RDW: 16.9 % — ABNORMAL HIGH (ref 11.5–14.5)
WBC: 6.7 10*3/uL (ref 3.6–11.0)

## 2016-04-18 MED ORDER — DIPHENHYDRAMINE HCL 25 MG PO CAPS
50.0000 mg | ORAL_CAPSULE | Freq: Once | ORAL | Status: AC
  Administered 2016-04-18: 50 mg via ORAL
  Filled 2016-04-18: qty 2

## 2016-04-18 MED ORDER — SODIUM CHLORIDE 0.9% FLUSH
10.0000 mL | Freq: Once | INTRAVENOUS | Status: AC
  Administered 2016-04-18: 10 mL via INTRAVENOUS
  Filled 2016-04-18: qty 10

## 2016-04-18 MED ORDER — HEPARIN SOD (PORK) LOCK FLUSH 100 UNIT/ML IV SOLN: 500.0000 [IU] | Freq: Once | INTRAVENOUS | Status: DC | PRN

## 2016-04-18 MED ORDER — SODIUM CHLORIDE 0.9 % IJ SOLN
10.0000 mL | INTRAMUSCULAR | Status: DC | PRN
  Filled 2016-04-18: qty 10

## 2016-04-18 MED ORDER — SODIUM CHLORIDE 0.9 % IV SOLN
6.0000 mg/kg | Freq: Once | INTRAVENOUS | Status: AC
  Administered 2016-04-18: 399 mg via INTRAVENOUS
  Filled 2016-04-18: qty 19

## 2016-04-18 MED ORDER — SODIUM CHLORIDE 0.9 % IV SOLN
Freq: Once | INTRAVENOUS | Status: AC
  Administered 2016-04-18: 15:00:00 via INTRAVENOUS
  Filled 2016-04-18: qty 1000

## 2016-04-18 MED ORDER — HEPARIN SOD (PORK) LOCK FLUSH 100 UNIT/ML IV SOLN
500.0000 [IU] | Freq: Once | INTRAVENOUS | Status: AC
  Administered 2016-04-18: 500 [IU] via INTRAVENOUS
  Filled 2016-04-18: qty 5

## 2016-04-18 MED ORDER — ACETAMINOPHEN 325 MG PO TABS
650.0000 mg | ORAL_TABLET | Freq: Once | ORAL | Status: AC
  Administered 2016-04-18: 650 mg via ORAL
  Filled 2016-04-18: qty 2

## 2016-04-18 NOTE — Progress Notes (Signed)
rx for ibrance escribed to biologics.  [place] @ [facility] Telephone:([phone]) [phone]  [phone]     Tammy Pope OB: 71-08-1946  MR#: [id number]  VZD#:[id number]  Patient Care Team: Kathrine Haddock, NP as PCP - General (Nurse Practitioner) Robert Bellow, MD (General Surgery) Forest Gleason, MD (Oncology)  CHIEF COMPLAINT:  Chief Complaint  Patient presents with  . 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  Estrogen receptor 90%.  Progesterone receptor 60%.  Patient had lumpectomy and MammoSite partial breast radiation therapy followed by tamoxifen for 5 years.  Patient is still taking tamoxifen.. 2 recent mammogram in December of 2015 negative.  Further evaluation by surgeon revealed a palpable mass in the left upper outer quadrant near axilla.  Ultrasound revealed one small normal-appearing lymph node 0.6 cm in lower limit of axilla.  Adjacent to this there was ill-defined multilobulated hypoechoic mass 1.8 x 2.6 cm.  Biopsy of the left axillary mass was done which was positive for high-grade carcinoma.hormone receptors are pending  2,estrogen receptor positive.  Progesterone receptor positive.  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 3.  MRI scan of breast (January, 2015) reveals multiple abnormal enlarged left axillary lymph node There are 2 oval some circumscribed mass at 2:30 and 3:00 position of the left breast.  0.6 cm x 1.3 cm and 4 x 7 mm Non-mass enhancement extending 2.4 cm these masses is 3.6 cm mass.  Findings suggestive of several pulmonary nodules.  PET  scan shows extensive disease, 4.started on Taxotere,perjeta, Herceptin(January 03, 2015). 5.  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  Patient is excellent response to Taxotere per time and Herceptin now on Herceptin and anti-hormonal maintenance therapy No bony pain. Appetite is improved.  No nausea.  No vomiting.  No diarrhea. No shortness of breath or chest pain Patient has no bone pain appetite has been stable Patient is here for continuation of letrozole ,  ibrance  and Herceptin maintenance therapy for stage IV carcinoma breast Last MUGA scan of the heart was in March which was stable. No bone pains.  Appetite has been stable.  Patient is also due for next mammogram   REVIEW OF SYSTEMS:    general status: Patient is feeling weak and tired.  No change in a performance status.  No chills.  No fever. HEENT:   No evidence of stomatitis Lungs: No cough or shortness of breath Cardiac: No chest pain or paroxysmal nocturnal dyspnea GI: No nausea no vomiting no diarrhea no abdominal pain Skin: No rash Lower extremity no swelling Neurological system: No tingling.  No numbness.  No other focal signs Musculoskeletal system no bony pains  As per HPI. Otherwise, a complete review of systems is negatve.  PAST MEDICAL HISTORY: Past Medical History  Diagnosis Date  . Hypertension 2009  . 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 Special screening for malignant neoplasms, colon   . Hyperlipidemia 1990  . Floaters 2014  . Cancer [facility]) 2010    right breast ductal carcinoma in situ  . Malignant neoplasm of upper-outer quadrant of female breast [facility]) February 21, 2009    DCIS  of the right breast, intermediate grade, resected the negative margins. ER 90%, PR 50%, MammoSite partial breast radiation  . Breast cancer ([place]) 02/21/2009  . Malignant neoplasm of axillary tail of female breast ([place]) 12/16/2014  . Axillary mass 12/15/2014    PAST SURGICAL HISTORY: Past Surgical History  Procedure Laterality Date  . Appendectomy  1956  . Partial hysterectomy  1977  .  Colonoscopy  2008    ??? Md  . Breast surgery Right 2010    wide excision  . Breast biopsy Right 2010    stereo  . Left axilla biopsy Left 12-14-14    FAMILY HISTORY Family History  Problem Relation Age of Onset  . Cancer Other     unknown family members with ovarian,colon,breast cancers  . Hypertension Mother   . Heart disease Daughter     MI    ADVANCED DIRECTIVES:  Patient does have advance healthcare directive, Patient   does not desire to make any changes HEALTH MAINTENANCE: Social History  Substance Use Topics  . Smoking status: Never Smoker   . Smokeless tobacco: Never Used  . Alcohol Use: No      Allergies  Allergen Reactions  . Penicillin G     Other reaction(s): Pruritic rash  . Sulfa Antibiotics Rash    Other reaction(s): Weal    Current Outpatient Prescriptions  Medication Sig Dispense Refill  . ALPRAZolam (XANAX) 0.5 MG tablet TAKE 1 TABLET BY MOUTH 3 TIMES A DAY AS NEEDED 90 tablet 1  . amLODipine (NORVASC) 5 MG tablet Take 1 tablet (5 mg total) by mouth daily. 30 tablet 3  . aspirin 81 MG tablet Take 81 mg by mouth daily.    . Calcium 600-200 MG-UNIT tablet Take 1 tablet by mouth 2 (two) times daily. 120 tablet 3  . letrozole (FEMARA) 2.5 MG tablet Take 1 tablet (2.5 mg total) by mouth daily. 30 tablet 6  . metFORMIN (GLUCOPHAGE) 500 MG tablet TAKE 1 TABLET TWICE EACH DAY ORAL 60 tablet 5  . ONE TOUCH ULTRA TEST test strip     . palbociclib (IBRANCE) 125 MG capsule Take 1 capsule (125 mg total) by mouth daily with breakfast. Take whole with food for 21 days then 7 days off. 21 capsule 3  . potassium chloride SA (KLOR-CON M20) 20 MEQ tablet Take 2 tablets (40 mEq total) by mouth daily. 60 tablet 3  . rosuvastatin (CRESTOR) 5 MG tablet Take 1 tablet (5 mg total) by mouth daily. 90 tablet 1  . valsartan-hydrochlorothiazide (DIOVAN-HCT) 320-25 MG tablet TAKE 1 TABLET BY MOUTH DAILY 30 tablet 6   No current facility-administered medications for this  visit.   Facility-Administered Medications Ordered in Other Visits  Medication Dose Route Frequency Provider Last Rate Last Dose  . heparin lock flush 100 unit/mL  250 Units Intracatheter PRN Forest Gleason, MD      . heparin lock flush 100 unit/mL  500 Units Intravenous Once Forest Gleason, MD      . sodium chloride 0.9 % 250 mL with potassium chloride 40 mEq infusion   Intravenous Continuous Forest Gleason, MD   Stopped at 05/03/15 1229  . sodium chloride 0.9 % injection 10 mL  10 mL Intracatheter PRN Forest Gleason, MD   10 mL at 04/18/15 1545    OBJECTIVE:  Filed Vitals:   04/18/16 1404  BP: 129/76  Pulse: 87  Temp: 97.1 F (36.2 C)  Resp: 18     Body mass index is 25.22 kg/(m^2).  ECOG FS:1 - Symptomatic but completely ambulatory  PHYSICAL EXAM: gENERAL  status: Performance status is good.  Patient has not lost significant weight HEENT: No evidence of stomatitis. Sclera and conjunctivae :: No jaundice.   pale looking. Lungs: Air  entry equal on both sides.  No rhonchi.  No rales.  Cardiac: Heart sounds are normal.  No pericardial rub.  No murmur. Small subcutaneous nodule on the right side of the throat (chronic) Lymphatic system: Palpable lymph node in the axillary area. GI: Abdomen is soft.  No ascites.  Liver spleen not palpable.  No tenderness.  Bowel sounds are within normal limit Lower extremity: No edema Neurological system: Higher functions, cranial nerves intact no evidence of peripheral neuropathy. Skin: No rash.  No ecchymosis.. Right breast small palpable mass in the outer lower quadrant unchanged.  Right axillary area no palpable mass.    LEFT BREAST .  FREE  of masses  LAB RESULTS:  Infusion on 04/18/2016  Component Date Value Ref Range Status  . WBC 04/18/2016 6.7  3.6 - 11.0 K/uL Final  . RBC 04/18/2016 4.22  3.80 - 5.20 MIL/uL Final  . Hemoglobin 04/18/2016 12.9  12.0 - 16.0 g/dL Final  . HCT 04/18/2016 38.1  35.0 - 47.0 % Final  . MCV 04/18/2016 90.4  80.0 -  100.0 fL Final  . MCH 04/18/2016 30.5  26.0 - 34.0 pg Final  . MCHC 04/18/2016 33.7  32.0 - 36.0 g/dL Final  . RDW 04/18/2016 16.9* 11.5 - 14.5 % Final  . Platelets 04/18/2016 154  150 - 440 K/uL Final  . Neutrophils Relative % 04/18/2016 52   Final  . Neutro Abs 04/18/2016 3.6  1.4 - 6.5 K/uL Final  . Lymphocytes Relative 04/18/2016 34   Final  . Lymphs Abs 04/18/2016 2.3  1.0 - 3.6 K/uL Final  . Monocytes Relative 04/18/2016 9   Final  . Monocytes Absolute 04/18/2016 0.6  0.2 - 0.9 K/uL Final  . Eosinophils Relative 04/18/2016 4   Final  . Eosinophils Absolute 04/18/2016 0.3  0 - 0.7 K/uL Final  . Basophils Relative 04/18/2016 1   Final  . Basophils Absolute 04/18/2016 0.1  0 - 0.1 K/uL Final  . Sodium 04/18/2016 139  135 - 145 mmol/L Final  . Potassium 04/18/2016 3.7  3.5 - 5.1 mmol/L Final  . Chloride 04/18/2016 103  101 - 111 mmol/L Final  . CO2 04/18/2016 28  22 - 32 mmol/L Final  . Glucose, Bld 04/18/2016 129* 65 - 99 mg/dL Final  . BUN 04/18/2016 13  6 - 20 mg/dL Final  . Creatinine, Ser 04/18/2016 0.75  0.44 - 1.00 mg/dL Final  . Calcium 04/18/2016 9.5  8.9 - 10.3 mg/dL Final  . Total Protein 04/18/2016 8.1  6.5 - 8.1 g/dL Final  . Albumin 04/18/2016 4.5  3.5 - 5.0 g/dL Final  . AST 04/18/2016 27  15 - 41 U/L Final  . ALT 04/18/2016 22  14 - 54 U/L Final  . Alkaline Phosphatase 04/18/2016 63  38 - 126 U/L Final  . Total Bilirubin 04/18/2016 0.9  0.3 - 1.2 mg/dL Final  . GFR calc non Af Amer 04/18/2016 >60  >60 mL/min Final  . GFR calc Af Amer 04/18/2016 >60  >60 mL/min Final   Comment: (NOTE) The eGFR has been calculated using the CKD EPI equation. This calculation has not been validated in all clinical situations. eGFR's persistently <60 mL/min signify possible Chronic Kidney Disease.   Georgiann Hahn gap 04/18/2016  8  5 - 15 Final   Last MUGA scan was in November of 2016. Normal left ventricular ejection fraction.  58% slightly decreased from the previous  63%.          ASSESSMENT: STAGE 4  carcinoma of breast good response to chemotherapy on maintenance Herceptin therapy Started on letrozole and  IBRANCE  Tumor markers are slightly increased but last month was stable. No  significant side effect from [facility] or Herceptin.   MEDICAL DECISION MAKING:   All lab data has been reviewed.  Reassessment of the tumor with tumor marker as well as with PET scan prior to next treatment.  MUGA scan the heart has been reviewed independently No evidence of myelosuppression Continue IBRANCE letrozole and Herceptin  PET scan would be scheduled.  In May of 2017.  Mammogram has been scheduled.  Prior to next appointment Repeat tumor markers prior to next appointment     Breast cancer   Staging form: Breast, AJCC 7th Edition     Clinical stage from 04/06/2015: Stage IV (T2, N2, M1) - Signed by Evlyn Kanner, NP on 04/06/2015   Forest Gleason, MD   04/18/2016 2:08 PM

## 2016-04-22 ENCOUNTER — Encounter: Payer: Self-pay | Admitting: Oncology

## 2016-05-07 ENCOUNTER — Ambulatory Visit
Admission: RE | Admit: 2016-05-07 | Discharge: 2016-05-07 | Disposition: A | Discharge status: Home / Self Care | Payer: Medicare Other | Source: Ambulatory Visit | Attending: Oncology | Admitting: Oncology

## 2016-05-07 DIAGNOSIS — R928 Other abnormal and inconclusive findings on diagnostic imaging of breast: Secondary | ICD-10-CM | POA: Diagnosis not present

## 2016-05-07 DIAGNOSIS — C50919 Malignant neoplasm of unspecified site of unspecified female breast: Secondary | ICD-10-CM | POA: Diagnosis not present

## 2016-05-07 DIAGNOSIS — R938 Abnormal findings on diagnostic imaging of other specified body structures: Secondary | ICD-10-CM | POA: Insufficient documentation

## 2016-05-07 DIAGNOSIS — C50611 Malignant neoplasm of axillary tail of right female breast: Secondary | ICD-10-CM | POA: Diagnosis not present

## 2016-05-07 LAB — GLUCOSE, CAPILLARY: GLUCOSE-CAPILLARY: 128 mg/dL — AB (ref 65–99)

## 2016-05-07 MED ORDER — FLUDEOXYGLUCOSE F - 18 (FDG) INJECTION
12.6300 | Freq: Once | INTRAVENOUS | Status: AC | PRN
  Administered 2016-05-07: 12.63 via INTRAVENOUS

## 2016-05-09 ENCOUNTER — Inpatient Hospital Stay: Payer: Medicare Other

## 2016-05-09 ENCOUNTER — Inpatient Hospital Stay (HOSPITAL_BASED_OUTPATIENT_CLINIC_OR_DEPARTMENT_OTHER): Payer: Medicare Other | Admitting: Oncology

## 2016-05-09 ENCOUNTER — Encounter: Payer: Self-pay | Admitting: Oncology

## 2016-05-09 VITALS — BP 151/85 | HR 89 | Temp 97.0°F | Wt 139.3 lb

## 2016-05-09 DIAGNOSIS — C7951 Secondary malignant neoplasm of bone: Secondary | ICD-10-CM

## 2016-05-09 DIAGNOSIS — Z803 Family history of malignant neoplasm of breast: Secondary | ICD-10-CM

## 2016-05-09 DIAGNOSIS — Z8041 Family history of malignant neoplasm of ovary: Secondary | ICD-10-CM

## 2016-05-09 DIAGNOSIS — Z7982 Long term (current) use of aspirin: Secondary | ICD-10-CM

## 2016-05-09 DIAGNOSIS — C50412 Malignant neoplasm of upper-outer quadrant of left female breast: Secondary | ICD-10-CM

## 2016-05-09 DIAGNOSIS — Z170 Estrogen receptor positive status [ER+]: Secondary | ICD-10-CM

## 2016-05-09 DIAGNOSIS — C779 Secondary and unspecified malignant neoplasm of lymph node, unspecified: Secondary | ICD-10-CM

## 2016-05-09 DIAGNOSIS — Z79899 Other long term (current) drug therapy: Secondary | ICD-10-CM

## 2016-05-09 DIAGNOSIS — I10 Essential (primary) hypertension: Secondary | ICD-10-CM

## 2016-05-09 DIAGNOSIS — Z5112 Encounter for antineoplastic immunotherapy: Secondary | ICD-10-CM | POA: Diagnosis not present

## 2016-05-09 DIAGNOSIS — Z800 Family history of malignant neoplasm of digestive organs: Secondary | ICD-10-CM

## 2016-05-09 DIAGNOSIS — R918 Other nonspecific abnormal finding of lung field: Secondary | ICD-10-CM

## 2016-05-09 DIAGNOSIS — Z7984 Long term (current) use of oral hypoglycemic drugs: Secondary | ICD-10-CM

## 2016-05-09 DIAGNOSIS — C50919 Malignant neoplasm of unspecified site of unspecified female breast: Secondary | ICD-10-CM

## 2016-05-09 DIAGNOSIS — C50611 Malignant neoplasm of axillary tail of right female breast: Secondary | ICD-10-CM

## 2016-05-09 DIAGNOSIS — E785 Hyperlipidemia, unspecified: Secondary | ICD-10-CM

## 2016-05-09 DIAGNOSIS — R5383 Other fatigue: Secondary | ICD-10-CM

## 2016-05-09 DIAGNOSIS — R531 Weakness: Secondary | ICD-10-CM

## 2016-05-09 LAB — COMPREHENSIVE METABOLIC PANEL
ALT: 25 U/L (ref 14–54)
AST: 30 U/L (ref 15–41)
Albumin: 4.4 g/dL (ref 3.5–5.0)
Alkaline Phosphatase: 65 U/L (ref 38–126)
Anion gap: 6 (ref 5–15)
BUN: 11 mg/dL (ref 6–20)
CALCIUM: 9.1 mg/dL (ref 8.9–10.3)
CO2: 28 mmol/L (ref 22–32)
CREATININE: 0.73 mg/dL (ref 0.44–1.00)
Chloride: 104 mmol/L (ref 101–111)
GFR calc Af Amer: >60 mL/min (ref >60)
Glucose, Bld: 254 mg/dL — ABNORMAL HIGH (ref 65–99)
POTASSIUM: 3.7 mmol/L (ref 3.5–5.1)
SODIUM: 138 mmol/L (ref 135–145)
TOTAL PROTEIN: 7.8 g/dL (ref 6.5–8.1)
Total Bilirubin: 0.5 mg/dL (ref 0.3–1.2)

## 2016-05-09 LAB — CBC WITH DIFFERENTIAL/PLATELET
BASOS ABS: 0 10*3/uL (ref 0–0.1)
Basophils Relative: 1 %
EOS ABS: 0.2 10*3/uL (ref 0–0.7)
EOS PCT: 3 %
HCT: 38 % (ref 35.0–47.0)
Hemoglobin: 12.7 g/dL (ref 12.0–16.0)
LYMPHS PCT: 24 %
Lymphs Abs: 1.5 10*3/uL (ref 1.0–3.6)
MCH: 30.7 pg (ref 26.0–34.0)
MCHC: 33.4 g/dL (ref 32.0–36.0)
MCV: 91.8 fL (ref 80.0–100.0)
MONO ABS: 0.5 10*3/uL (ref 0.2–0.9)
Monocytes Relative: 8 %
Neutro Abs: 4 10*3/uL (ref 1.4–6.5)
Neutrophils Relative %: 64 %
PLATELETS: 150 10*3/uL (ref 150–440)
RBC: 4.14 MIL/uL (ref 3.80–5.20)
RDW: 16.6 % — AB (ref 11.5–14.5)
WBC: 6.3 10*3/uL (ref 3.6–11.0)

## 2016-05-09 MED ORDER — HEPARIN SOD (PORK) LOCK FLUSH 100 UNIT/ML IV SOLN
INTRAVENOUS | Status: AC
  Filled 2016-05-09: qty 5

## 2016-05-09 NOTE — Progress Notes (Signed)
rx for ibrance escribed to biologics.  [place] @ [facility] [phone]) [phone]  Fax:([phone] [place]: 01-Jun-1946  MR#: [id number]  JOA#:[id number]  Patient Care Team: Kathrine Haddock, NP as PCP - General (Nurse Practitioner) Robert Bellow, MD (General Surgery) Forest Gleason, MD (Oncology)  CHIEF COMPLAINT:  Chief Complaint  Patient presents with  . 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  Estrogen receptor 90%.  Progesterone receptor 60%.  Patient had lumpectomy and MammoSite partial breast radiation therapy followed by tamoxifen for 5 years.  Patient is still taking tamoxifen.. 2 recent mammogram in December of 2015 negative.  Further evaluation by surgeon revealed a palpable mass in the left upper outer quadrant near axilla.  Ultrasound revealed one small normal-appearing lymph node 0.6 cm in lower limit of axilla.  Adjacent to this there was ill-defined multilobulated hypoechoic mass 1.8 x 2.6 cm.  Biopsy of the left axillary mass was done which was positive for high-grade carcinoma.hormone receptors are pending  2,estrogen receptor positive.  Progesterone receptor positive.  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 3.  MRI scan of breast (January, 2015) reveals multiple abnormal enlarged left axillary lymph node There are 2 oval some circumscribed mass at 2:30 and 3:00 position of the left breast.  0.6 cm x 1.3 cm and 4 x 7 mm Non-mass enhancement extending 2.4 cm these masses is 3.6 cm mass.  Findings suggestive of several pulmonary nodules.  PET  scan shows extensive disease, 4.started on Taxotere,perjeta, Herceptin(January 03, 2015). 5.  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  Patient is excellent response to Taxotere per time and Herceptin now on Herceptin and anti-hormonal maintenance therapy No bony pain. Appetite is improved.  No nausea.  No vomiting.  No diarrhea. No shortness of breath or chest pain Patient has no bone pain appetite has been stable Patient is here for continuation of letrozole ,  ibrance  and Herceptin maintenance therapy for stage IV carcinoma breast Last MUGA scan of the heart was in March which was stable. No bone pains.  Appetite has been stable.  Patient is also due for next mammogram   REVIEW OF SYSTEMS:    general status: Patient is feeling weak and tired.  No change in a performance status.  No chills.  No fever. HEENT:   No evidence of stomatitis Lungs: No cough or shortness of breath Cardiac: No chest pain or paroxysmal nocturnal dyspnea GI: No nausea no vomiting no diarrhea no abdominal pain Skin: No rash Lower extremity no swelling Neurological system: No tingling.  No numbness.  No other focal signs Musculoskeletal system no bony pains  As per HPI. Otherwise, a complete review of systems is negatve.  PAST MEDICAL HISTORY: Past Medical History  Diagnosis Date  . Hypertension 2009  . 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 Special screening for malignant neoplasms, colon   . Hyperlipidemia 1990  . Floaters 2014  . Cancer [facility]) 2010    right breast ductal carcinoma in situ  . Malignant neoplasm of upper-outer quadrant of female breast [facility]) February 21, 2009    DCIS  of the right breast, intermediate grade, resected the negative margins. ER 90%, PR 50%, MammoSite partial breast radiation  . Breast cancer ([place]) 02/21/2009  . 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 Axillary mass 12/15/2014    PAST SURGICAL HISTORY: Past Surgical History  Procedure Laterality Date  . Appendectomy  1956  . Partial hysterectomy  1977  .  Colonoscopy  2008    ??? Md  . Breast surgery Right 2010    wide excision  . Breast biopsy Right 2010    stereo  . Left axilla biopsy Left 12-14-14    FAMILY HISTORY Family History  Problem Relation Age of Onset  . Cancer Other     unknown family members with ovarian,colon,breast cancers  . Hypertension Mother   . Heart disease Daughter     MI    ADVANCED DIRECTIVES:  Patient does have advance healthcare directive, Patient   does not desire to make any changes HEALTH MAINTENANCE: Social History  Substance Use Topics  . Smoking status: Never Smoker   . Smokeless tobacco: Never Used  . Alcohol Use: No      Allergies  Allergen Reactions  . Penicillin G     Other reaction(s): Pruritic rash  . Sulfa Antibiotics Rash    Other reaction(s): Weal    Current Outpatient Prescriptions  Medication Sig Dispense Refill  . ALPRAZolam (XANAX) 0.5 MG tablet TAKE 1 TABLET BY MOUTH 3 TIMES A DAY AS NEEDED 90 tablet 1  . amLODipine (NORVASC) 5 MG tablet Take 1 tablet (5 mg total) by mouth daily. 30 tablet 3  . aspirin 81 MG tablet Take 81 mg by mouth daily.    . Calcium 600-200 MG-UNIT tablet Take 1 tablet by mouth 2 (two) times daily. 120 tablet 3  . letrozole (FEMARA) 2.5 MG tablet Take 1 tablet (2.5 mg total) by mouth daily. 30 tablet 6  . metFORMIN (GLUCOPHAGE) 500 MG tablet TAKE 1 TABLET TWICE EACH DAY ORAL 60 tablet 5  . ONE TOUCH ULTRA TEST test strip     . palbociclib (IBRANCE) 125 MG capsule Take 1 capsule (125 mg total) by mouth daily with breakfast. Take whole with food for 21 days then 7 days off. 21 capsule 3  . potassium chloride SA (KLOR-CON M20) 20 MEQ tablet Take 2 tablets (40 mEq total) by mouth daily. 60 tablet 3  . rosuvastatin (CRESTOR) 5 MG tablet Take 1 tablet (5 mg total) by mouth daily. 90 tablet 1  . valsartan-hydrochlorothiazide (DIOVAN-HCT) 320-25 MG tablet TAKE 1 TABLET BY MOUTH DAILY 30 tablet 6   No current facility-administered medications for this  visit.   Facility-Administered Medications Ordered in Other Visits  Medication Dose Route Frequency Provider Last Rate Last Dose  . heparin lock flush 100 unit/mL  250 Units Intracatheter PRN Forest Gleason, MD      . sodium chloride 0.9 % 250 mL with potassium chloride 40 mEq infusion   Intravenous Continuous Forest Gleason, MD   Stopped at 05/03/15 1229  . sodium chloride 0.9 % injection 10 mL  10 mL Intracatheter PRN Forest Gleason, MD   10 mL at 04/18/15 1545    OBJECTIVE:  Filed Vitals:   05/09/16 1357  BP: 151/85  Pulse: 89  Temp: 97 F (36.1 C)     Body mass index is 25.25 kg/(m^2).    ECOG FS:1 - Symptomatic but completely ambulatory  PHYSICAL EXAM: gENERAL  status: Performance status is good.  Patient has not  lost significant weight HEENT: No evidence of stomatitis. Sclera and conjunctivae :: No jaundice.   pale looking. Lungs: Air  entry equal on both sides.  No rhonchi.  No rales.  Cardiac: Heart sounds are normal.  No pericardial rub.  No murmur. Small subcutaneous nodule on the right side of the throat (chronic) Lymphatic system: Palpable lymph node in the axillary area. GI: Abdomen is soft.  No ascites.  Liver spleen not palpable.  No tenderness.  Bowel sounds are within normal limit Lower extremity: No edema Neurological system: Higher functions, cranial nerves intact no evidence of peripheral neuropathy. Skin: No rash.  No ecchymosis.. Right breast small palpable mass in the outer lower quadrant unchanged.  Right axillary area no palpable mass.    LEFT BREAST .  FREE  of masses  LAB RESULTS:  Clinical Support on 05/09/2016  Component Date Value Ref Range Status  . WBC 05/09/2016 6.3  3.6 - 11.0 K/uL Final  . RBC 05/09/2016 4.14  3.80 - 5.20 MIL/uL Final  . Hemoglobin 05/09/2016 12.7  12.0 - 16.0 g/dL Final  . HCT 05/09/2016 38.0  35.0 - 47.0 % Final  . MCV 05/09/2016 91.8  80.0 - 100.0 fL Final  . MCH 05/09/2016 30.7  26.0 - 34.0 pg Final  . MCHC 05/09/2016 33.4   32.0 - 36.0 g/dL Final  . RDW 05/09/2016 16.6* 11.5 - 14.5 % Final  . Platelets 05/09/2016 150  150 - 440 K/uL Final  . Neutrophils Relative % 05/09/2016 64   Final  . Neutro Abs 05/09/2016 4.0  1.4 - 6.5 K/uL Final  . Lymphocytes Relative 05/09/2016 24   Final  . Lymphs Abs 05/09/2016 1.5  1.0 - 3.6 K/uL Final  . Monocytes Relative 05/09/2016 8   Final  . Monocytes Absolute 05/09/2016 0.5  0.2 - 0.9 K/uL Final  . Eosinophils Relative 05/09/2016 3   Final  . Eosinophils Absolute 05/09/2016 0.2  0 - 0.7 K/uL Final  . Basophils Relative 05/09/2016 1   Final  . Basophils Absolute 05/09/2016 0.0  0 - 0.1 K/uL Final  . Sodium 05/09/2016 138  135 - 145 mmol/L Final  . Potassium 05/09/2016 3.7  3.5 - 5.1 mmol/L Final  . Chloride 05/09/2016 104  101 - 111 mmol/L Final  . CO2 05/09/2016 28  22 - 32 mmol/L Final  . Glucose, Bld 05/09/2016 254* 65 - 99 mg/dL Final  . BUN 05/09/2016 11  6 - 20 mg/dL Final  . Creatinine, Ser 05/09/2016 0.73  0.44 - 1.00 mg/dL Final  . Calcium 05/09/2016 9.1  8.9 - 10.3 mg/dL Final  . Total Protein 05/09/2016 7.8  6.5 - 8.1 g/dL Final  . Albumin 05/09/2016 4.4  3.5 - 5.0 g/dL Final  . AST 05/09/2016 30  15 - 41 U/L Final  . ALT 05/09/2016 25  14 - 54 U/L Final  . Alkaline Phosphatase 05/09/2016 65  38 - 126 U/L Final  . Total Bilirubin 05/09/2016 0.5  0.3 - 1.2 mg/dL Final  . GFR calc non Af Amer 05/09/2016 >60  >60 mL/min Final  . GFR calc Af Amer 05/09/2016 >60  >60 mL/min Final   Comment: (NOTE) The eGFR has been calculated using the CKD EPI equation. This calculation has not been validated in all clinical situations. eGFR's persistently <60 mL/min signify possible Chronic Kidney Disease.   . Anion gap 05/09/2016 6  5 - 15 Final   Last MUGA scan was in November of 2016. Normal left ventricular ejection  fraction.  58% slightly decreased from the previous 63%.     IMPRESSION: 1. Interval progression of disease. 2. There has been increase in size and  FDG uptake associated with the left axillary and left subpectoral lymph nodes. 3. Increase in FDG uptake associated with hypermetabolic sacral metastases. 4. Mild increase in FDG uptake associated with nonspecific activity within bilateral hilar regions.   Electronically Signed  By: Kerby Moors M.D.  On: 05/07/2016 12:14     ASSESSMENT: STAGE 4  carcinoma of breast good response to chemotherapy on maintenance Herceptin therapy      MEDICAL DECISION MAKING:   All lab data has been reviewed.  Reassessment of the tumor with tumor marker as well as with PET scan prior to next treatment.  Patient has a progressing disease based on PET scan.  Tumor markers are pending. PET scan reviewed independently sews multiple bone metastases  Take the patient off Herceptin as well as IBRANCE and letrozole Recheck tumor marker seeds arising consider TDM 1  Other options would be letrozole and Afinitor however patient has progressed on letrozole at present time faslodex   alone may be an option Chemotherapy wise   ERIBULINN   may be an option    Breast cancer   Staging form: Breast, AJCC 7th Edition     Clinical stage from 04/06/2015: Stage IV (T2, N2, M1) - Signed by Evlyn Kanner, NP on 04/06/2015   Forest Gleason, MD   05/09/2016 2:07 PM

## 2016-05-11 ENCOUNTER — Telehealth: Payer: Self-pay | Admitting: *Deleted

## 2016-05-11 NOTE — Telephone Encounter (Signed)
Spoke with Arts development officer to order refill for ibrance 125mg  capsules. Order will be processed and will take up to 2 business days to be delivered to patient.

## 2016-05-14 ENCOUNTER — Encounter: Payer: Self-pay | Admitting: Oncology

## 2016-06-05 ENCOUNTER — Other Ambulatory Visit: Payer: Self-pay | Admitting: *Deleted

## 2016-06-05 DIAGNOSIS — C50919 Malignant neoplasm of unspecified site of unspecified female breast: Secondary | ICD-10-CM

## 2016-06-07 ENCOUNTER — Ambulatory Visit: Payer: Medicare Other | Admitting: Internal Medicine

## 2016-06-07 ENCOUNTER — Other Ambulatory Visit: Payer: Medicare Other

## 2016-06-08 ENCOUNTER — Inpatient Hospital Stay: Payer: Medicare Other

## 2016-06-08 ENCOUNTER — Inpatient Hospital Stay: Payer: Medicare Other | Attending: Internal Medicine

## 2016-06-08 ENCOUNTER — Inpatient Hospital Stay (HOSPITAL_BASED_OUTPATIENT_CLINIC_OR_DEPARTMENT_OTHER): Payer: Medicare Other | Admitting: Internal Medicine

## 2016-06-08 VITALS — BP 173/90 | HR 86 | Temp 97.4°F | Resp 18 | Wt 140.4 lb

## 2016-06-08 DIAGNOSIS — Z170 Estrogen receptor positive status [ER+]: Secondary | ICD-10-CM

## 2016-06-08 DIAGNOSIS — Z8041 Family history of malignant neoplasm of ovary: Secondary | ICD-10-CM | POA: Insufficient documentation

## 2016-06-08 DIAGNOSIS — C50612 Malignant neoplasm of axillary tail of left female breast: Secondary | ICD-10-CM

## 2016-06-08 DIAGNOSIS — C50412 Malignant neoplasm of upper-outer quadrant of left female breast: Secondary | ICD-10-CM

## 2016-06-08 DIAGNOSIS — Z79899 Other long term (current) drug therapy: Secondary | ICD-10-CM | POA: Insufficient documentation

## 2016-06-08 DIAGNOSIS — Z5112 Encounter for antineoplastic immunotherapy: Secondary | ICD-10-CM | POA: Insufficient documentation

## 2016-06-08 DIAGNOSIS — Z95828 Presence of other vascular implants and grafts: Secondary | ICD-10-CM

## 2016-06-08 DIAGNOSIS — C787 Secondary malignant neoplasm of liver and intrahepatic bile duct: Secondary | ICD-10-CM | POA: Diagnosis not present

## 2016-06-08 DIAGNOSIS — E785 Hyperlipidemia, unspecified: Secondary | ICD-10-CM

## 2016-06-08 DIAGNOSIS — C7951 Secondary malignant neoplasm of bone: Secondary | ICD-10-CM

## 2016-06-08 DIAGNOSIS — C50611 Malignant neoplasm of axillary tail of right female breast: Secondary | ICD-10-CM

## 2016-06-08 DIAGNOSIS — Z7984 Long term (current) use of oral hypoglycemic drugs: Secondary | ICD-10-CM | POA: Diagnosis not present

## 2016-06-08 DIAGNOSIS — C50919 Malignant neoplasm of unspecified site of unspecified female breast: Secondary | ICD-10-CM

## 2016-06-08 DIAGNOSIS — Z800 Family history of malignant neoplasm of digestive organs: Secondary | ICD-10-CM | POA: Insufficient documentation

## 2016-06-08 DIAGNOSIS — Z803 Family history of malignant neoplasm of breast: Secondary | ICD-10-CM

## 2016-06-08 DIAGNOSIS — Z7982 Long term (current) use of aspirin: Secondary | ICD-10-CM | POA: Diagnosis not present

## 2016-06-08 DIAGNOSIS — Z853 Personal history of malignant neoplasm of breast: Secondary | ICD-10-CM | POA: Diagnosis not present

## 2016-06-08 LAB — COMPREHENSIVE METABOLIC PANEL
ALBUMIN: 4.4 g/dL (ref 3.5–5.0)
ALT: 26 U/L (ref 14–54)
ANION GAP: 7 (ref 5–15)
AST: 31 U/L (ref 15–41)
Alkaline Phosphatase: 65 U/L (ref 38–126)
BILIRUBIN TOTAL: 0.6 mg/dL (ref 0.3–1.2)
BUN: 13 mg/dL (ref 6–20)
CO2: 28 mmol/L (ref 22–32)
Calcium: 9.2 mg/dL (ref 8.9–10.3)
Chloride: 105 mmol/L (ref 101–111)
Creatinine, Ser: 0.72 mg/dL (ref 0.44–1.00)
GFR calc Af Amer: >60 mL/min (ref >60)
GFR calc non Af Amer: >60 mL/min (ref >60)
GLUCOSE: 151 mg/dL — AB (ref 65–99)
POTASSIUM: 3.7 mmol/L (ref 3.5–5.1)
SODIUM: 140 mmol/L (ref 135–145)
TOTAL PROTEIN: 7.8 g/dL (ref 6.5–8.1)

## 2016-06-08 LAB — CBC WITH DIFFERENTIAL/PLATELET
BASOS ABS: 0 10*3/uL (ref 0–0.1)
BASOS PCT: 1 %
EOS ABS: 0.1 10*3/uL (ref 0–0.7)
Eosinophils Relative: 2 %
HEMATOCRIT: 37.2 % (ref 35.0–47.0)
HEMOGLOBIN: 12.6 g/dL (ref 12.0–16.0)
Lymphocytes Relative: 29 %
Lymphs Abs: 1.6 10*3/uL (ref 1.0–3.6)
MCH: 31.9 pg (ref 26.0–34.0)
MCHC: 33.9 g/dL (ref 32.0–36.0)
MCV: 94 fL (ref 80.0–100.0)
MONO ABS: 0.3 10*3/uL (ref 0.2–0.9)
Monocytes Relative: 5 %
NEUTROS ABS: 3.5 10*3/uL (ref 1.4–6.5)
NEUTROS PCT: 63 %
Platelets: 140 10*3/uL — ABNORMAL LOW (ref 150–440)
RBC: 3.96 MIL/uL (ref 3.80–5.20)
RDW: 16.9 % — AB (ref 11.5–14.5)
WBC: 5.6 10*3/uL (ref 3.6–11.0)

## 2016-06-08 MED ORDER — SODIUM CHLORIDE 0.9% FLUSH
10.0000 mL | INTRAVENOUS | Status: DC | PRN
  Administered 2016-06-08: 10 mL via INTRAVENOUS
  Filled 2016-06-08: qty 10

## 2016-06-08 MED ORDER — DENOSUMAB 120 MG/1.7ML ~~LOC~~ SOLN
120.0000 mg | Freq: Once | SUBCUTANEOUS | Status: AC
Start: 2016-06-08 — End: 2016-06-08
  Administered 2016-06-08: 120 mg via SUBCUTANEOUS

## 2016-06-08 MED ORDER — HEPARIN SOD (PORK) LOCK FLUSH 100 UNIT/ML IV SOLN
500.0000 [IU] | Freq: Once | INTRAVENOUS | Status: AC
  Administered 2016-06-08: 500 [IU] via INTRAVENOUS

## 2016-06-08 NOTE — Addendum Note (Signed)
Addended by: Vanice Sarah on: 06/08/2016 04:19 PM   Modules accepted: Orders

## 2016-06-08 NOTE — Assessment & Plan Note (Addendum)
Metastatic breast cancer to the bone/lung- currently on ibrance plus letrozole. Progression noted- most recent PET scan in May 2017. I'm awaiting for tumor marker from today. Patient is asymptomatic.  # recommend starting the patient on kadcyla every 3 weeks. Discussed the potential side effects of low platelet count; but overall should be well tolerated. Start treatment next week.  # bone metastases- continue Xgeva; since patient is asymptomatic from her bony metastases- I would recommend switching to every 6 weeks  # patient seems to be in denial/husband states that a of the stage of cancer. I reviewed the patient has stage IV cancer/which is incurable- however treatable.  I reviewed the images myself and with the patient and her husband in detail.  # start IV treatment next week; follow-up with me in approximately 4 weeks with a second infusion. Labs.

## 2016-06-08 NOTE — Progress Notes (Signed)
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  Estrogen receptor 90%.  Progesterone receptor 60%.  Patient had lumpectomy and MammoSite partial breast radiation therapy followed by tamoxifen for 5 years.  Patient is still taking tamoxifen.. 2 recent mammogram in December of 2015 negative.  Further evaluation by surgeon revealed a palpable mass in the left upper outer quadrant near axilla.  Ultrasound revealed one small normal-appearing lymph node 0.6 cm in lower limit of axilla.  Adjacent to this there was ill-defined multilobulated hypoechoic mass 1.8 x 2.6 cm.  Biopsy of the left axillary mass was done which was positive for high-grade carcinoma.hormone receptors are pending  2,estrogen receptor positive.  Progesterone receptor positive.  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 3.  MRI scan of breast (January, 2015) reveals multiple abnormal enlarged left axillary lymph node There are 2 oval some circumscribed mass at 2:30 and 3:00 position of the left breast.  0.6 cm x 1.3 cm and 4 x 7 mm Non-mass enhancement extending 2.4 cm these masses is 3.6 cm mass.  Findings suggestive of several pulmonary nodules.  PET  scan shows extensive disease, 4.started on Taxotere,perjeta, Herceptin(January 03, 2015). 5.  Patient is on maintenance Herceptin that has been started on letrozole   And [facility] of 2016 [facility] 2017] MAY 2017- IMPRESSION: 1. Interval progression of disease. 2. There has been increase in size and FDG uptake associated  with the left axillary and left subpectoral lymph nodes. 3. Increase in FDG uptake associated with hypermetabolic sacral metastases. 4. Mild increase in FDG uptake associated with nonspecific activity within bilateral hilar regions.        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  Patient denies any nausea vomiting. Denies any bone pain. Denies any worsening swelling in in the legs. Her appetite is good. No weight loss. No shortness of breath. No chest pain. Denies any tingling or numbness. No fevers or chills.  Interestingly patient/husband denied the knowledge of the cancer being metastatic.    REVIEW OF SYSTEMS:  A complete 10 point review of system is done which is negative except mentioned above/history of present illness.   PAST MEDICAL HISTORY :  Past Medical History  Diagnosis Date  . Hypertension 2009  . 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 Special screening for malignant neoplasms, colon   . Hyperlipidemia 1990  . Floaters 2014  . Cancer [facility]) 2010    right breast ductal carcinoma in situ  . 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 ER 90%, PR 50%, MammoSite partial breast radiation  . Breast cancer ([place]) 02/21/2009  . 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 Axillary mass 12/15/2014    PAST SURGICAL HISTORY :   Past Surgical History   Procedure Laterality Date  . Appendectomy  1956  . Partial hysterectomy  1977  . Colonoscopy  2008    ??? Md  . Breast surgery Right 2010    wide excision  . Breast biopsy Right 2010    stereo  . Left axilla biopsy Left 12-14-14    FAMILY HISTORY :   Family History  Problem Relation Age of Onset  . Cancer Other     unknown family members with ovarian,colon,breast cancers  . Hypertension Mother   . Heart disease Daughter     MI    SOCIAL HISTORY:   Social History  Substance Use Topics  . Smoking status: Never Smoker   . Smokeless tobacco: Never Used  . Alcohol Use: No    ALLERGIES:  is allergic to penicillin g and sulfa antibiotics.  MEDICATIONS:  Current Outpatient Prescriptions  Medication Sig Dispense Refill  . ALPRAZolam (XANAX) 0.5 MG tablet TAKE 1 TABLET BY MOUTH 3 TIMES A DAY AS NEEDED 90 tablet 1  . amLODipine (NORVASC) 5 MG tablet Take 1 tablet (5 mg total) by mouth daily. 30 tablet 3  . aspirin 81 MG tablet Take 81 mg by mouth daily.    . Calcium 600-200 MG-UNIT tablet Take 1 tablet by mouth 2 (two) times daily. 120 tablet 3  . letrozole (FEMARA) 2.5 MG tablet Take 1 tablet (2.5 mg total) by mouth daily. 30 tablet 6  . metFORMIN (GLUCOPHAGE) 500 MG tablet TAKE 1 TABLET TWICE EACH DAY ORAL 60 tablet 5  . ONE TOUCH ULTRA TEST test strip     . palbociclib (IBRANCE) 125 MG capsule Take 1 capsule (125 mg total) by mouth daily with breakfast. Take whole with food for 21 days then 7 days off. 21 capsule 3  . potassium chloride SA (KLOR-CON M20) 20 MEQ tablet Take 2 tablets (40 mEq total) by mouth daily. 60 tablet 3  . rosuvastatin (CRESTOR) 5 MG tablet Take 1 tablet (5 mg total) by mouth daily. 90 tablet 1  . valsartan-hydrochlorothiazide (DIOVAN-HCT) 320-25 MG tablet TAKE 1 TABLET BY MOUTH DAILY 30 tablet 6   No current facility-administered medications for this visit.   Facility-Administered Medications Ordered in Other Visits  Medication Dose Route Frequency  Provider Last Rate Last Dose  . heparin lock flush 100 unit/mL  250 Units Intracatheter PRN Forest Gleason, MD      . sodium chloride 0.9 % 250 mL with potassium chloride 40 mEq infusion   Intravenous Continuous Forest Gleason, MD   Stopped at 05/03/15 1229  . sodium chloride 0.9 % injection 10 mL  10 mL Intracatheter PRN Forest Gleason, MD   10 mL at 04/18/15 1545    PHYSICAL EXAMINATION: ECOG PERFORMANCE STATUS: 0 - Asymptomatic  BP 173/90 mmHg  Pulse 86  Temp(Src) 97.4 F (36.3 C) (Tympanic)  Resp 18  Wt 140 lb 6.9 oz (63.7 kg)  LMP  (LMP Unknown)  Filed Weights   06/08/16 1432  Weight: 140 lb 6.9 oz (63.7 kg)    GENERAL: Well-nourished well-developed; Alert, no distress and comfortable.   Accompanied by her husband. EYES: no pallor or icterus OROPHARYNX: no thrush or ulceration; poor dentition  NECK:  supple, no masses felt LYMPH:  no palpable lymphadenopathy in the cervical, axillary or inguinal regions LUNGS: clear to auscultation and  No wheeze or crackles HEART/CVS: regular rate & rhythm and no murmurs; No lower extremity edema ABDOMEN:abdomen soft, non-tender and normal bowel sounds Musculoskeletal:no cyanosis of digits and no clubbing  PSYCH: alert & oriented x 3 with fluent speech NEURO: no focal motor/sensory deficits SKIN:  no rashes or significant lesions LEFT BREAST- 2-3 cm mass left upper outer quadrant/axillary tail. Nontender.  LABORATORY DATA:  I have reviewed the data as listed    Component Value Date/Time   NA 140 06/08/2016 1315   NA 136 09/16/2015 0843   NA 138 03/28/2015 0923   K 3.7 06/08/2016 1315   K 3.3* 03/28/2015 0923   CL 105 06/08/2016 1315   CL 101 03/28/2015 0923   CO2 28 06/08/2016 1315   CO2 28 03/28/2015 0923   GLUCOSE 151* 06/08/2016 1315   GLUCOSE 145* 09/16/2015 0843   GLUCOSE 173* 03/28/2015 0923   BUN 13 06/08/2016 1315   BUN 10 09/16/2015 0843   BUN 9 03/28/2015 0923   CREATININE 0.72 06/08/2016 1315   CREATININE 0.56  03/28/2015 0923   CALCIUM 9.2 06/08/2016 1315   CALCIUM 8.6* 03/28/2015 0923   PROT 7.8 06/08/2016 1315   PROT 7.2 09/16/2015 0843   PROT 7.1 03/28/2015 0923   ALBUMIN 4.4 06/08/2016 1315   ALBUMIN 4.6 09/16/2015 0843   ALBUMIN 3.6 03/28/2015 0923   AST 31 06/08/2016 1315   AST 43* 03/28/2015 0923   ALT 26 06/08/2016 1315   ALT 26 03/28/2015 0923   ALKPHOS 65 06/08/2016 1315   ALKPHOS 85 03/28/2015 0923   BILITOT 0.6 06/08/2016 1315   BILITOT 0.9 09/16/2015 0843   BILITOT 0.6 03/28/2015 0923   GFRNONAA >60 06/08/2016 1315   GFRNONAA >60 03/28/2015 0923   GFRNONAA >60 01/20/2015 0920   GFRAA >60 06/08/2016 1315   GFRAA >60 03/28/2015 0923   GFRAA >60 01/20/2015 0920    No results found for: SPEP, UPEP  Lab Results  Component Value Date   WBC 5.6 06/08/2016   NEUTROABS 3.5 06/08/2016   HGB 12.6 06/08/2016   HCT 37.2 06/08/2016   MCV 94.0 06/08/2016   PLT 140* 06/08/2016      Chemistry      Component Value Date/Time   NA 140 06/08/2016 1315   NA 136 09/16/2015 0843   NA 138 03/28/2015 0923   K 3.7 06/08/2016 1315   K 3.3* 03/28/2015 0923   CL 105 06/08/2016 1315   CL 101 03/28/2015 0923   CO2 28 06/08/2016 1315   CO2 28 03/28/2015 0923   BUN 13 06/08/2016 1315   BUN 10 09/16/2015 0843   BUN 9 03/28/2015 0923   CREATININE 0.72 06/08/2016 1315   CREATININE 0.56 03/28/2015 0923      Component Value Date/Time   CALCIUM 9.2 06/08/2016 1315   CALCIUM 8.6* 03/28/2015 0923   ALKPHOS 65 06/08/2016 1315   ALKPHOS 85 03/28/2015 0923   AST 31 06/08/2016 1315   AST 43* 03/28/2015 0923   ALT 26 06/08/2016 1315   ALT 26 03/28/2015 0923   BILITOT 0.6 06/08/2016 1315   BILITOT 0.9 09/16/2015 0843   BILITOT 0.6 03/28/2015 0923       RADIOGRAPHIC STUDIES: I have personally reviewed the radiological images as listed and agreed with the findings in the report. No results found.   ASSESSMENT & PLAN:  Malignant neoplasm of axillary tail  of female breast  [facility]) Metastatic breast cancer to the bone/lung- currently on ibrance plus letrozole. Progression noted- most recent PET scan in May 2017. I'm awaiting for tumor marker from today. Patient is asymptomatic.  # recommend starting the patient on kadcyla every 3 weeks. Discussed the potential side effects of low platelet count; but overall should be well tolerated. Start treatment next week.  # bone metastases- continue Xgeva; since patient is asymptomatic from her bony metastases- I would recommend switching to every 6 weeks  # patient seems to be in denial/husband states that a of the stage of cancer. I reviewed the patient has stage IV cancer/which is incurable- however treatable.  I reviewed the images myself and with the patient and her husband in detail.  # start IV treatment next week; follow-up with me in approximately 4 weeks with a second infusion. Labs.   No orders of the defined types were placed in this encounter.   All questions were answered. The patient knows to call the clinic with any problems, questions or concerns.      Cammie Sickle, MD 06/08/2016 3:36 PM

## 2016-06-10 LAB — CA 27.29 (SERIAL MONITOR): CA 27.29: 37 U/mL (ref 0.0–38.6)

## 2016-06-12 ENCOUNTER — Other Ambulatory Visit: Payer: Self-pay | Admitting: Unknown Physician Specialty

## 2016-06-15 ENCOUNTER — Inpatient Hospital Stay: Payer: Medicare Other

## 2016-06-15 VITALS — BP 128/77 | HR 76 | Temp 97.4°F | Resp 18

## 2016-06-15 DIAGNOSIS — C50612 Malignant neoplasm of axillary tail of left female breast: Secondary | ICD-10-CM

## 2016-06-15 DIAGNOSIS — C50422 Malignant neoplasm of upper-outer quadrant of left male breast: Secondary | ICD-10-CM

## 2016-06-15 DIAGNOSIS — Z5112 Encounter for antineoplastic immunotherapy: Secondary | ICD-10-CM | POA: Diagnosis not present

## 2016-06-15 MED ORDER — ACETAMINOPHEN 325 MG PO TABS
650.0000 mg | ORAL_TABLET | Freq: Once | ORAL | Status: AC
  Administered 2016-06-15: 650 mg via ORAL
  Filled 2016-06-15: qty 2

## 2016-06-15 MED ORDER — SODIUM CHLORIDE 0.9 % IV SOLN
3.6000 mg/kg | Freq: Once | INTRAVENOUS | Status: DC
  Filled 2016-06-15: qty 11

## 2016-06-15 MED ORDER — DIPHENHYDRAMINE HCL 25 MG PO CAPS
ORAL_CAPSULE | ORAL | Status: AC
  Filled 2016-06-15: qty 1

## 2016-06-15 MED ORDER — SODIUM CHLORIDE 0.9 % IV SOLN
200.0000 mg | Freq: Once | INTRAVENOUS | Status: AC
  Administered 2016-06-15: 200 mg via INTRAVENOUS
  Filled 2016-06-15: qty 10

## 2016-06-15 MED ORDER — SODIUM CHLORIDE 0.9 % IV SOLN
200.0000 mg | Freq: Once | INTRAVENOUS | Status: DC
  Filled 2016-06-15: qty 10

## 2016-06-15 MED ORDER — SODIUM CHLORIDE 0.9 % IV SOLN
Freq: Once | INTRAVENOUS | Status: AC
  Administered 2016-06-15: 10:00:00 via INTRAVENOUS
  Filled 2016-06-15: qty 1000

## 2016-06-15 MED ORDER — HEPARIN SOD (PORK) LOCK FLUSH 100 UNIT/ML IV SOLN
500.0000 [IU] | Freq: Once | INTRAVENOUS | Status: AC | PRN
Start: 2016-06-15 — End: 2016-06-15
  Administered 2016-06-15: 500 [IU]
  Filled 2016-06-15: qty 5

## 2016-06-15 MED ORDER — SODIUM CHLORIDE 0.9% FLUSH
10.0000 mL | INTRAVENOUS | Status: DC | PRN
  Administered 2016-06-15: 10 mL
  Filled 2016-06-15: qty 10

## 2016-06-15 MED ORDER — DIPHENHYDRAMINE HCL 25 MG PO CAPS
50.0000 mg | ORAL_CAPSULE | Freq: Once | ORAL | Status: AC
  Administered 2016-06-15: 50 mg via ORAL
  Filled 2016-06-15: qty 2

## 2016-06-22 ENCOUNTER — Ambulatory Visit: Payer: Medicare Other

## 2016-06-29 ENCOUNTER — Ambulatory Visit: Payer: Medicare Other

## 2016-07-04 ENCOUNTER — Other Ambulatory Visit: Payer: Self-pay | Admitting: Unknown Physician Specialty

## 2016-07-06 ENCOUNTER — Encounter: Payer: Self-pay | Admitting: Hematology and Oncology

## 2016-07-06 ENCOUNTER — Inpatient Hospital Stay: Payer: Medicare Other

## 2016-07-06 ENCOUNTER — Inpatient Hospital Stay: Payer: Medicare Other | Attending: Internal Medicine | Admitting: Hematology and Oncology

## 2016-07-06 ENCOUNTER — Ambulatory Visit: Payer: Medicare Other | Admitting: Oncology

## 2016-07-06 ENCOUNTER — Other Ambulatory Visit: Payer: Self-pay | Admitting: Hematology and Oncology

## 2016-07-06 VITALS — BP 163/88 | HR 105 | Resp 18 | Wt 141.6 lb

## 2016-07-06 DIAGNOSIS — C50612 Malignant neoplasm of axillary tail of left female breast: Secondary | ICD-10-CM | POA: Diagnosis not present

## 2016-07-06 DIAGNOSIS — E876 Hypokalemia: Secondary | ICD-10-CM | POA: Diagnosis not present

## 2016-07-06 DIAGNOSIS — C7800 Secondary malignant neoplasm of unspecified lung: Secondary | ICD-10-CM | POA: Diagnosis not present

## 2016-07-06 DIAGNOSIS — C50611 Malignant neoplasm of axillary tail of right female breast: Secondary | ICD-10-CM

## 2016-07-06 DIAGNOSIS — Z170 Estrogen receptor positive status [ER+]: Secondary | ICD-10-CM

## 2016-07-06 DIAGNOSIS — Z9221 Personal history of antineoplastic chemotherapy: Secondary | ICD-10-CM | POA: Insufficient documentation

## 2016-07-06 DIAGNOSIS — E785 Hyperlipidemia, unspecified: Secondary | ICD-10-CM | POA: Insufficient documentation

## 2016-07-06 DIAGNOSIS — Z853 Personal history of malignant neoplasm of breast: Secondary | ICD-10-CM

## 2016-07-06 DIAGNOSIS — C50422 Malignant neoplasm of upper-outer quadrant of left male breast: Secondary | ICD-10-CM

## 2016-07-06 DIAGNOSIS — Z5112 Encounter for antineoplastic immunotherapy: Secondary | ICD-10-CM | POA: Insufficient documentation

## 2016-07-06 DIAGNOSIS — Z7984 Long term (current) use of oral hypoglycemic drugs: Secondary | ICD-10-CM | POA: Diagnosis not present

## 2016-07-06 DIAGNOSIS — Z79811 Long term (current) use of aromatase inhibitors: Secondary | ICD-10-CM | POA: Insufficient documentation

## 2016-07-06 DIAGNOSIS — Z79899 Other long term (current) drug therapy: Secondary | ICD-10-CM | POA: Diagnosis not present

## 2016-07-06 DIAGNOSIS — Z7982 Long term (current) use of aspirin: Secondary | ICD-10-CM | POA: Diagnosis not present

## 2016-07-06 DIAGNOSIS — C7951 Secondary malignant neoplasm of bone: Secondary | ICD-10-CM | POA: Insufficient documentation

## 2016-07-06 DIAGNOSIS — I10 Essential (primary) hypertension: Secondary | ICD-10-CM

## 2016-07-06 LAB — COMPREHENSIVE METABOLIC PANEL
ALK PHOS: 89 U/L (ref 38–126)
ALT: 26 U/L (ref 14–54)
ANION GAP: 9 (ref 5–15)
AST: 39 U/L (ref 15–41)
Albumin: 4.2 g/dL (ref 3.5–5.0)
BILIRUBIN TOTAL: 0.8 mg/dL (ref 0.3–1.2)
BUN: 10 mg/dL (ref 6–20)
CALCIUM: 9.2 mg/dL (ref 8.9–10.3)
CO2: 26 mmol/L (ref 22–32)
Chloride: 103 mmol/L (ref 101–111)
Creatinine, Ser: 0.71 mg/dL (ref 0.44–1.00)
GFR calc Af Amer: >60 mL/min (ref >60)
GFR calc non Af Amer: >60 mL/min (ref >60)
GLUCOSE: 211 mg/dL — AB (ref 65–99)
POTASSIUM: 3.4 mmol/L — AB (ref 3.5–5.1)
Sodium: 138 mmol/L (ref 135–145)
TOTAL PROTEIN: 8.2 g/dL — AB (ref 6.5–8.1)

## 2016-07-06 LAB — CBC WITH DIFFERENTIAL/PLATELET
Basophils Absolute: 0.1 10*3/uL (ref 0–0.1)
Basophils Relative: 1 %
Eosinophils Absolute: 0.4 10*3/uL (ref 0–0.7)
Eosinophils Relative: 5 %
HCT: 37.9 % (ref 35.0–47.0)
Hemoglobin: 12.9 g/dL (ref 12.0–16.0)
Lymphocytes Relative: 22 %
Lymphs Abs: 1.8 10*3/uL (ref 1.0–3.6)
MCH: 31.2 pg (ref 26.0–34.0)
MCHC: 34.1 g/dL (ref 32.0–36.0)
MCV: 91.6 fL (ref 80.0–100.0)
Monocytes Absolute: 0.6 10*3/uL (ref 0.2–0.9)
Monocytes Relative: 7 %
Neutro Abs: 5.6 10*3/uL (ref 1.4–6.5)
Neutrophils Relative %: 65 %
Platelets: 172 10*3/uL (ref 150–440)
RBC: 4.13 MIL/uL (ref 3.80–5.20)
RDW: 15.5 % — ABNORMAL HIGH (ref 11.5–14.5)
WBC: 8.5 10*3/uL (ref 3.6–11.0)

## 2016-07-06 MED ORDER — SODIUM CHLORIDE 0.9% FLUSH
10.0000 mL | INTRAVENOUS | Status: DC | PRN
  Administered 2016-07-06: 10 mL
  Filled 2016-07-06: qty 10

## 2016-07-06 MED ORDER — SODIUM CHLORIDE 0.9 % IV SOLN
200.0000 mg | Freq: Once | INTRAVENOUS | Status: AC
  Administered 2016-07-06: 200 mg via INTRAVENOUS
  Filled 2016-07-06: qty 10

## 2016-07-06 MED ORDER — DIPHENHYDRAMINE HCL 25 MG PO CAPS
50.0000 mg | ORAL_CAPSULE | Freq: Once | ORAL | Status: AC
  Administered 2016-07-06: 50 mg via ORAL
  Filled 2016-07-06: qty 2

## 2016-07-06 MED ORDER — SODIUM CHLORIDE 0.9 % IV SOLN
Freq: Once | INTRAVENOUS | Status: AC
  Administered 2016-07-06: 11:00:00 via INTRAVENOUS
  Filled 2016-07-06: qty 1000

## 2016-07-06 MED ORDER — ACETAMINOPHEN 325 MG PO TABS
650.0000 mg | ORAL_TABLET | Freq: Once | ORAL | Status: AC
  Administered 2016-07-06: 650 mg via ORAL
  Filled 2016-07-06: qty 2

## 2016-07-06 MED ORDER — DENOSUMAB 120 MG/1.7ML ~~LOC~~ SOLN
120.0000 mg | Freq: Once | SUBCUTANEOUS | Status: AC
  Administered 2016-07-06: 120 mg via SUBCUTANEOUS
  Filled 2016-07-06: qty 1.7

## 2016-07-06 MED ORDER — HEPARIN SOD (PORK) LOCK FLUSH 100 UNIT/ML IV SOLN
500.0000 [IU] | Freq: Once | INTRAVENOUS | Status: AC | PRN
  Administered 2016-07-06: 500 [IU]
  Filled 2016-07-06: qty 5

## 2016-07-06 NOTE — Progress Notes (Signed)
Patient is here today, we went over her medications and has not been taking her Femara and Ibrance. She states she was taken off of them for treatment. She is worrying about her medication wanting to make sure she is doing right.

## 2016-07-06 NOTE — Progress Notes (Signed)
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  Estrogen receptor 90%.  Progesterone receptor 60%.  Patient had lumpectomy and MammoSite partial breast radiation therapy followed by tamoxifen for 5 years.  Patient is still taking tamoxifen.. 2 recent mammogram in December of 2015 negative.  Further evaluation by surgeon revealed a palpable mass in the left upper outer quadrant near axilla.  Ultrasound revealed one small normal-appearing lymph node 0.6 cm in lower limit of axilla.  Adjacent to this there was ill-defined multilobulated hypoechoic mass 1.8 x 2.6 cm.  Biopsy of the left axillary mass was done which was positive for high-grade carcinoma.hormone receptors are pending  2,estrogen receptor positive.  Progesterone receptor positive.  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 3.  MRI scan of breast (January, 2015) reveals multiple abnormal enlarged left axillary lymph node There are 2 oval some circumscribed mass at 2:30 and 3:00 position of the left breast.  0.6 cm x 1.3 cm and 4 x 7 mm Non-mass enhancement extending 2.4 cm these masses is 3.6 cm mass.  Findings suggestive of several pulmonary nodules.  PET  scan shows extensive disease, 4.started on Taxotere,perjeta, Herceptin(January 03, 2015). 5.  Patient is on maintenance Herceptin that has been started on letrozole   And [facility] of 2016 [facility] 2017] MAY 2017- IMPRESSION: 1. Interval progression of disease. 2. There has been increase in size and FDG uptake associated  with the left axillary and left subpectoral lymph nodes. 3. Increase in FDG uptake associated with hypermetabolic sacral metastases. 4. Mild increase in FDG uptake associated with nonspecific activity within bilateral hilar regions.        Breast cancer ([place])   12/08/2013 Initial Diagnosis Breast cancer    Malignant neoplasm of axillary tail of female breast ([place])   12/16/2014 Initial Diagnosis Malignant neoplasm of axilla     INTERVAL HISTORY:  Tammy Pope 70 y.o.  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  She began every 3 week Kadcyla on 06/15/2016.  She tolerated her infusion well.  She denied any side effects.   REVIEW OF SYSTEMS:  A complete 10 point review of system is done which is negative except mentioned above/history of present illness.   PAST MEDICAL HISTORY :  Past Medical History  Diagnosis Date  . Hypertension 2009  . 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 Special screening for malignant neoplasms, colon   . Hyperlipidemia 1990  . Floaters 2014  . Cancer [facility]) 2010    right breast ductal carcinoma in situ  . Malignant neoplasm of upper-outer quadrant of female breast [facility]) February 21, 2009    DCIS of the right breast, intermediate grade, resected the negative margins. ER 90%, PR 50%, MammoSite partial breast radiation  . Breast cancer ([place]) 02/21/2009  . Malignant neoplasm of axillary tail of  female breast ([place]) 12/16/2014  . Axillary mass 12/15/2014    PAST SURGICAL HISTORY :   Past Surgical History  Procedure Laterality Date  . Appendectomy  1956  . Partial hysterectomy  1977  . Colonoscopy  2008    ??? Md  . Breast surgery Right 2010    wide excision  . Breast biopsy Right 2010    stereo  . Left axilla  biopsy Left 12-14-14    FAMILY HISTORY :   Family History  Problem Relation Age of Onset  . Cancer Other     unknown family members with ovarian,colon,breast cancers  . Hypertension Mother   . Heart disease Daughter     MI    SOCIAL HISTORY:   Social History  Substance Use Topics  . Smoking status: Never Smoker   . Smokeless tobacco: Never Used  . Alcohol Use: No    ALLERGIES:  is allergic to penicillin g and sulfa antibiotics.  MEDICATIONS:  Current Outpatient Prescriptions  Medication Sig Dispense Refill  . ALPRAZolam (XANAX) 0.5 MG tablet TAKE 1 TABLET BY MOUTH 3 TIMES A DAY AS NEEDED 90 tablet 1  . amLODipine (NORVASC) 5 MG tablet TAKE 1 TABLET (5 MG TOTAL) BY MOUTH DAILY. 30 tablet 3  . aspirin 81 MG tablet Take 81 mg by mouth daily.    . Calcium 600-200 MG-UNIT tablet Take 1 tablet by mouth 2 (two) times daily. 120 tablet 3  . Multiple Vitamins-Minerals (MULTIVITAMIN ADULT PO) Take by mouth.    . ONE TOUCH ULTRA TEST test strip     . potassium chloride SA (KLOR-CON M20) 20 MEQ tablet Take 2 tablets (40 mEq total) by mouth daily. 60 tablet 3  . rosuvastatin (CRESTOR) 5 MG tablet TAKE 1 TABLET (5 MG TOTAL) BY MOUTH DAILY. 90 tablet 1  . valsartan-hydrochlorothiazide (DIOVAN-HCT) 320-25 MG tablet TAKE 1 TABLET BY MOUTH DAILY 30 tablet 6  . letrozole (FEMARA) 2.5 MG tablet Take 1 tablet (2.5 mg total) by mouth daily. (Patient not taking: Reported on 07/06/2016) 30 tablet 6  . metFORMIN (GLUCOPHAGE) 500 MG tablet TAKE 1 TABLET TWICE EACH DAY ORAL (Patient not taking: Reported on 07/06/2016) 60 tablet 5  . palbociclib (IBRANCE) 125 MG capsule Take 1 capsule (125 mg total) by mouth daily with breakfast. Take whole with food for 21 days then 7 days off. (Patient not taking: Reported on 07/06/2016) 21 capsule 3   No current facility-administered medications for this visit.   Facility-Administered Medications Ordered in Other Visits  Medication Dose Route Frequency Provider Last  Rate Last Dose  . heparin lock flush 100 unit/mL  250 Units Intracatheter PRN Forest Gleason, MD      . sodium chloride 0.9 % 250 mL with potassium chloride 40 mEq infusion   Intravenous Continuous Forest Gleason, MD   Stopped at 05/03/15 1229  . sodium chloride 0.9 % injection 10 mL  10 mL Intracatheter PRN Forest Gleason, MD   10 mL at 04/18/15 1545    PHYSICAL EXAMINATION: ECOG PERFORMANCE STATUS: 0 - Asymptomatic  BP 163/88 mmHg  Pulse 105  Resp 18  Wt 141 lb 10.3 oz (64.25 kg)  LMP  (LMP Unknown)  Filed Weights   07/06/16 0954  Weight: 141 lb 10.3 oz (64.25 kg)    GENERAL:  Well developed, well nourished, sitting comfortably in the exam room in no acute distress.  She is accompanied by her husband. MENTAL STATUS:  Alert and oriented to person, place and time. HEAD:  Pearline Cables  shoulder length hair.  Normocephalic, atraumatic, face symmetric, no Cushingoid features. EYES:  Pupils equal round and reactive to light and accomodation.  No conjunctivitis or scleral icterus. ENT:  Oropharynx clear without lesion.  Dentures.  Tongue normal. Mucous membranes moist.  NECK:  3 cm right cervical cyst. RESPIRATORY:  Clear to auscultation without rales, wheezes or rhonchi. CARDIOVASCULAR:  Regular rate and rhythm without murmur, rub or gallop. ABDOMEN:  Soft, non-tender, with active bowel sounds, and no hepatosplenomegaly.  No masses. SKIN:  No rashes, ulcers or lesions. EXTREMITIES: No edema, no skin discoloration or tenderness.  No palpable cords. LYMPH NODES: No palpable cervical, supraclavicular, axillary or inguinal adenopathy  NEUROLOGICAL: Unremarkable. PSYCH:  Appropriate.    LABORATORY DATA:  I have reviewed the data as listed    Component Value Date/Time   NA 138 07/06/2016 0914   NA 136 09/16/2015 0843   NA 138 03/28/2015 0923   K 3.4* 07/06/2016 0914   K 3.3* 03/28/2015 0923   CL 103 07/06/2016 0914   CL 101 03/28/2015 0923   CO2 26 07/06/2016 0914   CO2 28 03/28/2015 0923    GLUCOSE 211* 07/06/2016 0914   GLUCOSE 145* 09/16/2015 0843   GLUCOSE 173* 03/28/2015 0923   BUN 10 07/06/2016 0914   BUN 10 09/16/2015 0843   BUN 9 03/28/2015 0923   CREATININE 0.71 07/06/2016 0914   CREATININE 0.56 03/28/2015 0923   CALCIUM 9.2 07/06/2016 0914   CALCIUM 8.6* 03/28/2015 0923   PROT 8.2* 07/06/2016 0914   PROT 7.2 09/16/2015 0843   PROT 7.1 03/28/2015 0923   ALBUMIN 4.2 07/06/2016 0914   ALBUMIN 4.6 09/16/2015 0843   ALBUMIN 3.6 03/28/2015 0923   AST 39 07/06/2016 0914   AST 43* 03/28/2015 0923   ALT 26 07/06/2016 0914   ALT 26 03/28/2015 0923   ALKPHOS 89 07/06/2016 0914   ALKPHOS 85 03/28/2015 0923   BILITOT 0.8 07/06/2016 0914   BILITOT 0.9 09/16/2015 0843   BILITOT 0.6 03/28/2015 0923   GFRNONAA >60 07/06/2016 0914   GFRNONAA >60 03/28/2015 0923   GFRNONAA >60 01/20/2015 0920   GFRAA >60 07/06/2016 0914   GFRAA >60 03/28/2015 0923   GFRAA >60 01/20/2015 0920    No results found for: SPEP, UPEP  Lab Results  Component Value Date   WBC 8.5 07/06/2016   NEUTROABS 5.6 07/06/2016   HGB 12.9 07/06/2016   HCT 37.9 07/06/2016   MCV 91.6 07/06/2016   PLT 172 07/06/2016      Chemistry      Component Value Date/Time   NA 138 07/06/2016 0914   NA 136 09/16/2015 0843   NA 138 03/28/2015 0923   K 3.4* 07/06/2016 0914   K 3.3* 03/28/2015 0923   CL 103 07/06/2016 0914   CL 101 03/28/2015 0923   CO2 26 07/06/2016 0914   CO2 28 03/28/2015 0923   BUN 10 07/06/2016 0914   BUN 10 09/16/2015 0843   BUN 9 03/28/2015 0923   CREATININE 0.71 07/06/2016 0914   CREATININE 0.56 03/28/2015 0923      Component Value Date/Time   CALCIUM 9.2 07/06/2016 0914   CALCIUM 8.6* 03/28/2015 0923   ALKPHOS 89 07/06/2016 0914   ALKPHOS 85 03/28/2015 0923   AST 39 07/06/2016 0914   AST 43* 03/28/2015 0923   ALT 26 07/06/2016 0914   ALT 26 03/28/2015 0923   BILITOT 0.8 07/06/2016 0914   BILITOT 0.9 09/16/2015 0843   BILITOT 0.6 03/28/2015 1660  RADIOGRAPHIC  STUDIES: I have personally reviewed the radiological images as listed and agreed with the findings in the report. No results found.   ASSESSMENT & PLAN:  Malignant neoplasm of axillary tail of female breast ([place]) Metastatic breast cancer to the bone/lung with progression on Ibrance plus letrozole noted on most recent PET scan in May 2017. CA27.29 was 37 (normal) on 06/08/2016.  Will check CA15-3 at next visit (may be a marker than can be followed).  Patient is asymptomatic.  # Cycle #1 Kadcyla given on 06/15/2016.  Begin cycle #2 Kadcyla today.  Tolerating well. No side effects.  # Bone metastases- continue Xgeva.  Patient last received Xgeva on 06/08/2016.  Give Delton See today (4 weeks since last dose) then every 6 weeks (every other treatment with Kadcyla).  Discuss calcium supplementation.  # Hypokalemia.  Review patient medications.  She has not been taking her potassium regularly.    All questions were answered. The patient knows to call the clinic with any problems, questions or concerns.      Lequita Asal, MD 07/06/2016 9:26 AM

## 2016-07-10 ENCOUNTER — Telehealth: Payer: Self-pay | Admitting: *Deleted

## 2016-07-10 ENCOUNTER — Encounter: Payer: Self-pay | Admitting: *Deleted

## 2016-07-10 NOTE — Telephone Encounter (Signed)
Called to inform patient that her letter for jury duty has been written.  It will be available tomorrow at the [facility] at [employer].  (Team [place] in [place] today).  Patient verbalized understanding.

## 2016-07-25 ENCOUNTER — Telehealth: Payer: Self-pay | Admitting: *Deleted

## 2016-07-25 NOTE — Telephone Encounter (Signed)
Meagan called asking for verification that patient is continuing on Ibrance. Informed her that patient is continuing treatment.  She will reach out to patient for new shipment.

## 2016-07-26 NOTE — Telephone Encounter (Signed)
Erroneous entry

## 2016-07-27 ENCOUNTER — Inpatient Hospital Stay: Payer: Medicare Other

## 2016-07-27 ENCOUNTER — Other Ambulatory Visit: Payer: Self-pay

## 2016-07-27 ENCOUNTER — Inpatient Hospital Stay: Payer: Medicare Other | Attending: Internal Medicine | Admitting: Internal Medicine

## 2016-07-27 DIAGNOSIS — I10 Essential (primary) hypertension: Secondary | ICD-10-CM

## 2016-07-27 DIAGNOSIS — Z79899 Other long term (current) drug therapy: Secondary | ICD-10-CM | POA: Diagnosis not present

## 2016-07-27 DIAGNOSIS — C50612 Malignant neoplasm of axillary tail of left female breast: Secondary | ICD-10-CM

## 2016-07-27 DIAGNOSIS — Z5112 Encounter for antineoplastic immunotherapy: Secondary | ICD-10-CM | POA: Diagnosis not present

## 2016-07-27 DIAGNOSIS — Z800 Family history of malignant neoplasm of digestive organs: Secondary | ICD-10-CM

## 2016-07-27 DIAGNOSIS — Z803 Family history of malignant neoplasm of breast: Secondary | ICD-10-CM | POA: Diagnosis not present

## 2016-07-27 DIAGNOSIS — C7951 Secondary malignant neoplasm of bone: Secondary | ICD-10-CM

## 2016-07-27 DIAGNOSIS — Z7984 Long term (current) use of oral hypoglycemic drugs: Secondary | ICD-10-CM | POA: Diagnosis not present

## 2016-07-27 DIAGNOSIS — C50422 Malignant neoplasm of upper-outer quadrant of left male breast: Secondary | ICD-10-CM

## 2016-07-27 DIAGNOSIS — C7800 Secondary malignant neoplasm of unspecified lung: Secondary | ICD-10-CM

## 2016-07-27 DIAGNOSIS — Z79810 Long term (current) use of selective estrogen receptor modulators (SERMs): Secondary | ICD-10-CM

## 2016-07-27 DIAGNOSIS — E785 Hyperlipidemia, unspecified: Secondary | ICD-10-CM | POA: Diagnosis not present

## 2016-07-27 DIAGNOSIS — Z923 Personal history of irradiation: Secondary | ICD-10-CM | POA: Diagnosis not present

## 2016-07-27 DIAGNOSIS — Z7982 Long term (current) use of aspirin: Secondary | ICD-10-CM

## 2016-07-27 DIAGNOSIS — Z170 Estrogen receptor positive status [ER+]: Secondary | ICD-10-CM | POA: Diagnosis not present

## 2016-07-27 DIAGNOSIS — C50412 Malignant neoplasm of upper-outer quadrant of left female breast: Secondary | ICD-10-CM

## 2016-07-27 DIAGNOSIS — Z8041 Family history of malignant neoplasm of ovary: Secondary | ICD-10-CM | POA: Diagnosis not present

## 2016-07-27 LAB — CBC WITH DIFFERENTIAL/PLATELET
Basophils Absolute: 0.1 10*3/uL (ref 0–0.1)
Basophils Relative: 1 %
Eosinophils Absolute: 0.3 10*3/uL (ref 0–0.7)
Eosinophils Relative: 5 %
HCT: 37.3 % (ref 35.0–47.0)
Hemoglobin: 12.7 g/dL (ref 12.0–16.0)
Lymphocytes Relative: 22 %
Lymphs Abs: 1.5 10*3/uL (ref 1.0–3.6)
MCH: 30 pg (ref 26.0–34.0)
MCHC: 34.1 g/dL (ref 32.0–36.0)
MCV: 87.8 fL (ref 80.0–100.0)
Monocytes Absolute: 0.5 10*3/uL (ref 0.2–0.9)
Monocytes Relative: 8 %
Neutro Abs: 4.4 10*3/uL (ref 1.4–6.5)
Neutrophils Relative %: 64 %
Platelets: 153 10*3/uL (ref 150–440)
RBC: 4.25 MIL/uL (ref 3.80–5.20)
RDW: 15.5 % — ABNORMAL HIGH (ref 11.5–14.5)
WBC: 6.7 10*3/uL (ref 3.6–11.0)

## 2016-07-27 LAB — COMPREHENSIVE METABOLIC PANEL
ALT: 30 U/L (ref 14–54)
AST: 45 U/L — ABNORMAL HIGH (ref 15–41)
Albumin: 4.1 g/dL (ref 3.5–5.0)
Alkaline Phosphatase: 115 U/L (ref 38–126)
Anion gap: 10 (ref 5–15)
BUN: 11 mg/dL (ref 6–20)
CO2: 26 mmol/L (ref 22–32)
Calcium: 9 mg/dL (ref 8.9–10.3)
Chloride: 103 mmol/L (ref 101–111)
Creatinine, Ser: 0.63 mg/dL (ref 0.44–1.00)
GFR calc Af Amer: >60 mL/min (ref >60)
GFR calc non Af Amer: >60 mL/min (ref >60)
Glucose, Bld: 193 mg/dL — ABNORMAL HIGH (ref 65–99)
Potassium: 3.6 mmol/L (ref 3.5–5.1)
Sodium: 139 mmol/L (ref 135–145)
Total Bilirubin: 0.7 mg/dL (ref 0.3–1.2)
Total Protein: 8.1 g/dL (ref 6.5–8.1)

## 2016-07-27 LAB — MAGNESIUM: Magnesium: 1.9 mg/dL (ref 1.7–2.4)

## 2016-07-27 MED ORDER — SODIUM CHLORIDE 0.9 % IV SOLN
Freq: Once | INTRAVENOUS | Status: AC
  Administered 2016-07-27: 11:00:00 via INTRAVENOUS
  Filled 2016-07-27: qty 1000

## 2016-07-27 MED ORDER — ACETAMINOPHEN 325 MG PO TABS
650.0000 mg | ORAL_TABLET | Freq: Once | ORAL | Status: AC
  Administered 2016-07-27: 650 mg via ORAL
  Filled 2016-07-27: qty 2

## 2016-07-27 MED ORDER — DIPHENHYDRAMINE HCL 25 MG PO CAPS
50.0000 mg | ORAL_CAPSULE | Freq: Once | ORAL | Status: AC
  Administered 2016-07-27: 50 mg via ORAL
  Filled 2016-07-27: qty 2

## 2016-07-27 MED ORDER — HEPARIN SOD (PORK) LOCK FLUSH 100 UNIT/ML IV SOLN
500.0000 [IU] | Freq: Once | INTRAVENOUS | Status: AC | PRN
  Administered 2016-07-27: 500 [IU]
  Filled 2016-07-27: qty 5

## 2016-07-27 MED ORDER — ADO-TRASTUZUMAB EMTANSINE CHEMO INJECTION 160 MG
200.0000 mg | Freq: Once | INTRAVENOUS | Status: AC
  Administered 2016-07-27: 200 mg via INTRAVENOUS
  Filled 2016-07-27: qty 10

## 2016-07-27 NOTE — Assessment & Plan Note (Signed)
Metastatic breast cancer to the bone/lung- currently on ibrance plus letrozole. Progression noted- most recent PET scan in May 2017. I'm awaiting for tumor marker from today. Patient is asymptomatic.  # Kadcyla s/p # 1 cycle; proceed with #2. Tolerating well.   # bone metastases- continue Xgeva every 6 weeks  # Follow up in 3 weeks/kadcyla- X-geva/ tumor marker.

## 2016-07-27 NOTE — Progress Notes (Signed)
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  Estrogen receptor 90%.  Progesterone receptor 60%.  Patient had lumpectomy and MammoSite partial breast radiation therapy followed by tamoxifen for 5 years.  Patient is still taking tamoxifen.. 2 recent mammogram in December of 2015 negative.  Further evaluation by surgeon revealed a palpable mass in the left upper outer quadrant near axilla.  Ultrasound revealed one small normal-appearing lymph node 0.6 cm in lower limit of axilla.  Adjacent to this there was ill-defined multilobulated hypoechoic mass 1.8 x 2.6 cm.  Biopsy of the left axillary mass was done which was positive for high-grade carcinoma.hormone receptors are pending  2,estrogen receptor positive.  Progesterone receptor positive.  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 3.  MRI scan of breast (January, 2015) reveals multiple abnormal enlarged left axillary lymph node There are 2 oval some circumscribed mass at 2:30 and 3:00 position of the left breast.  0.6 cm x 1.3 cm and 4 x 7 mm Non-mass enhancement extending 2.4 cm these masses is 3.6 cm mass.  Findings suggestive of several pulmonary nodules.  PET  scan shows extensive disease, 4.started on Taxotere,perjeta, Herceptin(January 03, 2015). 5.  Patient is on maintenance Herceptin that has been started on letrozole   And [facility] of 2016 [facility] 2017] MAY 2017- 1. Interval progression of disease. 2. There has been increase in size and FDG uptake associated with the left  axillary and left subpectoral lymph nodes. 3. Increase in FDG uptake associated with hypermetabolic sacral metastases. 4. Mild increase in FDG uptake associated with nonspecific activity within bilateral hilar regions.  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  She noted to have improvement of her left underarm lump.  Patient denies any nausea vomiting. Denies any bone pain. Denies any worsening swelling in in the legs. Her appetite is good. No weight loss. No shortness of breath. No chest pain. Denies any tingling or numbness. No fevers or chills.   REVIEW OF SYSTEMS:  A complete 10 point review of system is done which is negative except mentioned above/history of present illness.   PAST MEDICAL HISTORY :  Past Medical History:  Diagnosis Date  . Axillary mass 12/15/2014  . Breast cancer ([place]) 02/21/2009  . Cancer [facility]) 2010   right breast ductal carcinoma  in situ  . Floaters 2014  . Hyperlipidemia 1990  . Hypertension 2009  . Malignant neoplasm of axillary tail of female breast ([place]) 12/16/2014  . Malignant neoplasm of upper-outer quadrant of female breast [facility]) February 21, 2009   DCIS of the right breast, intermediate grade, resected the negative margins. ER 90%, PR 50%, MammoSite partial breast radiation  . 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 Special screening for malignant neoplasms, colon     PAST SURGICAL HISTORY :   Past Surgical History:  Procedure Laterality Date  . APPENDECTOMY  1956  . BREAST BIOPSY Right 2010   stereo  . BREAST SURGERY Right 2010   wide excision  . COLONOSCOPY  2008   ??? Md  . left axilla biopsy Left 12-14-14  . PARTIAL HYSTERECTOMY  1977    FAMILY HISTORY :   Family History  Problem Relation Age of Onset  . Cancer Other     unknown family members with ovarian,colon,breast cancers  . Hypertension Mother   . Heart disease Daughter     MI    SOCIAL HISTORY:   Social History  Substance Use Topics  . Smoking status: Never Smoker  . Smokeless tobacco: Never Used  . Alcohol use No    ALLERGIES:  is allergic to penicillin g and sulfa antibiotics.  MEDICATIONS:  Current Outpatient Prescriptions  Medication Sig Dispense Refill  . ALPRAZolam (XANAX) 0.5 MG tablet TAKE 1 TABLET BY MOUTH 3 TIMES A DAY AS NEEDED 90 tablet 1  . amLODipine (NORVASC) 5 MG tablet TAKE 1 TABLET (5 MG TOTAL) BY MOUTH DAILY. 30 tablet 3  . aspirin 81 MG tablet Take 81 mg by mouth daily.    . Calcium 600-200 MG-UNIT tablet Take 1 tablet by mouth 2 (two) times daily. 120 tablet 3  . metFORMIN (GLUCOPHAGE) 500 MG tablet TAKE 1 TABLET TWICE EACH DAY ORAL 60 tablet 5  . Multiple Vitamins-Minerals (MULTIVITAMIN ADULT PO) Take by mouth.    . ONE TOUCH ULTRA TEST test strip     . potassium chloride SA (KLOR-CON M20) 20 MEQ tablet Take 2 tablets (40 mEq total) by mouth daily. 60 tablet 3  . rosuvastatin (CRESTOR) 5 MG tablet TAKE 1 TABLET (5 MG TOTAL) BY MOUTH DAILY. 90 tablet 1  . valsartan-hydrochlorothiazide (DIOVAN-HCT) 320-25 MG tablet TAKE 1 TABLET BY MOUTH DAILY 30 tablet 6  . letrozole (FEMARA) 2.5 MG tablet Take 1 tablet (2.5 mg total) by mouth daily. (Patient not taking: Reported on 07/06/2016) 30 tablet 6   No current facility-administered medications for this visit.    Facility-Administered  Medications Ordered in Other Visits  Medication Dose Route Frequency Provider Last Rate Last Dose  . heparin lock flush 100 unit/mL  250 Units Intracatheter PRN Forest Gleason, MD      . sodium chloride 0.9 % 250 mL with potassium chloride 40 mEq infusion   Intravenous Continuous Forest Gleason, MD   Stopped at 05/03/15 1229  . sodium chloride 0.9 % injection 10 mL  10 mL Intracatheter PRN Forest Gleason, MD   10 mL at 04/18/15 1545    PHYSICAL EXAMINATION: ECOG PERFORMANCE STATUS: 0 - Asymptomatic  BP (!) 142/85 (BP Location: Left Arm, Patient Position: Sitting)   Pulse 82   Temp 97.3 F (36.3 C) (Tympanic)   Resp 18   Wt 142 lb 6 oz (64.6 kg)   LMP  (LMP Unknown)   BMI 25.79 kg/m   [employer]  07/27/16 0937  Weight: 142 lb 6 oz (64.6 kg)    GENERAL: Well-nourished well-developed; Alert, no distress and comfortable.   Accompanied by her husband. EYES: no pallor or icterus OROPHARYNX: no thrush or ulceration; poor dentition  NECK: supple, no masses felt LYMPH:  no palpable lymphadenopathy in the cervical, axillary or inguinal regions LUNGS: clear to auscultation and  No wheeze or crackles HEART/CVS: regular rate & rhythm and no murmurs; No lower extremity edema ABDOMEN:abdomen soft, non-tender and normal bowel sounds Musculoskeletal:no cyanosis of digits and no clubbing  PSYCH: alert & oriented x 3 with fluent speech NEURO: no focal motor/sensory deficits SKIN:  no rashes or significant lesions LEFT BREAST- 2-3 cm mass left upper outer quadrant/axillary tail. Nontender.  LABORATORY DATA:  I have reviewed the data as listed    Component Value Date/Time   NA 139 07/27/2016 0909   NA 136 09/16/2015 0843   NA 138 03/28/2015 0923   K 3.6 07/27/2016 0909   K 3.3 (L) 03/28/2015 0923   CL 103 07/27/2016 0909   CL 101 03/28/2015 0923   CO2 26 07/27/2016 0909   CO2 28 03/28/2015 0923   GLUCOSE 193 (H) 07/27/2016 0909   GLUCOSE 173 (H) 03/28/2015 0923   BUN 11 07/27/2016 0909    BUN 10 09/16/2015 0843   BUN 9 03/28/2015 0923   CREATININE 0.63 07/27/2016 0909   CREATININE 0.56 03/28/2015 0923   CALCIUM 9.0 07/27/2016 0909   CALCIUM 8.6 (L) 03/28/2015 0923   PROT 8.1 07/27/2016 0909   PROT 7.2 09/16/2015 0843   PROT 7.1 03/28/2015 0923   ALBUMIN 4.1 07/27/2016 0909   ALBUMIN 4.6 09/16/2015 0843   ALBUMIN 3.6 03/28/2015 0923   AST 45 (H) 07/27/2016 0909   AST 43 (H) 03/28/2015 0923   ALT 30 07/27/2016 0909   ALT 26 03/28/2015 0923   ALKPHOS 115 07/27/2016 0909   ALKPHOS 85 03/28/2015 0923   BILITOT 0.7 07/27/2016 0909   BILITOT 0.9 09/16/2015 0843   BILITOT 0.6 03/28/2015 0923   GFRNONAA >60 07/27/2016 0909   GFRNONAA >60 03/28/2015 0923   GFRAA >60 07/27/2016 0909   GFRAA >60 03/28/2015 0923    No results found for: SPEP, UPEP  Lab Results  Component Value Date   WBC 6.7 07/27/2016   NEUTROABS 4.4 07/27/2016   HGB 12.7 07/27/2016   HCT 37.3 07/27/2016   MCV 87.8 07/27/2016   PLT 153 07/27/2016      Chemistry      Component Value Date/Time   NA 139 07/27/2016 0909   NA 136 09/16/2015 0843   NA 138 03/28/2015 0923   K 3.6 07/27/2016 0909   K 3.3 (L) 03/28/2015 0923   CL 103 07/27/2016 0909   CL 101 03/28/2015 0923   CO2 26 07/27/2016 0909   CO2 28 03/28/2015 0923   BUN 11 07/27/2016 0909   BUN 10 09/16/2015 0843   BUN 9 03/28/2015 0923   CREATININE 0.63 07/27/2016 0909   CREATININE 0.56 03/28/2015 0923      Component Value Date/Time   CALCIUM 9.0 07/27/2016 0909   CALCIUM 8.6 (L) 03/28/2015 0923   ALKPHOS 115 07/27/2016 0909   ALKPHOS 85 03/28/2015 0923   AST 45 (H) 07/27/2016 0909   AST 43 (H) 03/28/2015 0923   ALT 30 07/27/2016 0909   ALT 26 03/28/2015 0923   BILITOT 0.7 07/27/2016 0909   BILITOT 0.9 09/16/2015 0843   BILITOT 0.6 03/28/2015 6759  RADIOGRAPHIC STUDIES: I have personally reviewed the radiological images as listed and agreed with the findings in the report. No results found.   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 Progression noted- most recent PET scan in May 2017. I'm awaiting for tumor marker from today. Patient is asymptomatic.  # Kadcyla s/p # 1 cycle; proceed with #2. Tolerating well.   # bone metastases- continue Xgeva every 6 weeks  # Follow up in 3 weeks/kadcyla- X-geva/ tumor marker.   No orders of the defined types were placed in this encounter.  All questions were answered. The patient knows to call the clinic with any problems, questions or concerns.      Cammie Sickle, MD 07/27/2016 8:26 PM

## 2016-07-28 LAB — CANCER ANTIGEN 15-3: CA 15-3: 33.4 U/mL — ABNORMAL HIGH (ref 0.0–25.0)

## 2016-08-17 ENCOUNTER — Inpatient Hospital Stay (HOSPITAL_BASED_OUTPATIENT_CLINIC_OR_DEPARTMENT_OTHER): Payer: Medicare Other | Admitting: Internal Medicine

## 2016-08-17 ENCOUNTER — Inpatient Hospital Stay: Payer: Medicare Other | Attending: Internal Medicine

## 2016-08-17 ENCOUNTER — Inpatient Hospital Stay: Payer: Medicare Other

## 2016-08-17 VITALS — BP 148/78 | HR 109 | Temp 96.4°F | Resp 18 | Wt 144.5 lb

## 2016-08-17 DIAGNOSIS — Z79899 Other long term (current) drug therapy: Secondary | ICD-10-CM

## 2016-08-17 DIAGNOSIS — I2510 Atherosclerotic heart disease of native coronary artery without angina pectoris: Secondary | ICD-10-CM | POA: Diagnosis not present

## 2016-08-17 DIAGNOSIS — Z808 Family history of malignant neoplasm of other organs or systems: Secondary | ICD-10-CM | POA: Diagnosis not present

## 2016-08-17 DIAGNOSIS — I517 Cardiomegaly: Secondary | ICD-10-CM | POA: Insufficient documentation

## 2016-08-17 DIAGNOSIS — Z853 Personal history of malignant neoplasm of breast: Secondary | ICD-10-CM

## 2016-08-17 DIAGNOSIS — C7800 Secondary malignant neoplasm of unspecified lung: Secondary | ICD-10-CM

## 2016-08-17 DIAGNOSIS — E785 Hyperlipidemia, unspecified: Secondary | ICD-10-CM

## 2016-08-17 DIAGNOSIS — I10 Essential (primary) hypertension: Secondary | ICD-10-CM

## 2016-08-17 DIAGNOSIS — C50412 Malignant neoplasm of upper-outer quadrant of left female breast: Secondary | ICD-10-CM | POA: Insufficient documentation

## 2016-08-17 DIAGNOSIS — Z7982 Long term (current) use of aspirin: Secondary | ICD-10-CM

## 2016-08-17 DIAGNOSIS — Z170 Estrogen receptor positive status [ER+]: Secondary | ICD-10-CM

## 2016-08-17 DIAGNOSIS — C7951 Secondary malignant neoplasm of bone: Secondary | ICD-10-CM | POA: Diagnosis not present

## 2016-08-17 DIAGNOSIS — Z923 Personal history of irradiation: Secondary | ICD-10-CM | POA: Insufficient documentation

## 2016-08-17 DIAGNOSIS — Z7984 Long term (current) use of oral hypoglycemic drugs: Secondary | ICD-10-CM

## 2016-08-17 DIAGNOSIS — Z79811 Long term (current) use of aromatase inhibitors: Secondary | ICD-10-CM

## 2016-08-17 DIAGNOSIS — Z5111 Encounter for antineoplastic chemotherapy: Secondary | ICD-10-CM | POA: Diagnosis not present

## 2016-08-17 DIAGNOSIS — C50612 Malignant neoplasm of axillary tail of left female breast: Secondary | ICD-10-CM

## 2016-08-17 DIAGNOSIS — J90 Pleural effusion, not elsewhere classified: Secondary | ICD-10-CM | POA: Insufficient documentation

## 2016-08-17 DIAGNOSIS — R911 Solitary pulmonary nodule: Secondary | ICD-10-CM

## 2016-08-17 DIAGNOSIS — J8410 Pulmonary fibrosis, unspecified: Secondary | ICD-10-CM | POA: Diagnosis not present

## 2016-08-17 DIAGNOSIS — C50422 Malignant neoplasm of upper-outer quadrant of left male breast: Secondary | ICD-10-CM

## 2016-08-17 DIAGNOSIS — C50611 Malignant neoplasm of axillary tail of right female breast: Secondary | ICD-10-CM

## 2016-08-17 DIAGNOSIS — Z5112 Encounter for antineoplastic immunotherapy: Secondary | ICD-10-CM | POA: Insufficient documentation

## 2016-08-17 DIAGNOSIS — K7460 Unspecified cirrhosis of liver: Secondary | ICD-10-CM | POA: Insufficient documentation

## 2016-08-17 LAB — COMPREHENSIVE METABOLIC PANEL
ALBUMIN: 3.9 g/dL (ref 3.5–5.0)
ALK PHOS: 109 U/L (ref 38–126)
ALT: 28 U/L (ref 14–54)
AST: 49 U/L — AB (ref 15–41)
Anion gap: 8 (ref 5–15)
BILIRUBIN TOTAL: 0.8 mg/dL (ref 0.3–1.2)
BUN: 13 mg/dL (ref 6–20)
CO2: 25 mmol/L (ref 22–32)
CREATININE: 0.63 mg/dL (ref 0.44–1.00)
Calcium: 9 mg/dL (ref 8.9–10.3)
Chloride: 104 mmol/L (ref 101–111)
GFR calc Af Amer: >60 mL/min (ref >60)
GLUCOSE: 253 mg/dL — AB (ref 65–99)
Potassium: 3.4 mmol/L — ABNORMAL LOW (ref 3.5–5.1)
Sodium: 137 mmol/L (ref 135–145)
TOTAL PROTEIN: 8 g/dL (ref 6.5–8.1)

## 2016-08-17 LAB — CBC WITH DIFFERENTIAL/PLATELET
BASOS ABS: 0 10*3/uL (ref 0–0.1)
BASOS PCT: 1 %
Eosinophils Absolute: 0.3 10*3/uL (ref 0–0.7)
Eosinophils Relative: 5 %
HEMATOCRIT: 37.4 % (ref 35.0–47.0)
HEMOGLOBIN: 12.5 g/dL (ref 12.0–16.0)
LYMPHS PCT: 24 %
Lymphs Abs: 1.7 10*3/uL (ref 1.0–3.6)
MCH: 28.9 pg (ref 26.0–34.0)
MCHC: 33.3 g/dL (ref 32.0–36.0)
MCV: 86.9 fL (ref 80.0–100.0)
MONO ABS: 0.5 10*3/uL (ref 0.2–0.9)
Monocytes Relative: 8 %
NEUTROS ABS: 4.4 10*3/uL (ref 1.4–6.5)
NEUTROS PCT: 62 %
Platelets: 156 10*3/uL (ref 150–440)
RBC: 4.3 MIL/uL (ref 3.80–5.20)
RDW: 16.6 % — ABNORMAL HIGH (ref 11.5–14.5)
WBC: 7 10*3/uL (ref 3.6–11.0)

## 2016-08-17 MED ORDER — ACETAMINOPHEN 325 MG PO TABS
650.0000 mg | ORAL_TABLET | Freq: Once | ORAL | Status: AC
  Administered 2016-08-17: 650 mg via ORAL
  Filled 2016-08-17: qty 2

## 2016-08-17 MED ORDER — DENOSUMAB 120 MG/1.7ML ~~LOC~~ SOLN
120.0000 mg | Freq: Once | SUBCUTANEOUS | Status: AC
  Administered 2016-08-17: 120 mg via SUBCUTANEOUS
  Filled 2016-08-17: qty 1.7

## 2016-08-17 MED ORDER — DIPHENHYDRAMINE HCL 25 MG PO CAPS
50.0000 mg | ORAL_CAPSULE | Freq: Once | ORAL | Status: AC
  Administered 2016-08-17: 50 mg via ORAL
  Filled 2016-08-17: qty 2

## 2016-08-17 MED ORDER — ADO-TRASTUZUMAB EMTANSINE CHEMO INJECTION 160 MG
200.0000 mg | Freq: Once | INTRAVENOUS | Status: AC
  Administered 2016-08-17: 200 mg via INTRAVENOUS
  Filled 2016-08-17: qty 10

## 2016-08-17 MED ORDER — HEPARIN SOD (PORK) LOCK FLUSH 100 UNIT/ML IV SOLN
500.0000 [IU] | Freq: Once | INTRAVENOUS | Status: AC | PRN
  Administered 2016-08-17: 500 [IU]
  Filled 2016-08-17: qty 5

## 2016-08-17 MED ORDER — SODIUM CHLORIDE 0.9 % IV SOLN
Freq: Once | INTRAVENOUS | Status: AC
  Administered 2016-08-17: 10:00:00 via INTRAVENOUS
  Filled 2016-08-17: qty 1000

## 2016-08-17 NOTE — Progress Notes (Signed)
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  Estrogen receptor 90%.  Progesterone receptor 60%.  Patient had lumpectomy and MammoSite partial breast radiation therapy followed by tamoxifen for 5 years.  Patient is still taking tamoxifen.. 2 recent mammogram in December of 2015 negative.  Further evaluation by surgeon revealed a palpable mass in the left upper outer quadrant near axilla.  Ultrasound revealed one small normal-appearing lymph node 0.6 cm in lower limit of axilla.  Adjacent to this there was ill-defined multilobulated hypoechoic mass 1.8 x 2.6 cm.  Biopsy of the left axillary mass was done which was positive for high-grade carcinoma.hormone receptors are pending  2,estrogen receptor positive.  Progesterone receptor positive.  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 3.  MRI scan of breast (January, 2015) reveals multiple abnormal enlarged left axillary lymph node There are 2 oval some circumscribed mass at 2:30 and 3:00 position of the left breast.  0.6 cm x 1.3 cm and 4 x 7 mm Non-mass enhancement extending 2.4 cm these masses is 3.6 cm mass.  Findings suggestive of several pulmonary nodules.  PET  scan shows extensive disease, 4.started on Taxotere,perjeta, Herceptin(January 03, 2015). 5.  Patient is on maintenance Herceptin that has been started on letrozole   And [facility] of 2016 [facility] 2017] MAY 2017- 1. Interval progression of disease. 2. There has been increase in size and FDG uptake associated with the left  axillary and left subpectoral lymph nodes. 3. Increase in FDG uptake associated with hypermetabolic sacral metastases. 4. Mild increase in FDG uptake associated with nonspecific activity within bilateral hilar regions.  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   Patient has good energy. Denies any easy bruising. Denies any bleeding. He continues to notice improvement of her left underarm lump. No new shortness of breath or chest pain. No tingling and numbness.  Patient denies any nausea vomiting. Denies any bone pain. Denies any worsening swelling in in the legs. Her appetite is good. No weight loss. No fevers or chills.   REVIEW OF SYSTEMS:  A complete 10 point review of system is done which is negative except mentioned above/history of present illness.   PAST MEDICAL HISTORY :  Past Medical History:  Diagnosis Date  .  Axillary mass 12/15/2014  . Breast cancer ([place]) 02/21/2009  . Cancer [facility]) 2010   right breast ductal carcinoma in situ  . Floaters 2014  . Hyperlipidemia 1990  . Hypertension 2009  . 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 Malignant neoplasm of upper-outer quadrant of female breast [facility]) February 21, 2009   DCIS of the right breast, intermediate grade, resected the negative margins. ER  90%, PR 50%, MammoSite partial breast radiation  . 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 Special screening for malignant neoplasms, colon     PAST SURGICAL HISTORY :   Past Surgical History:  Procedure Laterality Date  . APPENDECTOMY  1956  . BREAST BIOPSY Right 2010   stereo  . BREAST SURGERY Right 2010   wide excision  . COLONOSCOPY  2008   ??? Md  . left axilla biopsy Left 12-14-14  . PARTIAL HYSTERECTOMY  1977    FAMILY HISTORY :   Family History  Problem Relation Age of Onset  . Cancer Other     unknown family members with ovarian,colon,breast cancers  . Hypertension Mother   . Heart disease Daughter     MI    SOCIAL HISTORY:   Social History  Substance Use Topics  . Smoking status: Never Smoker  . Smokeless tobacco: Never Used  . Alcohol use No    ALLERGIES:  is allergic to penicillin g and sulfa antibiotics.  MEDICATIONS:  Current Outpatient Prescriptions  Medication Sig Dispense Refill  . ALPRAZolam (XANAX) 0.5 MG tablet TAKE 1 TABLET BY MOUTH 3 TIMES A DAY AS NEEDED 90 tablet 1  . amLODipine (NORVASC) 5 MG tablet TAKE 1 TABLET (5 MG TOTAL) BY MOUTH DAILY. 30 tablet 3  . aspirin 81 MG tablet Take 81 mg by mouth daily.    . Calcium 600-200 MG-UNIT tablet Take 1 tablet by mouth 2 (two) times daily. 120 tablet 3  . letrozole (FEMARA) 2.5 MG tablet Take 1 tablet (2.5 mg total) by mouth daily. 30 tablet 6  . metFORMIN (GLUCOPHAGE) 500 MG tablet TAKE 1 TABLET TWICE EACH DAY ORAL 60 tablet 5  . Multiple Vitamins-Minerals (MULTIVITAMIN ADULT PO) Take by mouth.    . ONE TOUCH ULTRA TEST test strip     . potassium chloride SA (KLOR-CON M20) 20 MEQ tablet Take 2 tablets (40 mEq total) by mouth daily. 60 tablet 3  . rosuvastatin (CRESTOR) 5 MG tablet TAKE 1 TABLET (5 MG TOTAL) BY MOUTH DAILY. 90 tablet 1  . valsartan-hydrochlorothiazide (DIOVAN-HCT) 320-25 MG tablet TAKE 1 TABLET BY MOUTH DAILY 30 tablet 6   No current  facility-administered medications for this visit.    Facility-Administered Medications Ordered in Other Visits  Medication Dose Route Frequency Provider Last Rate Last Dose  . heparin lock flush 100 unit/mL  250 Units Intracatheter PRN Forest Gleason, MD      . sodium chloride 0.9 % 250 mL with potassium chloride 40 mEq infusion   Intravenous Continuous Forest Gleason, MD   Stopped at 05/03/15 1229  . sodium chloride 0.9 % injection 10 mL  10 mL Intracatheter PRN Forest Gleason, MD   10 mL at 04/18/15 1545    PHYSICAL EXAMINATION: ECOG PERFORMANCE STATUS: 0 - Asymptomatic  BP (!) 148/78 (BP Location: Left Arm, Patient Position: Sitting)   Pulse (!) 109   Temp (!) 96.4 F (35.8 C) (Tympanic)   Resp 18   Wt 144 lb 8 oz (65.5  kg)   LMP  (LMP Unknown)   BMI 26.18 kg/m   Filed Weights   08/17/16 0904  Weight: 144 lb 8 oz (65.5 kg)    GENERAL: Well-nourished well-developed; Alert, no distress and comfortable.   Accompanied by her husband. EYES: no pallor or icterus OROPHARYNX: no thrush or ulceration; poor dentition  NECK: supple, no masses felt LYMPH:  no palpable lymphadenopathy in the cervical, axillary or inguinal regions LUNGS: clear to auscultation and  No wheeze or crackles HEART/CVS: regular rate & rhythm and no murmurs; No lower extremity edema ABDOMEN:abdomen soft, non-tender and normal bowel sounds Musculoskeletal:no cyanosis of digits and no clubbing  PSYCH: alert & oriented x 3 with fluent speech NEURO: no focal motor/sensory deficits SKIN:  no rashes or significant lesions LEFT BREAST- 1-2cm mass left upper outer quadrant/axillary tail. Nontender.  LABORATORY DATA:  I have reviewed the data as listed    Component Value Date/Time   NA 137 08/17/2016 0846   NA 136 09/16/2015 0843   NA 138 03/28/2015 0923   K 3.4 (L) 08/17/2016 0846   K 3.3 (L) 03/28/2015 0923   CL 104 08/17/2016 0846   CL 101 03/28/2015 0923   CO2 25 08/17/2016 0846   CO2 28 03/28/2015 0923    GLUCOSE 253 (H) 08/17/2016 0846   GLUCOSE 173 (H) 03/28/2015 0923   BUN 13 08/17/2016 0846   BUN 10 09/16/2015 0843   BUN 9 03/28/2015 0923   CREATININE 0.63 08/17/2016 0846   CREATININE 0.56 03/28/2015 0923   CALCIUM 9.0 08/17/2016 0846   CALCIUM 8.6 (L) 03/28/2015 0923   PROT 8.0 08/17/2016 0846   PROT 7.2 09/16/2015 0843   PROT 7.1 03/28/2015 0923   ALBUMIN 3.9 08/17/2016 0846   ALBUMIN 4.6 09/16/2015 0843   ALBUMIN 3.6 03/28/2015 0923   AST 49 (H) 08/17/2016 0846   AST 43 (H) 03/28/2015 0923   ALT 28 08/17/2016 0846   ALT 26 03/28/2015 0923   ALKPHOS 109 08/17/2016 0846   ALKPHOS 85 03/28/2015 0923   BILITOT 0.8 08/17/2016 0846   BILITOT 0.9 09/16/2015 0843   BILITOT 0.6 03/28/2015 0923   GFRNONAA >60 08/17/2016 0846   GFRNONAA >60 03/28/2015 0923   GFRAA >60 08/17/2016 0846   GFRAA >60 03/28/2015 0923    No results found for: SPEP, UPEP  Lab Results  Component Value Date   WBC 7.0 08/17/2016   NEUTROABS 4.4 08/17/2016   HGB 12.5 08/17/2016   HCT 37.4 08/17/2016   MCV 86.9 08/17/2016   PLT 156 08/17/2016      Chemistry      Component Value Date/Time   NA 137 08/17/2016 0846   NA 136 09/16/2015 0843   NA 138 03/28/2015 0923   K 3.4 (L) 08/17/2016 0846   K 3.3 (L) 03/28/2015 0923   CL 104 08/17/2016 0846   CL 101 03/28/2015 0923   CO2 25 08/17/2016 0846   CO2 28 03/28/2015 0923   BUN 13 08/17/2016 0846   BUN 10 09/16/2015 0843   BUN 9 03/28/2015 0923   CREATININE 0.63 08/17/2016 0846   CREATININE 0.56 03/28/2015 0923      Component Value Date/Time   CALCIUM 9.0 08/17/2016 0846   CALCIUM 8.6 (L) 03/28/2015 0923   ALKPHOS 109 08/17/2016 0846   ALKPHOS 85 03/28/2015 0923   AST 49 (H) 08/17/2016 0846   AST 43 (H) 03/28/2015 0923   ALT 28 08/17/2016 0846   ALT 26 03/28/2015 0923   BILITOT 0.8 08/17/2016  0846   BILITOT 0.9 09/16/2015 0843   BILITOT 0.6 03/28/2015 6010       RADIOGRAPHIC STUDIES: I have personally reviewed the radiological  images as listed and agreed with the findings in the report. No results found.   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 Tolerating well; clinically  response noted.   # bone metastases- continue Xgeva every 6 weeks; due today.   # Follow up in 3 weeks/kadcyla- X-geva/ tumor marker. CT C/A/P- in 2 weeks.   Orders Placed This Encounter  Procedures  . CT CHEST W CONTRAST    Standing Status:   Future    Standing Expiration Date:   10/17/2017    Order Specific Question:   Reason for Exam (SYMPTOM  OR DIAGNOSIS REQUIRED)    Answer:   breast cancer    Order Specific Question:   Preferred imaging location?    Answer:   [facility]  . CT ABDOMEN PELVIS W CONTRAST    Standing Status:   Future    Standing Expiration Date:   11/16/2017    Order Specific Question:   Reason for Exam (SYMPTOM  OR DIAGNOSIS REQUIRED)    Answer:   breast cancer    Order Specific Question:   Preferred imaging location?    Answer:   [facility]  . CBC with Differential    Standing Status:   Future    Standing Expiration Date:   08/17/2017  . Comprehensive metabolic panel    Standing Status:   Future    Standing Expiration Date:   08/17/2017  . Cancer antigen 27.29    Standing Status:   Future    Standing Expiration Date:   08/17/2017   All questions were answered. The patient knows to call the clinic with any problems, questions or concerns.      Cammie Sickle, MD 08/21/2016 7:57 AM

## 2016-08-17 NOTE — Assessment & Plan Note (Addendum)
Metastatic breast cancer to the bone/lung.Progression noted- most recent PET scan in May 2017; currently On Kadcyla   #  Proceed with #4 . Tolerating well; clinically  response noted.   # bone metastases- continue Xgeva every 6 weeks; due today.   # Follow up in 3 weeks/kadcyla- X-geva/ tumor marker. CT C/A/P- in 2 weeks.

## 2016-08-18 LAB — CANCER ANTIGEN 27.29: CA 27.29: 64.7 U/mL — AB (ref 0.0–38.6)

## 2016-08-31 ENCOUNTER — Ambulatory Visit
Admission: RE | Admit: 2016-08-31 | Discharge: 2016-08-31 | Disposition: A | Discharge status: Home / Self Care | Payer: Medicare Other | Source: Ambulatory Visit | Attending: Internal Medicine | Admitting: Internal Medicine

## 2016-08-31 DIAGNOSIS — R590 Localized enlarged lymph nodes: Secondary | ICD-10-CM | POA: Insufficient documentation

## 2016-08-31 DIAGNOSIS — C782 Secondary malignant neoplasm of pleura: Secondary | ICD-10-CM | POA: Insufficient documentation

## 2016-08-31 DIAGNOSIS — I708 Atherosclerosis of other arteries: Secondary | ICD-10-CM | POA: Diagnosis not present

## 2016-08-31 DIAGNOSIS — I517 Cardiomegaly: Secondary | ICD-10-CM | POA: Insufficient documentation

## 2016-08-31 DIAGNOSIS — J90 Pleural effusion, not elsewhere classified: Secondary | ICD-10-CM | POA: Diagnosis not present

## 2016-08-31 DIAGNOSIS — J8410 Pulmonary fibrosis, unspecified: Secondary | ICD-10-CM | POA: Diagnosis not present

## 2016-08-31 DIAGNOSIS — I700 Atherosclerosis of aorta: Secondary | ICD-10-CM | POA: Diagnosis not present

## 2016-08-31 DIAGNOSIS — I2510 Atherosclerotic heart disease of native coronary artery without angina pectoris: Secondary | ICD-10-CM | POA: Diagnosis not present

## 2016-08-31 DIAGNOSIS — K7460 Unspecified cirrhosis of liver: Secondary | ICD-10-CM | POA: Diagnosis not present

## 2016-08-31 DIAGNOSIS — C50412 Malignant neoplasm of upper-outer quadrant of left female breast: Secondary | ICD-10-CM | POA: Insufficient documentation

## 2016-08-31 DIAGNOSIS — C7951 Secondary malignant neoplasm of bone: Secondary | ICD-10-CM | POA: Insufficient documentation

## 2016-08-31 DIAGNOSIS — C50912 Malignant neoplasm of unspecified site of left female breast: Secondary | ICD-10-CM | POA: Diagnosis not present

## 2016-08-31 MED ORDER — IOPAMIDOL (ISOVUE-300) INJECTION 61%
100.0000 mL | Freq: Once | INTRAVENOUS | Status: AC | PRN
  Administered 2016-08-31: 100 mL via INTRAVENOUS

## 2016-09-03 ENCOUNTER — Other Ambulatory Visit: Payer: Self-pay | Admitting: Internal Medicine

## 2016-09-03 ENCOUNTER — Telehealth: Payer: Self-pay | Admitting: *Deleted

## 2016-09-03 DIAGNOSIS — J90 Pleural effusion, not elsewhere classified: Secondary | ICD-10-CM | POA: Insufficient documentation

## 2016-09-03 NOTE — Discharge Instructions (Signed)
Thoracentesis, Care After °Refer to this sheet in the next few weeks. These instructions provide you with information about caring for yourself after your procedure. Your health care provider may also give you more specific instructions. Your treatment has been planned according to current medical practices, but problems sometimes occur. Call your health care provider if you have any problems or questions after your procedure. °WHAT TO EXPECT AFTER THE PROCEDURE °After your procedure, it is common to have pain at the puncture site. °HOME CARE INSTRUCTIONS °· Take medicines only as directed by your health care provider. °· You may return to your normal diet and normal activities as directed by your health care provider. °· Drink enough fluid to keep your urine clear or pale yellow. °· Do not take baths, swim, or use a hot tub until your health care provider approves. °· Follow your health care provider's instructions about: °¨ Puncture site care. °¨ Bandage (dressing) changes and removal. °· Check your puncture site every day for signs of infection. Watch for: °¨ Redness, swelling, or pain. °¨ Fluid, blood, or pus. °· Keep all follow-up visits as directed by your health care provider. This is important. °SEEK MEDICAL CARE IF: °· You have redness, swelling, or pain at your puncture site. °· You have fluid, blood, or pus coming from your puncture site. °· You have a fever. °· You have chills. °· You have nausea or vomiting. °· You have trouble breathing. °· You develop a worsening cough. °SEEK IMMEDIATE MEDICAL CARE IF: °· You have extreme shortness of breath. °· You develop chest pain. °· You faint or feel light-headed. °  °This information is not intended to replace advice given to you by your health care provider. Make sure you discuss any questions you have with your health care provider. °  °Document Released: 12/24/2014 Document Reviewed: 12/24/2014 °Elsevier Interactive Patient Education ©2016 [employer] ° °

## 2016-09-03 NOTE — Telephone Encounter (Signed)
-----   Message from Cammie Sickle, MD sent at 09/03/2016  8:20 AM EDT ----- Please inform patient that there is fluid buildup around the right lung-this needs to be taken out. I have put in Order for thoracentesis/labs; schedule it. Thx

## 2016-09-03 NOTE — Telephone Encounter (Signed)
Spoke with patient and patients husband to discuss plan of care.  Explained the need for a thoracentesis.  Teach back process.

## 2016-09-04 ENCOUNTER — Ambulatory Visit
Admission: RE | Admit: 2016-09-04 | Discharge: 2016-09-04 | Disposition: A | Discharge status: Home / Self Care | Payer: Medicare Other | Source: Ambulatory Visit | Attending: Diagnostic Radiology | Admitting: Diagnostic Radiology

## 2016-09-04 ENCOUNTER — Ambulatory Visit
Admission: RE | Admit: 2016-09-04 | Discharge: 2016-09-04 | Disposition: A | Discharge status: Home / Self Care | Payer: Medicare Other | Source: Ambulatory Visit | Attending: Internal Medicine | Admitting: Internal Medicine

## 2016-09-04 DIAGNOSIS — J948 Other specified pleural conditions: Secondary | ICD-10-CM | POA: Diagnosis not present

## 2016-09-04 DIAGNOSIS — Z9889 Other specified postprocedural states: Secondary | ICD-10-CM | POA: Insufficient documentation

## 2016-09-04 DIAGNOSIS — J90 Pleural effusion, not elsewhere classified: Secondary | ICD-10-CM

## 2016-09-04 LAB — BODY FLUID CELL COUNT WITH DIFFERENTIAL
Eos, Fluid: 1 %
LYMPHS FL: 78 %
MONOCYTE-MACROPHAGE-SEROUS FLUID: 16 %
Neutrophil Count, Fluid: 5 %
Other Cells, Fluid: 0 %
Total Nucleated Cell Count, Fluid: 9201 cu mm

## 2016-09-04 LAB — GLUCOSE, SEROUS FLUID: GLUCOSE FL: 121 mg/dL

## 2016-09-04 LAB — LACTATE DEHYDROGENASE, PLEURAL OR PERITONEAL FLUID: LD FL: 724 U/L — AB (ref 3–23)

## 2016-09-04 NOTE — Procedures (Signed)
Under US guidance, right thoracentesis was performed. No immediate complication.

## 2016-09-05 LAB — PH, BODY FLUID: PH, BODY FLUID: 7.5

## 2016-09-06 LAB — CYTOLOGY - NON PAP

## 2016-09-07 ENCOUNTER — Inpatient Hospital Stay: Payer: Medicare Other

## 2016-09-07 ENCOUNTER — Inpatient Hospital Stay (HOSPITAL_BASED_OUTPATIENT_CLINIC_OR_DEPARTMENT_OTHER): Payer: Medicare Other | Admitting: Internal Medicine

## 2016-09-07 VITALS — BP 167/73 | HR 101 | Temp 98.6°F | Resp 20 | Ht 62.3 in | Wt 146.6 lb

## 2016-09-07 DIAGNOSIS — C7951 Secondary malignant neoplasm of bone: Secondary | ICD-10-CM

## 2016-09-07 DIAGNOSIS — Z5111 Encounter for antineoplastic chemotherapy: Secondary | ICD-10-CM | POA: Diagnosis not present

## 2016-09-07 DIAGNOSIS — Z7984 Long term (current) use of oral hypoglycemic drugs: Secondary | ICD-10-CM

## 2016-09-07 DIAGNOSIS — R911 Solitary pulmonary nodule: Secondary | ICD-10-CM

## 2016-09-07 DIAGNOSIS — I517 Cardiomegaly: Secondary | ICD-10-CM

## 2016-09-07 DIAGNOSIS — I2510 Atherosclerotic heart disease of native coronary artery without angina pectoris: Secondary | ICD-10-CM

## 2016-09-07 DIAGNOSIS — Z923 Personal history of irradiation: Secondary | ICD-10-CM

## 2016-09-07 DIAGNOSIS — Z853 Personal history of malignant neoplasm of breast: Secondary | ICD-10-CM

## 2016-09-07 DIAGNOSIS — C801 Malignant (primary) neoplasm, unspecified: Secondary | ICD-10-CM

## 2016-09-07 DIAGNOSIS — J8410 Pulmonary fibrosis, unspecified: Secondary | ICD-10-CM

## 2016-09-07 DIAGNOSIS — Z808 Family history of malignant neoplasm of other organs or systems: Secondary | ICD-10-CM

## 2016-09-07 DIAGNOSIS — Z79899 Other long term (current) drug therapy: Secondary | ICD-10-CM

## 2016-09-07 DIAGNOSIS — C50412 Malignant neoplasm of upper-outer quadrant of left female breast: Secondary | ICD-10-CM | POA: Diagnosis not present

## 2016-09-07 DIAGNOSIS — Z170 Estrogen receptor positive status [ER+]: Secondary | ICD-10-CM

## 2016-09-07 DIAGNOSIS — I10 Essential (primary) hypertension: Secondary | ICD-10-CM

## 2016-09-07 DIAGNOSIS — E785 Hyperlipidemia, unspecified: Secondary | ICD-10-CM

## 2016-09-07 DIAGNOSIS — C7800 Secondary malignant neoplasm of unspecified lung: Secondary | ICD-10-CM | POA: Diagnosis not present

## 2016-09-07 DIAGNOSIS — Z5112 Encounter for antineoplastic immunotherapy: Secondary | ICD-10-CM | POA: Diagnosis not present

## 2016-09-07 DIAGNOSIS — Z7982 Long term (current) use of aspirin: Secondary | ICD-10-CM

## 2016-09-07 DIAGNOSIS — J90 Pleural effusion, not elsewhere classified: Secondary | ICD-10-CM

## 2016-09-07 DIAGNOSIS — K7460 Unspecified cirrhosis of liver: Secondary | ICD-10-CM

## 2016-09-07 DIAGNOSIS — Z79811 Long term (current) use of aromatase inhibitors: Secondary | ICD-10-CM

## 2016-09-07 LAB — CBC WITH DIFFERENTIAL/PLATELET
Basophils Absolute: 0.1 10*3/uL (ref 0–0.1)
Basophils Relative: 1 %
EOS ABS: 0.3 10*3/uL (ref 0–0.7)
EOS PCT: 6 %
HCT: 36.4 % (ref 35.0–47.0)
HEMOGLOBIN: 12.3 g/dL (ref 12.0–16.0)
LYMPHS ABS: 1.1 10*3/uL (ref 1.0–3.6)
LYMPHS PCT: 20 %
MCH: 28.6 pg (ref 26.0–34.0)
MCHC: 33.8 g/dL (ref 32.0–36.0)
MCV: 84.6 fL (ref 80.0–100.0)
MONOS PCT: 10 %
Monocytes Absolute: 0.5 10*3/uL (ref 0.2–0.9)
NEUTROS PCT: 63 %
Neutro Abs: 3.6 10*3/uL (ref 1.4–6.5)
Platelets: 158 10*3/uL (ref 150–440)
RBC: 4.3 MIL/uL (ref 3.80–5.20)
RDW: 17 % — ABNORMAL HIGH (ref 11.5–14.5)
WBC: 5.6 10*3/uL (ref 3.6–11.0)

## 2016-09-07 LAB — COMPREHENSIVE METABOLIC PANEL
ALK PHOS: 124 U/L (ref 38–126)
ALT: 22 U/L (ref 14–54)
ANION GAP: 9 (ref 5–15)
AST: 55 U/L — ABNORMAL HIGH (ref 15–41)
Albumin: 3.2 g/dL — ABNORMAL LOW (ref 3.5–5.0)
BILIRUBIN TOTAL: 0.7 mg/dL (ref 0.3–1.2)
BUN: 11 mg/dL (ref 6–20)
CALCIUM: 9.1 mg/dL (ref 8.9–10.3)
CO2: 25 mmol/L (ref 22–32)
CREATININE: 0.57 mg/dL (ref 0.44–1.00)
Chloride: 105 mmol/L (ref 101–111)
Glucose, Bld: 227 mg/dL — ABNORMAL HIGH (ref 65–99)
Potassium: 3.6 mmol/L (ref 3.5–5.1)
SODIUM: 139 mmol/L (ref 135–145)
TOTAL PROTEIN: 7.3 g/dL (ref 6.5–8.1)

## 2016-09-07 MED ORDER — HEPARIN SOD (PORK) LOCK FLUSH 100 UNIT/ML IV SOLN
500.0000 [IU] | Freq: Once | INTRAVENOUS | Status: AC
  Administered 2016-09-07: 500 [IU] via INTRAVENOUS

## 2016-09-07 MED ORDER — SODIUM CHLORIDE 0.9% FLUSH
10.0000 mL | Freq: Once | INTRAVENOUS | Status: AC
  Administered 2016-09-07: 10 mL via INTRAVENOUS
  Filled 2016-09-07: qty 10

## 2016-09-07 MED ORDER — HEPARIN SOD (PORK) LOCK FLUSH 100 UNIT/ML IV SOLN
INTRAVENOUS | Status: AC
  Filled 2016-09-07: qty 5

## 2016-09-07 NOTE — Progress Notes (Signed)
[facility] OFFICE PROGRESS NOTE  Patient Care Team: Kathrine Haddock, NP as PCP - General (Nurse Practitioner) Robert Bellow, MD (General Surgery) Forest Gleason, MD (Oncology)  Breast cancer [facility])   Staging form: Breast, AJCC 7th Edition     Clinical stage from 04/06/2015: Stage IV (T2, N2, M1) - Signed by Evlyn Kanner, NP on 04/06/2015    Oncology History   1.history of carcinoma of right breast status post lumpectomy and radiation therapy (upper and outer quadrant) February 21, 2009.  Estrogen receptor 90%.  Progesterone receptor 60%.  Patient had lumpectomy and MammoSite partial breast radiation therapy followed by tamoxifen for 5 years.  Patient is still taking tamoxifen.. 2 recent mammogram in December of 2015 negative.  Further evaluation by surgeon revealed a palpable mass in the left upper outer quadrant near axilla.  Ultrasound revealed one small normal-appearing lymph node 0.6 cm in lower limit of axilla.  Adjacent to this there was ill-defined multilobulated hypoechoic mass 1.8 x 2.6 cm.  Biopsy of the left axillary mass was done which was positive for high-grade carcinoma.   2,estrogen receptor positive.  Progesterone receptor positive.  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 3.  MRI scan of breast (January, 2015) reveals multiple abnormal enlarged left axillary lymph node There are 2 oval some circumscribed mass at 2:30 and 3:00 position of the left breast.  0.6 cm x 1.3 cm and 4 x 7 mm Non-mass enhancement extending 2.4 cm these masses is 3.6 cm mass.  Findings suggestive of several pulmonary nodules.  PET  scan shows extensive disease, 4.started on Taxotere,perjeta, Herceptin(January 03, 2015- June 2016.   # June 2016- Herceptin [Letrozole + Ibrance] until May 2017.   # MAY 2017- PET PROGRESSION; # July 2017- START KADCYLA  x4 cycles- SEP 2017CT- PROGRESSION  # SEP 2017- START Taxol + Herceptin.   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  female pleasant patient above history of ER/PR positive HER-2/neu positive metastatic breast cancer to the bone and lung- Currently on kadcyla is here for follow-up.Status post 4 cycles so far./Patient is accompanied by her granddaughters and husband to review the results of her CT scan.  In the interim patient had a right thoracentesis- for large pleural effusion noted on CT scan.  Patient has good energy. Denies any easy bruising. Denies any bleeding. Assuming the patient denies any new shortness of breath or chest pain. No tingling and numbness.  Patient denies any nausea vomiting. Denies any bone pain. Denies any worsening swelling in in the legs. Her appetite is good. No weight loss. No fevers or chills.   REVIEW OF SYSTEMS:  A complete 10 point review of system is done which is negative except mentioned above/history of present illness.   PAST MEDICAL HISTORY :  Past Medical History:  Diagnosis Date  . Axillary mass 12/15/2014  . Breast  cancer ([place]) 02/21/2009  . Cancer [facility]) 2010   right breast ductal carcinoma in situ  . Floaters 2014  . Hyperlipidemia 1990  . Hypertension 2009  . 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 Malignant neoplasm of upper-outer quadrant of female breast [facility]) February 21, 2009   DCIS of the right breast, intermediate grade, resected the negative margins. ER 90%, PR 50%, MammoSite partial breast radiation  . 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 Special screening for malignant neoplasms, colon     PAST SURGICAL HISTORY :   Past Surgical History:  Procedure Laterality Date  . APPENDECTOMY  1956  . BREAST BIOPSY Right 2010   stereo  . BREAST SURGERY Right 2010   wide excision  . COLONOSCOPY  2008   ??? Md  . left axilla biopsy Left 12-14-14  . PARTIAL HYSTERECTOMY  1977    FAMILY HISTORY :   Family History  Problem Relation Age of Onset  . Cancer Other     unknown family members with ovarian,colon,breast cancers  . Hypertension Mother   . Heart disease Daughter     MI    SOCIAL HISTORY:   Social History  Substance Use Topics  . Smoking status: Never Smoker  . Smokeless tobacco: Never Used  . Alcohol use No    ALLERGIES:  is allergic to penicillin g and sulfa antibiotics.  MEDICATIONS:  Current Outpatient Prescriptions  Medication Sig Dispense Refill  . ALPRAZolam (XANAX) 0.5 MG tablet TAKE 1 TABLET BY MOUTH 3 TIMES A DAY AS NEEDED 90 tablet 1  . amLODipine (NORVASC) 5 MG tablet TAKE 1 TABLET (5 MG TOTAL) BY MOUTH DAILY. 30 tablet 3  . aspirin 81 MG tablet Take 81 mg by mouth daily.    . Calcium 600-200 MG-UNIT tablet Take 1 tablet by mouth 2 (two) times daily. 120 tablet 3  . metFORMIN (GLUCOPHAGE) 500 MG tablet TAKE 1 TABLET TWICE EACH DAY ORAL 60 tablet 5  . Multiple Vitamins-Minerals (MULTIVITAMIN ADULT PO) Take by mouth.    . ONE TOUCH ULTRA TEST test strip     . potassium chloride SA (KLOR-CON M20) 20 MEQ tablet Take 2 tablets (40 mEq total) by mouth daily. 60 tablet 3  . rosuvastatin (CRESTOR) 5 MG tablet TAKE 1 TABLET (5 MG TOTAL) BY MOUTH DAILY. 90 tablet 1  . valsartan-hydrochlorothiazide (DIOVAN-HCT) 320-25 MG tablet TAKE 1 TABLET BY MOUTH DAILY 30 tablet 6   No current facility-administered medications for this visit.    Facility-Administered Medications Ordered in Other Visits  Medication Dose Route Frequency Provider Last Rate Last Dose  . heparin lock  flush 100 unit/mL  250 Units Intracatheter PRN Forest Gleason, MD      . sodium chloride 0.9 % 250 mL with potassium chloride 40 mEq infusion   Intravenous Continuous Forest Gleason, MD   Stopped at 05/03/15 1229  . sodium chloride 0.9 % injection 10 mL  10 mL Intracatheter PRN Forest Gleason, MD   10 mL at 04/18/15 1545    PHYSICAL EXAMINATION: ECOG PERFORMANCE STATUS: 0 - Asymptomatic  BP (!) 167/73 (Patient Position: Standing;Sitting)   Pulse (!) 101   Temp 98.6 F (37 C) (Tympanic)   Resp 20   Ht 5' 2.3" (1.582 m)   Wt 146 lb 9.7 oz (66.5 kg)   LMP  (LMP Unknown)   BMI 26.56 kg/m   Filed Weights   09/07/16 0859  Weight: 146  lb 9.7 oz (66.5 kg)    GENERAL: Well-nourished well-developed; Alert, no distress and comfortable.   Accompanied by her husband. EYES: no pallor or icterus OROPHARYNX: no thrush or ulceration; poor dentition  NECK: supple, no masses felt LYMPH:  no palpable lymphadenopathy in the cervical, axillary or inguinal regions LUNGS: clear to auscultation and  No wheeze or crackles HEART/CVS: regular rate & rhythm and no murmurs; No lower extremity edema ABDOMEN:abdomen soft, non-tender and normal bowel sounds Musculoskeletal:no cyanosis of digits and no clubbing  PSYCH: alert & oriented x 3 with fluent speech NEURO: no focal motor/sensory deficits SKIN:  no rashes or significant lesions LEFT BREAST- 1-2cm mass left upper outer quadrant/axillary tail. Nontender.  LABORATORY DATA:  I have reviewed the data as listed    Component Value Date/Time   NA 139 09/07/2016 0836   NA 136 09/16/2015 0843   NA 138 03/28/2015 0923   K 3.6 09/07/2016 0836   K 3.3 (L) 03/28/2015 0923   CL 105 09/07/2016 0836   CL 101 03/28/2015 0923   CO2 25 09/07/2016 0836   CO2 28 03/28/2015 0923   GLUCOSE 227 (H) 09/07/2016 0836   GLUCOSE 173 (H) 03/28/2015 0923   BUN 11 09/07/2016 0836   BUN 10 09/16/2015 0843   BUN 9 03/28/2015 0923   CREATININE 0.57 09/07/2016 0836   CREATININE  0.56 03/28/2015 0923   CALCIUM 9.1 09/07/2016 0836   CALCIUM 8.6 (L) 03/28/2015 0923   PROT 7.3 09/07/2016 0836   PROT 7.2 09/16/2015 0843   PROT 7.1 03/28/2015 0923   ALBUMIN 3.2 (L) 09/07/2016 0836   ALBUMIN 4.6 09/16/2015 0843   ALBUMIN 3.6 03/28/2015 0923   AST 55 (H) 09/07/2016 0836   AST 43 (H) 03/28/2015 0923   ALT 22 09/07/2016 0836   ALT 26 03/28/2015 0923   ALKPHOS 124 09/07/2016 0836   ALKPHOS 85 03/28/2015 0923   BILITOT 0.7 09/07/2016 0836   BILITOT 0.9 09/16/2015 0843   BILITOT 0.6 03/28/2015 0923   GFRNONAA >60 09/07/2016 0836   GFRNONAA >60 03/28/2015 0923   GFRAA >60 09/07/2016 0836   GFRAA >60 03/28/2015 0923    No results found for: SPEP, UPEP  Lab Results  Component Value Date   WBC 5.6 09/07/2016   NEUTROABS 3.6 09/07/2016   HGB 12.3 09/07/2016   HCT 36.4 09/07/2016   MCV 84.6 09/07/2016   PLT 158 09/07/2016      Chemistry      Component Value Date/Time   NA 139 09/07/2016 0836   NA 136 09/16/2015 0843   NA 138 03/28/2015 0923   K 3.6 09/07/2016 0836   K 3.3 (L) 03/28/2015 0923   CL 105 09/07/2016 0836   CL 101 03/28/2015 0923   CO2 25 09/07/2016 0836   CO2 28 03/28/2015 0923   BUN 11 09/07/2016 0836   BUN 10 09/16/2015 0843   BUN 9 03/28/2015 0923   CREATININE 0.57 09/07/2016 0836   CREATININE 0.56 03/28/2015 0923      Component Value Date/Time   CALCIUM 9.1 09/07/2016 0836   CALCIUM 8.6 (L) 03/28/2015 0923   ALKPHOS 124 09/07/2016 0836   ALKPHOS 85 03/28/2015 0923   AST 55 (H) 09/07/2016 0836   AST 43 (H) 03/28/2015 0923   ALT 22 09/07/2016 0836   ALT 26 03/28/2015 0923   BILITOT 0.7 09/07/2016 0836   BILITOT 0.9 09/16/2015 0843   BILITOT 0.6 03/28/2015 0923     IMPRESSION: 1. Progressive metastatic breast cancer, now  with extensive involvement of the right pleural space and associated with a moderate to large right pleural effusion. 2. The left subpectoral and axillary lymph nodes have enlarged and are irregular. 3.  There is evidence of diffuse osseous metastatic disease, similar to prior PET-CT, but the only active osseous lesion on prior PET-CT was the S1 metastatic lesion. 4. Hepatic cirrhosis, with bandlike infiltrative hypodensity in the right hepatic lobe and medial segment left hepatic lobe anteriorly probably from fatty infiltration. 5. Other imaging findings of potential clinical significance: Cardiomegaly. Coronary, aortic arch, and branch vessel atherosclerotic vascular disease. Aortoiliac atherosclerotic vascular disease. Peripheral fibrosis in the lungs.   Electronically Signed   By: Van Clines M.D.   On: 08/31/2016 13:24  RADIOGRAPHIC STUDIES: I have personally reviewed the radiological images as listed and agreed with the findings in the report. No results found.   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 CT scan shows large right-sided pleural effusion progressive disease.   # Recommend starting Taxol Herceptin weekly; most recent MUGA scan March 2017 60%. Will order MUGA scan also.   # Right-sided pleural effusion status post thoracentesis. Likely malignant; although cytology negative.    # Bone metastases on Xgeva.  # Start chemotherapy next week; labs weekly follow-up with me in 3 weeks- with treatment #3. Labs.    Orders Placed This Encounter  Procedures  . NM Cardiac [place] Rest    Standing Status:   Future    Standing Expiration Date:   09/07/2017    Order Specific Question:   Reason for Exam (SYMPTOM  OR DIAGNOSIS REQUIRED)    Answer:   Breast cancer cardiotoxic chemotherapy    Order Specific Question:   Preferred imaging location?    Answer:   [facility]    Order Specific Question:   If indicated for the ordered procedure, I authorize the administration of a radiopharmaceutical per Radiology protocol    Answer:   Yes  . CBC with Differential     Standing Status:   Future    Standing Expiration Date:   09/07/2017  . Comprehensive metabolic panel    Standing Status:   Future    Standing Expiration Date:   09/07/2017  . CBC with Differential    Standing Status:   Future    Standing Expiration Date:   09/07/2017  . Basic metabolic panel    Standing Status:   Future    Standing Expiration Date:   09/07/2017   All questions were answered. The patient knows to call the clinic with any problems, questions or concerns.      Cammie Sickle, MD 09/07/2016 5:38 PM

## 2016-09-07 NOTE — Assessment & Plan Note (Addendum)
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 CT scan shows large right-sided pleural effusion progressive disease. Discussed the patient and granddaughters regarding the progression of disease; and the need to change therapy.  # Recommend starting Taxol Herceptin weekly; most recent MUGA scan March 2017 60%. Will order MUGA scan also. Again discussed the palliative nature of the treatments.  # Right-sided pleural effusion status post thoracentesis. Likely malignant; although cytology negative.    # Bone metastases on Xgeva.  # Start chemotherapy next week; labs weekly follow-up with me in 3 weeks- with treatment #3. Labs.   I reviewed the images myself and with the patient and family in detail.

## 2016-09-07 NOTE — Progress Notes (Signed)
START ON PATHWAY REGIMEN - Breast  [id number]: Trastuzumab 6/4 mg/kg D1, 15 + Paclitaxel 80 mg/m2 D1, 8, 15 q28 Days Until Progression or Toxicity   A cycle is every 28 days:     Paclitaxel (Taxol(R)) 80 mg/m2 in 250 mL NS IV over 1 hour on days 1, 8 and 15. Dose Mod: None     Trastuzumab (Herceptin(R)) 6 mg/kg in 250 mL NS IV over 90 minutes on day 1 cycle 1 only. (loading dose). Dose Mod: None     Trastuzumab (Herceptin(R)) 4 mg/kg in 250 mL NS IV over 30 minutes on day 15 only of cycle 1 and days 1 and 15 of cycle 2 and beyond. Dose Mod: None Additional Orders: Obtain LVEF at baseline and at regular intervals during trastuzumab treatment or with the development of cardiac symptoms - see PI for details. Ref: Freddi Che AD et al. J Clin Oncol 2001;19(10):2587-2595. (Dose modification by committee  recommendation.)  **Always confirm dose/schedule in your pharmacy ordering system**    Patient Characteristics: Metastatic Chemotherapy, HER2/neu Positive, ER +, Fourth Line and Beyond AJCC Stage Grouping: IV Current Disease Status: Distant Metastases AJCC M Stage: X ER Status: Positive (+) AJCC N Stage: X AJCC T Stage: X HER2/neu: Positive (+) PR Status: Positive (+) Line of therapy: Fourth Line and Beyond Would you be surprised if this patient died  in the next year? I would be surprised if this patient died in the next year  Intent of Therapy: Non-Curative / Palliative Intent, Discussed with Patient

## 2016-09-08 LAB — CANCER ANTIGEN 27.29: CA 27.29: 77.9 U/mL — AB (ref 0.0–38.6)

## 2016-09-10 ENCOUNTER — Other Ambulatory Visit: Payer: Self-pay | Admitting: Internal Medicine

## 2016-09-12 ENCOUNTER — Encounter
Admission: RE | Admit: 2016-09-12 | Discharge: 2016-09-12 | Disposition: A | Discharge status: Home / Self Care | Payer: Medicare Other | Source: Ambulatory Visit | Attending: Internal Medicine | Admitting: Internal Medicine

## 2016-09-12 DIAGNOSIS — C50919 Malignant neoplasm of unspecified site of unspecified female breast: Secondary | ICD-10-CM | POA: Diagnosis not present

## 2016-09-12 DIAGNOSIS — C50412 Malignant neoplasm of upper-outer quadrant of left female breast: Secondary | ICD-10-CM | POA: Insufficient documentation

## 2016-09-12 DIAGNOSIS — T451X5A Adverse effect of antineoplastic and immunosuppressive drugs, initial encounter: Secondary | ICD-10-CM | POA: Diagnosis not present

## 2016-09-12 MED ORDER — TECHNETIUM TC 99M-LABELED RED BLOOD CELLS IV KIT
22.0000 | PACK | Freq: Once | INTRAVENOUS | Status: AC | PRN
  Administered 2016-09-12: 20.935 via INTRAVENOUS

## 2016-09-14 ENCOUNTER — Other Ambulatory Visit: Payer: Self-pay

## 2016-09-14 ENCOUNTER — Encounter: Payer: Self-pay | Admitting: Unknown Physician Specialty

## 2016-09-14 ENCOUNTER — Inpatient Hospital Stay: Payer: Medicare Other

## 2016-09-14 ENCOUNTER — Ambulatory Visit (INDEPENDENT_AMBULATORY_CARE_PROVIDER_SITE_OTHER): Payer: Medicare Other | Admitting: Unknown Physician Specialty

## 2016-09-14 VITALS — BP 138/76 | HR 112 | Temp 97.5°F | Ht 63.0 in | Wt 144.0 lb

## 2016-09-14 VITALS — BP 122/76 | HR 79 | Resp 20

## 2016-09-14 DIAGNOSIS — Z23 Encounter for immunization: Secondary | ICD-10-CM | POA: Diagnosis not present

## 2016-09-14 DIAGNOSIS — C7951 Secondary malignant neoplasm of bone: Secondary | ICD-10-CM | POA: Diagnosis not present

## 2016-09-14 DIAGNOSIS — I129 Hypertensive chronic kidney disease with stage 1 through stage 4 chronic kidney disease, or unspecified chronic kidney disease: Secondary | ICD-10-CM

## 2016-09-14 DIAGNOSIS — I493 Ventricular premature depolarization: Secondary | ICD-10-CM | POA: Diagnosis not present

## 2016-09-14 DIAGNOSIS — C7800 Secondary malignant neoplasm of unspecified lung: Secondary | ICD-10-CM | POA: Diagnosis not present

## 2016-09-14 DIAGNOSIS — I10 Essential (primary) hypertension: Secondary | ICD-10-CM

## 2016-09-14 DIAGNOSIS — C50412 Malignant neoplasm of upper-outer quadrant of left female breast: Secondary | ICD-10-CM

## 2016-09-14 DIAGNOSIS — E785 Hyperlipidemia, unspecified: Secondary | ICD-10-CM | POA: Diagnosis not present

## 2016-09-14 DIAGNOSIS — C50919 Malignant neoplasm of unspecified site of unspecified female breast: Secondary | ICD-10-CM

## 2016-09-14 DIAGNOSIS — Z5111 Encounter for antineoplastic chemotherapy: Secondary | ICD-10-CM | POA: Diagnosis not present

## 2016-09-14 DIAGNOSIS — E1122 Type 2 diabetes mellitus with diabetic chronic kidney disease: Secondary | ICD-10-CM

## 2016-09-14 DIAGNOSIS — Z170 Estrogen receptor positive status [ER+]: Secondary | ICD-10-CM | POA: Diagnosis not present

## 2016-09-14 DIAGNOSIS — Z5112 Encounter for antineoplastic immunotherapy: Secondary | ICD-10-CM | POA: Diagnosis not present

## 2016-09-14 LAB — BAYER DCA HB A1C WAIVED: HB A1C: 8 % — AB (ref <7.0)

## 2016-09-14 LAB — HEMOGLOBIN A1C

## 2016-09-14 MED ORDER — DIPHENHYDRAMINE HCL 50 MG/ML IJ SOLN
50.0000 mg | Freq: Once | INTRAMUSCULAR | Status: AC
  Administered 2016-09-14: 50 mg via INTRAVENOUS
  Filled 2016-09-14: qty 1

## 2016-09-14 MED ORDER — SODIUM CHLORIDE 0.9% FLUSH
10.0000 mL | INTRAVENOUS | Status: DC | PRN
  Administered 2016-09-14: 10 mL
  Filled 2016-09-14: qty 10

## 2016-09-14 MED ORDER — PACLITAXEL CHEMO INJECTION 300 MG/50ML
80.0000 mg/m2 | Freq: Once | INTRAVENOUS | Status: AC
  Administered 2016-09-14: 138 mg via INTRAVENOUS
  Filled 2016-09-14: qty 23

## 2016-09-14 MED ORDER — TRASTUZUMAB CHEMO 150 MG IV SOLR
4.0000 mg/kg | Freq: Once | INTRAVENOUS | Status: AC
  Administered 2016-09-14: 273 mg via INTRAVENOUS
  Filled 2016-09-14: qty 13

## 2016-09-14 MED ORDER — HEPARIN SOD (PORK) LOCK FLUSH 100 UNIT/ML IV SOLN
500.0000 [IU] | Freq: Once | INTRAVENOUS | Status: AC | PRN
  Administered 2016-09-14: 500 [IU]
  Filled 2016-09-14: qty 5

## 2016-09-14 MED ORDER — SODIUM CHLORIDE 0.9 % IV SOLN
20.0000 mg | Freq: Once | INTRAVENOUS | Status: AC
  Administered 2016-09-14: 20 mg via INTRAVENOUS
  Filled 2016-09-14: qty 2

## 2016-09-14 MED ORDER — SODIUM CHLORIDE 0.9 % IV SOLN
Freq: Once | INTRAVENOUS | Status: AC
  Administered 2016-09-14: 11:00:00 via INTRAVENOUS
  Filled 2016-09-14: qty 1000

## 2016-09-14 MED ORDER — FAMOTIDINE IN NACL 20-0.9 MG/50ML-% IV SOLN
20.0000 mg | Freq: Once | INTRAVENOUS | Status: AC
  Administered 2016-09-14: 20 mg via INTRAVENOUS
  Filled 2016-09-14: qty 50

## 2016-09-14 MED ORDER — ACETAMINOPHEN 325 MG PO TABS
650.0000 mg | ORAL_TABLET | Freq: Once | ORAL | Status: AC
  Administered 2016-09-14: 650 mg via ORAL
  Filled 2016-09-14: qty 2

## 2016-09-14 MED ORDER — METFORMIN HCL 500 MG PO TABS: 1000.0000 mg | ORAL_TABLET | Freq: Two times a day (BID) | ORAL | 5 refills | Status: AC

## 2016-09-14 NOTE — Progress Notes (Signed)
BP 138/76 (BP Location: Left Arm, Cuff Size: Large)   Pulse (!) 112   Temp 97.5 F (36.4 C)   Ht 5\' 3"  (1.6 m)   Wt 144 lb (65.3 kg)   LMP  (LMP Unknown)   SpO2 92%   BMI 25.51 kg/m    Subjective:    Patient ID: Tammy Pope, female    DOB: 1945/12/24, 70 y.o.   MRN: [id number]  HPI: Tammy Pope is a 70 y.o. female  Chief Complaint  Patient presents with  . Diabetes    pt states her last eye exam was a few years ago  . Hyperlipidemia  . Hypertension   Recently finished chemotherapy  Diabetes: Using medications without difficulties No hypoglycemic episodes No hyperglycemic episodes Feet problems:none Blood Sugars averaging:around 120s eye exam within last year Last Hgb A1C: 6.8  Hypertension  Using medications without difficulty Average home BPs   Using medication without problems or lightheadedness No chest pain with exertion or shortness of breath No Edema  Elevated Cholesterol Using medications without problems No Muscle aches  Diet: regular.  Tries to watch what she eats Exercise: walking in yard  Relevant past medical, surgical, family and social history reviewed and updated as indicated. Interim medical history since our last visit reviewed. Allergies and medications reviewed and updated.  Review of Systems  Per HPI unless specifically indicated above     Objective:    BP 138/76 (BP Location: Left Arm, Cuff Size: Large)   Pulse (!) 112   Temp 97.5 F (36.4 C)   Ht 5\' 3"  (1.6 m)   Wt 144 lb (65.3 kg)   LMP  (LMP Unknown)   SpO2 92%   BMI 25.51 kg/m   Wt Readings from Last 3 Encounters:  09/14/16 144 lb (65.3 kg)  09/07/16 146 lb 9.7 oz (66.5 kg)  08/17/16 144 lb 8 oz (65.5 kg)    Physical Exam  Constitutional: She is oriented to person, place, and time. She appears well-developed and well-nourished. No distress.  HENT:  Head: Normocephalic and atraumatic.  Eyes: Conjunctivae and lids are normal. Right eye exhibits no  discharge. Left eye exhibits no discharge. No scleral icterus.  Neck: Normal range of motion. Neck supple. No JVD present. Carotid bruit is not present.  Cardiovascular: Normal rate and normal heart sounds.  An irregular rhythm present.  EKG shows PVCs  Pulmonary/Chest: Effort normal and breath sounds normal.  Abdominal: Normal appearance. There is no splenomegaly or hepatomegaly.  Musculoskeletal: Normal range of motion.  Neurological: She is alert and oriented to person, place, and time.  Skin: Skin is warm, dry and intact. No rash noted. No pallor.  Psychiatric: She has a normal mood and affect. Her behavior is normal. Judgment and thought content normal.    Results for orders placed or performed in visit on 09/07/16  CBC with Differential  Result Value Ref Range   WBC 5.6 3.6 - 11.0 K/uL   RBC 4.30 3.80 - 5.20 MIL/uL   Hemoglobin 12.3 12.0 - 16.0 g/dL   HCT 36.4 35.0 - 47.0 %   MCV 84.6 80.0 - 100.0 fL   MCH 28.6 26.0 - 34.0 pg   MCHC 33.8 32.0 - 36.0 g/dL   RDW 17.0 (H) 11.5 - 14.5 %   Platelets 158 150 - 440 K/uL   Neutrophils Relative % 63 %   Neutro Abs 3.6 1.4 - 6.5 K/uL   Lymphocytes Relative 20 %   Lymphs Abs 1.1 1.0 -  3.6 K/uL   Monocytes Relative 10 %   Monocytes Absolute 0.5 0.2 - 0.9 K/uL   Eosinophils Relative 6 %   Eosinophils Absolute 0.3 0 - 0.7 K/uL   Basophils Relative 1 %   Basophils Absolute 0.1 0 - 0.1 K/uL  Comprehensive metabolic panel  Result Value Ref Range   Sodium 139 135 - 145 mmol/L   Potassium 3.6 3.5 - 5.1 mmol/L   Chloride 105 101 - 111 mmol/L   CO2 25 22 - 32 mmol/L   Glucose, Bld 227 (H) 65 - 99 mg/dL   BUN 11 6 - 20 mg/dL   Creatinine, Ser 0.57 0.44 - 1.00 mg/dL   Calcium 9.1 8.9 - 10.3 mg/dL   Total Protein 7.3 6.5 - 8.1 g/dL   Albumin 3.2 (L) 3.5 - 5.0 g/dL   AST 55 (H) 15 - 41 U/L   ALT 22 14 - 54 U/L   Alkaline Phosphatase 124 38 - 126 U/L   Total Bilirubin 0.7 0.3 - 1.2 mg/dL   GFR calc non Af Amer >60 >60 mL/min   GFR calc  Af Amer >60 >60 mL/min   Anion gap 9 5 - 15  Cancer antigen 27.29  Result Value Ref Range   CA 27.29 77.9 (H) 0.0 - 38.6 U/mL      Assessment & Plan:   Problem List Items Addressed This Visit      Unprioritized   Hyperlipidemia   Relevant Orders   Lipid Panel w/o Chol/HDL Ratio   Hypertension    Stable, continue present medications.        PVC (premature ventricular contraction)   Type 2 diabetes mellitus ([place])    Stop any sugar drinks.  Increase Metformin from 1,000 to 2,000/day      Relevant Medications   metFORMIN (GLUCOPHAGE) 500 MG tablet   Other Relevant Orders   Bayer DCA Hb A1c Waived    Other Visit Diagnoses    Need for influenza vaccination    -  Primary   Relevant Orders   Flu vaccine HIGH DOSE PF (Completed)        Follow up plan: Return in about 3 months (around 12/14/2016).

## 2016-09-14 NOTE — Patient Instructions (Signed)

## 2016-09-14 NOTE — Assessment & Plan Note (Signed)
Stable, continue present medications.   

## 2016-09-14 NOTE — Assessment & Plan Note (Addendum)
Stop any sugar drinks.  Increase Metformin from 1,000 to 2,000/day

## 2016-09-15 LAB — LIPID PANEL W/O CHOL/HDL RATIO
Cholesterol, Total: 160 mg/dL (ref 100–199)
HDL: 33 mg/dL — ABNORMAL LOW (ref >39)
LDL CALC: 94 mg/dL (ref 0–99)
Triglycerides: 165 mg/dL — ABNORMAL HIGH (ref 0–149)
VLDL Cholesterol Cal: 33 mg/dL (ref 5–40)

## 2016-09-18 ENCOUNTER — Other Ambulatory Visit: Payer: Self-pay | Admitting: Hematology and Oncology

## 2016-09-21 ENCOUNTER — Inpatient Hospital Stay: Payer: Medicare Other | Attending: Internal Medicine

## 2016-09-21 ENCOUNTER — Inpatient Hospital Stay: Payer: Medicare Other

## 2016-09-21 VITALS — BP 134/73 | HR 108 | Temp 97.0°F | Resp 18

## 2016-09-21 DIAGNOSIS — Z170 Estrogen receptor positive status [ER+]: Secondary | ICD-10-CM | POA: Diagnosis not present

## 2016-09-21 DIAGNOSIS — Z923 Personal history of irradiation: Secondary | ICD-10-CM | POA: Insufficient documentation

## 2016-09-21 DIAGNOSIS — Z5111 Encounter for antineoplastic chemotherapy: Secondary | ICD-10-CM | POA: Diagnosis not present

## 2016-09-21 DIAGNOSIS — Z5112 Encounter for antineoplastic immunotherapy: Secondary | ICD-10-CM | POA: Diagnosis not present

## 2016-09-21 DIAGNOSIS — C50412 Malignant neoplasm of upper-outer quadrant of left female breast: Secondary | ICD-10-CM | POA: Diagnosis not present

## 2016-09-21 DIAGNOSIS — Z7984 Long term (current) use of oral hypoglycemic drugs: Secondary | ICD-10-CM | POA: Insufficient documentation

## 2016-09-21 DIAGNOSIS — C7951 Secondary malignant neoplasm of bone: Secondary | ICD-10-CM | POA: Diagnosis not present

## 2016-09-21 DIAGNOSIS — Z7982 Long term (current) use of aspirin: Secondary | ICD-10-CM | POA: Insufficient documentation

## 2016-09-21 DIAGNOSIS — K7460 Unspecified cirrhosis of liver: Secondary | ICD-10-CM | POA: Insufficient documentation

## 2016-09-21 DIAGNOSIS — Z803 Family history of malignant neoplasm of breast: Secondary | ICD-10-CM | POA: Insufficient documentation

## 2016-09-21 DIAGNOSIS — I493 Ventricular premature depolarization: Secondary | ICD-10-CM

## 2016-09-21 DIAGNOSIS — J90 Pleural effusion, not elsewhere classified: Secondary | ICD-10-CM | POA: Diagnosis not present

## 2016-09-21 DIAGNOSIS — Z79810 Long term (current) use of selective estrogen receptor modulators (SERMs): Secondary | ICD-10-CM | POA: Diagnosis not present

## 2016-09-21 DIAGNOSIS — Z8041 Family history of malignant neoplasm of ovary: Secondary | ICD-10-CM | POA: Diagnosis not present

## 2016-09-21 DIAGNOSIS — Z800 Family history of malignant neoplasm of digestive organs: Secondary | ICD-10-CM | POA: Diagnosis not present

## 2016-09-21 DIAGNOSIS — I517 Cardiomegaly: Secondary | ICD-10-CM | POA: Diagnosis not present

## 2016-09-21 LAB — CBC WITH DIFFERENTIAL/PLATELET
Basophils Absolute: 0.1 10*3/uL (ref 0–0.1)
Basophils Relative: 1 %
EOS ABS: 0.4 10*3/uL (ref 0–0.7)
EOS PCT: 8 %
HCT: 35.9 % (ref 35.0–47.0)
HEMOGLOBIN: 11.9 g/dL — AB (ref 12.0–16.0)
LYMPHS ABS: 1.4 10*3/uL (ref 1.0–3.6)
Lymphocytes Relative: 31 %
MCH: 27.8 pg (ref 26.0–34.0)
MCHC: 33.2 g/dL (ref 32.0–36.0)
MCV: 83.6 fL (ref 80.0–100.0)
MONOS PCT: 6 %
Monocytes Absolute: 0.3 10*3/uL (ref 0.2–0.9)
Neutro Abs: 2.5 10*3/uL (ref 1.4–6.5)
Neutrophils Relative %: 54 %
PLATELETS: 228 10*3/uL (ref 150–440)
RBC: 4.29 MIL/uL (ref 3.80–5.20)
RDW: 18 % — ABNORMAL HIGH (ref 11.5–14.5)
WBC: 4.6 10*3/uL (ref 3.6–11.0)

## 2016-09-21 LAB — BASIC METABOLIC PANEL
Anion gap: 7 (ref 5–15)
BUN: 13 mg/dL (ref 6–20)
CHLORIDE: 107 mmol/L (ref 101–111)
CO2: 25 mmol/L (ref 22–32)
CREATININE: 0.66 mg/dL (ref 0.44–1.00)
Calcium: 8.9 mg/dL (ref 8.9–10.3)
GFR calc Af Amer: >60 mL/min (ref >60)
GFR calc non Af Amer: >60 mL/min (ref >60)
Glucose, Bld: 291 mg/dL — ABNORMAL HIGH (ref 65–99)
Potassium: 3.6 mmol/L (ref 3.5–5.1)
SODIUM: 139 mmol/L (ref 135–145)

## 2016-09-21 MED ORDER — HEPARIN SOD (PORK) LOCK FLUSH 100 UNIT/ML IV SOLN
500.0000 [IU] | Freq: Once | INTRAVENOUS | Status: AC | PRN
  Administered 2016-09-21: 500 [IU]
  Filled 2016-09-21: qty 5

## 2016-09-21 MED ORDER — PACLITAXEL CHEMO INJECTION 300 MG/50ML
80.0000 mg/m2 | Freq: Once | INTRAVENOUS | Status: AC
  Administered 2016-09-21: 138 mg via INTRAVENOUS
  Filled 2016-09-21: qty 23

## 2016-09-21 MED ORDER — SODIUM CHLORIDE 0.9 % IV SOLN
20.0000 mg | Freq: Once | INTRAVENOUS | Status: AC
  Administered 2016-09-21: 20 mg via INTRAVENOUS
  Filled 2016-09-21: qty 2

## 2016-09-21 MED ORDER — SODIUM CHLORIDE 0.9% FLUSH
10.0000 mL | INTRAVENOUS | Status: DC | PRN
  Administered 2016-09-21: 10 mL
  Filled 2016-09-21: qty 10

## 2016-09-21 MED ORDER — SODIUM CHLORIDE 0.9 % IV SOLN
Freq: Once | INTRAVENOUS | Status: AC
  Administered 2016-09-21: 10:00:00 via INTRAVENOUS
  Filled 2016-09-21: qty 1000

## 2016-09-21 MED ORDER — FAMOTIDINE IN NACL 20-0.9 MG/50ML-% IV SOLN
20.0000 mg | Freq: Once | INTRAVENOUS | Status: AC
  Administered 2016-09-21: 20 mg via INTRAVENOUS
  Filled 2016-09-21: qty 50

## 2016-09-21 MED ORDER — DIPHENHYDRAMINE HCL 50 MG/ML IJ SOLN
50.0000 mg | Freq: Once | INTRAMUSCULAR | Status: AC
  Administered 2016-09-21: 50 mg via INTRAVENOUS
  Filled 2016-09-21: qty 1

## 2016-09-21 MED ORDER — ACETAMINOPHEN 325 MG PO TABS
650.0000 mg | ORAL_TABLET | Freq: Once | ORAL | Status: AC
  Administered 2016-09-21: 650 mg via ORAL
  Filled 2016-09-21: qty 2

## 2016-09-21 MED ORDER — TRASTUZUMAB CHEMO 150 MG IV SOLR
2.0000 mg/kg | Freq: Once | INTRAVENOUS | Status: AC
  Administered 2016-09-21: 126 mg via INTRAVENOUS
  Filled 2016-09-21: qty 6

## 2016-09-28 ENCOUNTER — Other Ambulatory Visit: Payer: Self-pay

## 2016-09-28 ENCOUNTER — Inpatient Hospital Stay: Payer: Medicare Other

## 2016-09-28 ENCOUNTER — Inpatient Hospital Stay (HOSPITAL_BASED_OUTPATIENT_CLINIC_OR_DEPARTMENT_OTHER): Payer: Medicare Other | Admitting: Internal Medicine

## 2016-09-28 DIAGNOSIS — Z170 Estrogen receptor positive status [ER+]: Secondary | ICD-10-CM

## 2016-09-28 DIAGNOSIS — Z5111 Encounter for antineoplastic chemotherapy: Secondary | ICD-10-CM | POA: Diagnosis not present

## 2016-09-28 DIAGNOSIS — Z7982 Long term (current) use of aspirin: Secondary | ICD-10-CM

## 2016-09-28 DIAGNOSIS — Z800 Family history of malignant neoplasm of digestive organs: Secondary | ICD-10-CM

## 2016-09-28 DIAGNOSIS — Z923 Personal history of irradiation: Secondary | ICD-10-CM

## 2016-09-28 DIAGNOSIS — J90 Pleural effusion, not elsewhere classified: Secondary | ICD-10-CM

## 2016-09-28 DIAGNOSIS — C7951 Secondary malignant neoplasm of bone: Secondary | ICD-10-CM | POA: Diagnosis not present

## 2016-09-28 DIAGNOSIS — C50412 Malignant neoplasm of upper-outer quadrant of left female breast: Secondary | ICD-10-CM

## 2016-09-28 DIAGNOSIS — K7460 Unspecified cirrhosis of liver: Secondary | ICD-10-CM

## 2016-09-28 DIAGNOSIS — Z79810 Long term (current) use of selective estrogen receptor modulators (SERMs): Secondary | ICD-10-CM | POA: Diagnosis not present

## 2016-09-28 DIAGNOSIS — Z803 Family history of malignant neoplasm of breast: Secondary | ICD-10-CM

## 2016-09-28 DIAGNOSIS — Z5112 Encounter for antineoplastic immunotherapy: Secondary | ICD-10-CM | POA: Diagnosis not present

## 2016-09-28 DIAGNOSIS — Z7984 Long term (current) use of oral hypoglycemic drugs: Secondary | ICD-10-CM

## 2016-09-28 DIAGNOSIS — I517 Cardiomegaly: Secondary | ICD-10-CM

## 2016-09-28 DIAGNOSIS — Z8041 Family history of malignant neoplasm of ovary: Secondary | ICD-10-CM

## 2016-09-28 LAB — COMPREHENSIVE METABOLIC PANEL
ALT: 36 U/L (ref 14–54)
ANION GAP: 8 (ref 5–15)
AST: 48 U/L — AB (ref 15–41)
Albumin: 3.7 g/dL (ref 3.5–5.0)
Alkaline Phosphatase: 140 U/L — ABNORMAL HIGH (ref 38–126)
BUN: 10 mg/dL (ref 6–20)
CHLORIDE: 103 mmol/L (ref 101–111)
CO2: 27 mmol/L (ref 22–32)
Calcium: 9 mg/dL (ref 8.9–10.3)
Creatinine, Ser: 0.64 mg/dL (ref 0.44–1.00)
Glucose, Bld: 236 mg/dL — ABNORMAL HIGH (ref 65–99)
POTASSIUM: 3.7 mmol/L (ref 3.5–5.1)
Sodium: 138 mmol/L (ref 135–145)
TOTAL PROTEIN: 7.3 g/dL (ref 6.5–8.1)
Total Bilirubin: 0.8 mg/dL (ref 0.3–1.2)

## 2016-09-28 LAB — CBC WITH DIFFERENTIAL/PLATELET
BASOS ABS: 0 10*3/uL (ref 0–0.1)
Basophils Relative: 2 %
EOS PCT: 5 %
Eosinophils Absolute: 0.1 10*3/uL (ref 0–0.7)
HCT: 35.5 % (ref 35.0–47.0)
Hemoglobin: 11.6 g/dL — ABNORMAL LOW (ref 12.0–16.0)
LYMPHS PCT: 48 %
Lymphs Abs: 1.3 10*3/uL (ref 1.0–3.6)
MCH: 27.2 pg (ref 26.0–34.0)
MCHC: 32.8 g/dL (ref 32.0–36.0)
MCV: 82.8 fL (ref 80.0–100.0)
MONO ABS: 0.2 10*3/uL (ref 0.2–0.9)
MONOS PCT: 6 %
Neutro Abs: 1 10*3/uL — ABNORMAL LOW (ref 1.4–6.5)
Neutrophils Relative %: 39 %
PLATELETS: 205 10*3/uL (ref 150–440)
RBC: 4.29 MIL/uL (ref 3.80–5.20)
RDW: 17.8 % — AB (ref 11.5–14.5)
WBC: 2.7 10*3/uL — ABNORMAL LOW (ref 3.6–11.0)

## 2016-09-28 MED ORDER — FAMOTIDINE IN NACL 20-0.9 MG/50ML-% IV SOLN
20.0000 mg | Freq: Once | INTRAVENOUS | Status: AC
  Administered 2016-09-28: 20 mg via INTRAVENOUS
  Filled 2016-09-28: qty 50

## 2016-09-28 MED ORDER — DEXTROSE 5 % IV SOLN
80.0000 mg/m2 | Freq: Once | INTRAVENOUS | Status: AC
  Administered 2016-09-28: 138 mg via INTRAVENOUS
  Filled 2016-09-28: qty 23

## 2016-09-28 MED ORDER — DIPHENHYDRAMINE HCL 50 MG/ML IJ SOLN
50.0000 mg | Freq: Once | INTRAMUSCULAR | Status: AC
  Administered 2016-09-28: 50 mg via INTRAVENOUS
  Filled 2016-09-28: qty 1

## 2016-09-28 MED ORDER — SODIUM CHLORIDE 0.9 % IV SOLN
Freq: Once | INTRAVENOUS | Status: AC
  Administered 2016-09-28: 11:00:00 via INTRAVENOUS
  Filled 2016-09-28: qty 1000

## 2016-09-28 MED ORDER — ACETAMINOPHEN 325 MG PO TABS
650.0000 mg | ORAL_TABLET | Freq: Once | ORAL | Status: AC
  Administered 2016-09-28: 650 mg via ORAL
  Filled 2016-09-28: qty 2

## 2016-09-28 MED ORDER — HEPARIN SOD (PORK) LOCK FLUSH 100 UNIT/ML IV SOLN
500.0000 [IU] | Freq: Once | INTRAVENOUS | Status: AC | PRN
  Administered 2016-09-28: 500 [IU]
  Filled 2016-09-28: qty 5

## 2016-09-28 MED ORDER — SODIUM CHLORIDE 0.9 % IV SOLN
20.0000 mg | Freq: Once | INTRAVENOUS | Status: AC
  Administered 2016-09-28: 20 mg via INTRAVENOUS
  Filled 2016-09-28: qty 2

## 2016-09-28 MED ORDER — TRASTUZUMAB CHEMO 150 MG IV SOLR
2.0000 mg/kg | Freq: Once | INTRAVENOUS | Status: AC
  Administered 2016-09-28: 126 mg via INTRAVENOUS
  Filled 2016-09-28: qty 6

## 2016-09-28 NOTE — Progress Notes (Signed)
ANC =1.  Verified with MD ok to proceed with treatment.

## 2016-09-28 NOTE — Progress Notes (Signed)
[facility] OFFICE PROGRESS NOTE  Patient Care Team: Kathrine Haddock, NP as PCP - General (Nurse Practitioner) Robert Bellow, MD (General Surgery) Forest Gleason, MD (Oncology)  Breast cancer [facility])   Staging form: Breast, AJCC 7th Edition     Clinical stage from 04/06/2015: Stage IV (T2, N2, M1) - Signed by Evlyn Kanner, NP on 04/06/2015    Oncology History   1.history of carcinoma of right breast status post lumpectomy and radiation therapy (upper and outer quadrant) February 21, 2009.  Estrogen receptor 90%.  Progesterone receptor 60%.  Patient had lumpectomy and MammoSite partial breast radiation therapy followed by tamoxifen for 5 years.  Patient is still taking tamoxifen.. 2 recent mammogram in December of 2015 negative.  Further evaluation by surgeon revealed a palpable mass in the left upper outer quadrant near axilla.  Ultrasound revealed one small normal-appearing lymph node 0.6 cm in lower limit of axilla.  Adjacent to this there was ill-defined multilobulated hypoechoic mass 1.8 x 2.6 cm.  Biopsy of the left axillary mass was done which was positive for high-grade carcinoma.   2,estrogen receptor positive.  Progesterone receptor positive.  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 3.  MRI scan of breast (January, 2015) reveals multiple abnormal enlarged left axillary lymph node There are 2 oval some circumscribed mass at 2:30 and 3:00 position of the left breast.  0.6 cm x 1.3 cm and 4 x 7 mm Non-mass enhancement extending 2.4 cm these masses is 3.6 cm mass.  Findings suggestive of several pulmonary nodules.  PET  scan shows extensive disease, 4.started on Taxotere,perjeta, Herceptin(January 03, 2015- June 2016.   # June 2016- Herceptin [Letrozole + Ibrance] until May 2017.   # MAY 2017- PET PROGRESSION; # July 2017- START KADCYLA  x4 cycles- SEP 2017CT- PROGRESSION  # SEP 2017- START Taxol + Herceptin.   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)   09/28/2016 Initial Diagnosis    Carcinoma of upper-outer quadrant of left breast in female, estrogen receptor positive ([place])       INTERVAL HISTORY:  Tammy Pope 70 y.o.  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  Patient has good energy. Denies any easy bruising. Denies any bleeding. Denies any shortness of breath. No tingling and numbness.  Patient denies any nausea vomiting. Denies any bone pain. Denies any worsening swelling in in the legs. Her appetite is good. No weight loss. No fevers or chills.   REVIEW OF SYSTEMS:  A complete 10 point review of system is done which is negative except mentioned above/history of present illness.   PAST MEDICAL HISTORY :  Past Medical History:  Diagnosis Date  . Axillary mass 12/15/2014  . Breast cancer ([place]) 02/21/2009  .  Cancer [facility]) 2010   right breast ductal carcinoma in situ  . Floaters 2014  . Hyperlipidemia 1990  . Hypertension 2009  . Malignant neoplasm of axillary tail of female breast ([place]) 12/16/2014  . Malignant neoplasm of upper-outer quadrant of female breast [facility]) February 21, 2009   DCIS of the right breast, intermediate grade, resected the negative margins. ER 90%, PR 50%, MammoSite partial breast radiation  . 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 Special screening for malignant neoplasms, colon     PAST SURGICAL HISTORY :   Past Surgical History:  Procedure Laterality Date  . APPENDECTOMY  1956  . BREAST BIOPSY Right 2010   stereo  . BREAST SURGERY Right 2010   wide excision  . COLONOSCOPY  2008   ??? Md  . left axilla biopsy Left 12-14-14  . PARTIAL HYSTERECTOMY  1977    FAMILY HISTORY :   Family History  Problem Relation Age of Onset  . Cancer Other     unknown family members with ovarian,colon,breast cancers  . Hypertension Mother   . Heart disease Daughter     MI    SOCIAL HISTORY:   Social History  Substance Use Topics  . Smoking status: Never Smoker  . Smokeless tobacco: Never Used  . Alcohol use No    ALLERGIES:  is allergic to penicillin g and sulfa antibiotics.  MEDICATIONS:  Current Outpatient Prescriptions  Medication Sig Dispense Refill  . ALPRAZolam (XANAX) 0.5 MG tablet TAKE 1 TABLET BY MOUTH 3 TIMES A DAY AS NEEDED 90 tablet 1  . amLODipine (NORVASC) 5 MG tablet TAKE 1 TABLET (5 MG TOTAL) BY MOUTH DAILY. 30 tablet 3  . aspirin 81 MG tablet Take 81 mg by mouth daily.    . Calcium 600-200 MG-UNIT tablet Take 1 tablet by mouth 2 (two) times daily. 120 tablet 3  . metFORMIN (GLUCOPHAGE) 500 MG tablet Take 2 tablets (1,000 mg total) by mouth 2 (two) times daily with a meal. 360 tablet 5  . Multiple Vitamins-Minerals (MULTIVITAMIN ADULT PO) Take by mouth.    . ONE TOUCH ULTRA TEST test strip     . potassium chloride SA (KLOR-CON M20) 20 MEQ tablet Take 2 tablets (40 mEq total) by mouth daily. 60 tablet 3  . rosuvastatin (CRESTOR) 5 MG tablet TAKE 1 TABLET (5 MG TOTAL) BY MOUTH DAILY. 90 tablet 1  . valsartan-hydrochlorothiazide (DIOVAN-HCT) 320-25 MG tablet TAKE 1 TABLET BY MOUTH DAILY 30 tablet 6   No current facility-administered medications for this visit.    Facility-Administered Medications Ordered in Other Visits  Medication Dose Route Frequency Provider Last Rate Last Dose   . heparin lock flush 100 unit/mL  250 Units Intracatheter PRN Forest Gleason, MD      . sodium chloride 0.9 % 250 mL with potassium chloride 40 mEq infusion   Intravenous Continuous Forest Gleason, MD   Stopped at 05/03/15 1229  . sodium chloride 0.9 % injection 10 mL  10 mL Intracatheter PRN Forest Gleason, MD   10 mL at 04/18/15 1545    PHYSICAL EXAMINATION: ECOG PERFORMANCE STATUS: 0 - Asymptomatic  BP (!) 143/78 (Patient Position: Sitting)   Pulse 91   Temp 97.6 F (36.4 C) (Tympanic)   Resp 20   Ht '5\' 3"'  (1.6 m)   Wt 144 lb (65.3 kg)   LMP  (LMP Unknown)   BMI 25.51 kg/m   Filed Weights   09/28/16 0930  Weight: 144  lb (65.3 kg)    GENERAL: Well-nourished well-developed; Alert, no distress and comfortable.   Accompanied by her husband. EYES: no pallor or icterus OROPHARYNX: no thrush or ulceration; poor dentition  NECK: supple, no masses felt LYMPH:  no palpable lymphadenopathy in the cervical, axillary or inguinal regions LUNGS: clear to auscultation and  No wheeze or crackles HEART/CVS: regular rate & rhythm and no murmurs; No lower extremity edema ABDOMEN:abdomen soft, non-tender and normal bowel sounds Musculoskeletal:no cyanosis of digits and no clubbing  PSYCH: alert & oriented x 3 with fluent speech NEURO: no focal motor/sensory deficits SKIN:  no rashes or significant lesions LEFT BREAST- 1-2cm mass left upper outer quadrant/axillary tail. Nontender.  LABORATORY DATA:  I have reviewed the data as listed    Component Value Date/Time   NA 138 09/28/2016 0852   NA 136 09/16/2015 0843   NA 138 03/28/2015 0923   K 3.7 09/28/2016 0852   K 3.3 (L) 03/28/2015 0923   CL 103 09/28/2016 0852   CL 101 03/28/2015 0923   CO2 27 09/28/2016 0852   CO2 28 03/28/2015 0923   GLUCOSE 236 (H) 09/28/2016 0852   GLUCOSE 173 (H) 03/28/2015 0923   BUN 10 09/28/2016 0852   BUN 10 09/16/2015 0843   BUN 9 03/28/2015 0923   CREATININE 0.64 09/28/2016 0852   CREATININE 0.56  03/28/2015 0923   CALCIUM 9.0 09/28/2016 0852   CALCIUM 8.6 (L) 03/28/2015 0923   PROT 7.3 09/28/2016 0852   PROT 7.2 09/16/2015 0843   PROT 7.1 03/28/2015 0923   ALBUMIN 3.7 09/28/2016 0852   ALBUMIN 4.6 09/16/2015 0843   ALBUMIN 3.6 03/28/2015 0923   AST 48 (H) 09/28/2016 0852   AST 43 (H) 03/28/2015 0923   ALT 36 09/28/2016 0852   ALT 26 03/28/2015 0923   ALKPHOS 140 (H) 09/28/2016 0852   ALKPHOS 85 03/28/2015 0923   BILITOT 0.8 09/28/2016 0852   BILITOT 0.9 09/16/2015 0843   BILITOT 0.6 03/28/2015 0923   GFRNONAA >60 09/28/2016 0852   GFRNONAA >60 03/28/2015 0923   GFRAA >60 09/28/2016 0852   GFRAA >60 03/28/2015 0923    No results found for: SPEP, UPEP  Lab Results  Component Value Date   WBC 2.7 (L) 09/28/2016   NEUTROABS 1.0 (L) 09/28/2016   HGB 11.6 (L) 09/28/2016   HCT 35.5 09/28/2016   MCV 82.8 09/28/2016   PLT 205 09/28/2016      Chemistry      Component Value Date/Time   NA 138 09/28/2016 0852   NA 136 09/16/2015 0843   NA 138 03/28/2015 0923   K 3.7 09/28/2016 0852   K 3.3 (L) 03/28/2015 0923   CL 103 09/28/2016 0852   CL 101 03/28/2015 0923   CO2 27 09/28/2016 0852   CO2 28 03/28/2015 0923   BUN 10 09/28/2016 0852   BUN 10 09/16/2015 0843   BUN 9 03/28/2015 0923   CREATININE 0.64 09/28/2016 0852   CREATININE 0.56 03/28/2015 0923      Component Value Date/Time   CALCIUM 9.0 09/28/2016 0852   CALCIUM 8.6 (L) 03/28/2015 0923   ALKPHOS 140 (H) 09/28/2016 0852   ALKPHOS 85 03/28/2015 0923   AST 48 (H) 09/28/2016 0852   AST 43 (H) 03/28/2015 0923   ALT 36 09/28/2016 0852   ALT 26 03/28/2015 0923   BILITOT 0.8 09/28/2016 0852   BILITOT 0.9 09/16/2015 0843   BILITOT 0.6 03/28/2015 0923     IMPRESSION: 1. Progressive metastatic breast  cancer, now with extensive involvement of the right pleural space and associated with a moderate to large right pleural effusion. 2. The left subpectoral and axillary lymph nodes have enlarged and are  irregular. 3. There is evidence of diffuse osseous metastatic disease, similar to prior PET-CT, but the only active osseous lesion on prior PET-CT was the S1 metastatic lesion. 4. Hepatic cirrhosis, with bandlike infiltrative hypodensity in the right hepatic lobe and medial segment left hepatic lobe anteriorly probably from fatty infiltration. 5. Other imaging findings of potential clinical significance: Cardiomegaly. Coronary, aortic arch, and branch vessel atherosclerotic vascular disease. Aortoiliac atherosclerotic vascular disease. Peripheral fibrosis in the lungs.   Electronically Signed   By: Van Clines M.D.   On: 08/31/2016 13:24  RADIOGRAPHIC STUDIES: I have personally reviewed the radiological images as listed and agreed with the findings in the report. No results found.   ASSESSMENT & PLAN:  Carcinoma of upper-outer quadrant of left breast in female, estrogen receptor positive ([place]) Metastatic breast cancer to the bone/lung. Sep 2017- CT scan shows large right-sided pleural effusion progressive disease.On taxol- Herceptin cycle #1 day 8 1 week ago. No evidence of progression.   # proceed with cycle #1 day 15 today. MUGA scan- 69% [sep 27th]  # Right-sided pleural effusion status post thoracentesis. Likely malignant; although cytology negative. Clinically not worse at this time.   # Bone metastases on Xgeva.  # follow up in 3  weeks/ labs/MD; weekly labs/ treatment.   Orders Placed This Encounter  Procedures  . CBC with Differential    Standing Status:   Future    Standing Expiration Date:   09/28/2017  . Comprehensive metabolic panel    Standing Status:   Future    Standing Expiration Date:   09/28/2017  . Cancer antigen 27.29    Standing Status:   Future    Standing Expiration Date:   09/28/2017  . CBC with Differential    Standing Status:   Future    Standing Expiration Date:   09/28/2017  . Basic metabolic panel    Standing Status:   Future     Standing Expiration Date:   09/28/2017  . CBC with Differential    Standing Status:   Future    Standing Expiration Date:   09/28/2017  . Basic metabolic panel    Standing Status:   Future    Standing Expiration Date:   09/28/2017  . CBC with Differential    Standing Status:   Future    Standing Expiration Date:   09/28/2017  . Basic metabolic panel    Standing Status:   Future    Standing Expiration Date:   09/28/2017       Cammie Sickle, MD 09/29/2016 10:58 AM

## 2016-09-28 NOTE — Progress Notes (Signed)
Patient here for follow-up with breast cancer. Patient denies any medical concerns today.

## 2016-09-28 NOTE — Assessment & Plan Note (Addendum)
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 Sep 2017- CT scan shows large right-sided pleural effusion progressive disease.On taxol- Herceptin cycle #1 day 8 1 week ago. No evidence of progression.   # proceed with cycle #1 day 15 today. MUGA scan- 69% [sep 27th]  # Right-sided pleural effusion status post thoracentesis. Likely malignant; although cytology negative. Clinically not worse at this time.   # Bone metastases on Xgeva.  # follow up in 3  weeks/ labs/MD; weekly labs/ treatment.

## 2016-10-05 ENCOUNTER — Inpatient Hospital Stay: Payer: Medicare Other

## 2016-10-05 VITALS — BP 135/74 | HR 85 | Temp 96.4°F | Resp 18

## 2016-10-05 DIAGNOSIS — Z79810 Long term (current) use of selective estrogen receptor modulators (SERMs): Secondary | ICD-10-CM | POA: Diagnosis not present

## 2016-10-05 DIAGNOSIS — Z170 Estrogen receptor positive status [ER+]: Secondary | ICD-10-CM | POA: Diagnosis not present

## 2016-10-05 DIAGNOSIS — Z5112 Encounter for antineoplastic immunotherapy: Secondary | ICD-10-CM | POA: Diagnosis not present

## 2016-10-05 DIAGNOSIS — C7951 Secondary malignant neoplasm of bone: Secondary | ICD-10-CM | POA: Diagnosis not present

## 2016-10-05 DIAGNOSIS — C50412 Malignant neoplasm of upper-outer quadrant of left female breast: Secondary | ICD-10-CM

## 2016-10-05 DIAGNOSIS — Z5111 Encounter for antineoplastic chemotherapy: Secondary | ICD-10-CM | POA: Diagnosis not present

## 2016-10-05 LAB — COMPREHENSIVE METABOLIC PANEL
ALBUMIN: 3.6 g/dL (ref 3.5–5.0)
ALK PHOS: 129 U/L — AB (ref 38–126)
ALT: 34 U/L (ref 14–54)
AST: 43 U/L — AB (ref 15–41)
Anion gap: 9 (ref 5–15)
BILIRUBIN TOTAL: 0.9 mg/dL (ref 0.3–1.2)
BUN: 10 mg/dL (ref 6–20)
CALCIUM: 8.8 mg/dL — AB (ref 8.9–10.3)
CO2: 24 mmol/L (ref 22–32)
CREATININE: 0.54 mg/dL (ref 0.44–1.00)
Chloride: 104 mmol/L (ref 101–111)
GFR calc Af Amer: >60 mL/min (ref >60)
GLUCOSE: 244 mg/dL — AB (ref 65–99)
POTASSIUM: 3.3 mmol/L — AB (ref 3.5–5.1)
Sodium: 137 mmol/L (ref 135–145)
TOTAL PROTEIN: 7.1 g/dL (ref 6.5–8.1)

## 2016-10-05 LAB — CBC WITH DIFFERENTIAL/PLATELET
BASOS ABS: 0 10*3/uL (ref 0–0.1)
BASOS PCT: 1 %
Eosinophils Absolute: 0 10*3/uL (ref 0–0.7)
Eosinophils Relative: 2 %
HEMATOCRIT: 32.3 % — AB (ref 35.0–47.0)
HEMOGLOBIN: 10.8 g/dL — AB (ref 12.0–16.0)
LYMPHS PCT: 41 %
Lymphs Abs: 1.1 10*3/uL (ref 1.0–3.6)
MCH: 27.6 pg (ref 26.0–34.0)
MCHC: 33.4 g/dL (ref 32.0–36.0)
MCV: 82.7 fL (ref 80.0–100.0)
MONO ABS: 0.2 10*3/uL (ref 0.2–0.9)
Monocytes Relative: 7 %
NEUTROS ABS: 1.4 10*3/uL (ref 1.4–6.5)
NEUTROS PCT: 49 %
Platelets: 183 10*3/uL (ref 150–440)
RBC: 3.91 MIL/uL (ref 3.80–5.20)
RDW: 18 % — ABNORMAL HIGH (ref 11.5–14.5)
WBC: 2.8 10*3/uL — ABNORMAL LOW (ref 3.6–11.0)

## 2016-10-05 MED ORDER — ACETAMINOPHEN 325 MG PO TABS
650.0000 mg | ORAL_TABLET | Freq: Once | ORAL | Status: AC
  Administered 2016-10-05: 650 mg via ORAL
  Filled 2016-10-05: qty 2

## 2016-10-05 MED ORDER — TRASTUZUMAB CHEMO 150 MG IV SOLR
2.0000 mg/kg | Freq: Once | INTRAVENOUS | Status: AC
  Administered 2016-10-05: 126 mg via INTRAVENOUS
  Filled 2016-10-05: qty 6

## 2016-10-05 MED ORDER — PACLITAXEL CHEMO INJECTION 300 MG/50ML
80.0000 mg/m2 | Freq: Once | INTRAVENOUS | Status: AC
  Administered 2016-10-05: 138 mg via INTRAVENOUS
  Filled 2016-10-05: qty 23

## 2016-10-05 MED ORDER — HEPARIN SOD (PORK) LOCK FLUSH 100 UNIT/ML IV SOLN
500.0000 [IU] | Freq: Once | INTRAVENOUS | Status: AC | PRN
  Administered 2016-10-05: 500 [IU]
  Filled 2016-10-05: qty 5

## 2016-10-05 MED ORDER — DIPHENHYDRAMINE HCL 50 MG/ML IJ SOLN
50.0000 mg | Freq: Once | INTRAMUSCULAR | Status: AC
Start: 2016-10-05 — End: 2016-10-05
  Administered 2016-10-05: 50 mg via INTRAVENOUS
  Filled 2016-10-05: qty 1

## 2016-10-05 MED ORDER — SODIUM CHLORIDE 0.9 % IV SOLN
20.0000 mg | Freq: Once | INTRAVENOUS | Status: AC
  Administered 2016-10-05: 20 mg via INTRAVENOUS
  Filled 2016-10-05: qty 2

## 2016-10-05 MED ORDER — FAMOTIDINE IN NACL 20-0.9 MG/50ML-% IV SOLN
20.0000 mg | Freq: Once | INTRAVENOUS | Status: AC
Start: 2016-10-05 — End: 2016-10-05
  Administered 2016-10-05: 20 mg via INTRAVENOUS
  Filled 2016-10-05: qty 50

## 2016-10-05 MED ORDER — SODIUM CHLORIDE 0.9 % IV SOLN
Freq: Once | INTRAVENOUS | Status: AC
  Administered 2016-10-05: 10:00:00 via INTRAVENOUS
  Filled 2016-10-05: qty 1000

## 2016-10-12 ENCOUNTER — Inpatient Hospital Stay: Payer: Medicare Other

## 2016-10-12 ENCOUNTER — Other Ambulatory Visit: Payer: Self-pay | Admitting: Oncology

## 2016-10-12 VITALS — BP 135/74 | HR 82 | Temp 97.2°F | Resp 18

## 2016-10-12 DIAGNOSIS — C7951 Secondary malignant neoplasm of bone: Secondary | ICD-10-CM | POA: Diagnosis not present

## 2016-10-12 DIAGNOSIS — C50919 Malignant neoplasm of unspecified site of unspecified female breast: Secondary | ICD-10-CM

## 2016-10-12 DIAGNOSIS — Z5112 Encounter for antineoplastic immunotherapy: Secondary | ICD-10-CM | POA: Diagnosis not present

## 2016-10-12 DIAGNOSIS — Z170 Estrogen receptor positive status [ER+]: Secondary | ICD-10-CM

## 2016-10-12 DIAGNOSIS — Z5111 Encounter for antineoplastic chemotherapy: Secondary | ICD-10-CM | POA: Diagnosis not present

## 2016-10-12 DIAGNOSIS — C50412 Malignant neoplasm of upper-outer quadrant of left female breast: Secondary | ICD-10-CM

## 2016-10-12 DIAGNOSIS — Z79810 Long term (current) use of selective estrogen receptor modulators (SERMs): Secondary | ICD-10-CM | POA: Diagnosis not present

## 2016-10-12 LAB — CBC WITH DIFFERENTIAL/PLATELET
BASOS PCT: 0 %
Basophils Absolute: 0 10*3/uL (ref 0–0.1)
EOS ABS: 0 10*3/uL (ref 0–0.7)
Eosinophils Relative: 1 %
HEMATOCRIT: 33.8 % — AB (ref 35.0–47.0)
HEMOGLOBIN: 11.2 g/dL — AB (ref 12.0–16.0)
LYMPHS ABS: 1.4 10*3/uL (ref 1.0–3.6)
Lymphocytes Relative: 41 %
MCH: 27.3 pg (ref 26.0–34.0)
MCHC: 33.1 g/dL (ref 32.0–36.0)
MCV: 82.5 fL (ref 80.0–100.0)
MONOS PCT: 7 %
Monocytes Absolute: 0.3 10*3/uL (ref 0.2–0.9)
NEUTROS ABS: 1.8 10*3/uL (ref 1.4–6.5)
NEUTROS PCT: 51 %
Platelets: 186 10*3/uL (ref 150–440)
RBC: 4.09 MIL/uL (ref 3.80–5.20)
RDW: 18.5 % — ABNORMAL HIGH (ref 11.5–14.5)
WBC: 3.5 10*3/uL — AB (ref 3.6–11.0)

## 2016-10-12 LAB — BASIC METABOLIC PANEL
Anion gap: 10 (ref 5–15)
BUN: 9 mg/dL (ref 6–20)
CHLORIDE: 103 mmol/L (ref 101–111)
CO2: 25 mmol/L (ref 22–32)
CREATININE: 0.59 mg/dL (ref 0.44–1.00)
Calcium: 8.8 mg/dL — ABNORMAL LOW (ref 8.9–10.3)
GFR calc non Af Amer: >60 mL/min (ref >60)
Glucose, Bld: 232 mg/dL — ABNORMAL HIGH (ref 65–99)
POTASSIUM: 3.2 mmol/L — AB (ref 3.5–5.1)
SODIUM: 138 mmol/L (ref 135–145)

## 2016-10-12 MED ORDER — SODIUM CHLORIDE 0.9 % IV SOLN
20.0000 mg | Freq: Once | INTRAVENOUS | Status: AC
  Administered 2016-10-12: 20 mg via INTRAVENOUS
  Filled 2016-10-12: qty 2

## 2016-10-12 MED ORDER — SODIUM CHLORIDE 0.9 % IV SOLN
Freq: Once | INTRAVENOUS | Status: AC
Start: 2016-10-12 — End: 2016-10-12
  Administered 2016-10-12: 10:00:00 via INTRAVENOUS
  Filled 2016-10-12: qty 1000

## 2016-10-12 MED ORDER — HEPARIN SOD (PORK) LOCK FLUSH 100 UNIT/ML IV SOLN
500.0000 [IU] | Freq: Once | INTRAVENOUS | Status: AC | PRN
  Administered 2016-10-12: 500 [IU]
  Filled 2016-10-12: qty 5

## 2016-10-12 MED ORDER — SODIUM CHLORIDE 0.9 % IV SOLN
2.0000 mg/kg | Freq: Once | INTRAVENOUS | Status: AC
  Administered 2016-10-12: 126 mg via INTRAVENOUS
  Filled 2016-10-12: qty 6

## 2016-10-12 MED ORDER — SODIUM CHLORIDE 0.9% FLUSH
10.0000 mL | INTRAVENOUS | Status: DC | PRN
  Filled 2016-10-12: qty 10

## 2016-10-12 MED ORDER — PACLITAXEL CHEMO INJECTION 300 MG/50ML
80.0000 mg/m2 | Freq: Once | INTRAVENOUS | Status: AC
  Administered 2016-10-12: 138 mg via INTRAVENOUS
  Filled 2016-10-12: qty 23

## 2016-10-12 MED ORDER — ACETAMINOPHEN 325 MG PO TABS
650.0000 mg | ORAL_TABLET | Freq: Once | ORAL | Status: AC
  Administered 2016-10-12: 650 mg via ORAL
  Filled 2016-10-12: qty 2

## 2016-10-12 MED ORDER — FAMOTIDINE IN NACL 20-0.9 MG/50ML-% IV SOLN
20.0000 mg | Freq: Once | INTRAVENOUS | Status: AC
  Administered 2016-10-12: 20 mg via INTRAVENOUS
  Filled 2016-10-12: qty 50

## 2016-10-12 MED ORDER — DIPHENHYDRAMINE HCL 50 MG/ML IJ SOLN
50.0000 mg | Freq: Once | INTRAMUSCULAR | Status: AC
  Administered 2016-10-12: 50 mg via INTRAVENOUS
  Filled 2016-10-12: qty 1

## 2016-10-19 ENCOUNTER — Inpatient Hospital Stay (HOSPITAL_BASED_OUTPATIENT_CLINIC_OR_DEPARTMENT_OTHER): Payer: Medicare Other | Admitting: Internal Medicine

## 2016-10-19 ENCOUNTER — Inpatient Hospital Stay: Payer: Medicare Other | Attending: Internal Medicine

## 2016-10-19 ENCOUNTER — Inpatient Hospital Stay: Payer: Medicare Other

## 2016-10-19 VITALS — BP 133/76 | HR 88 | Temp 96.8°F | Resp 18

## 2016-10-19 VITALS — BP 138/79 | HR 94 | Temp 97.2°F | Resp 18 | Wt 133.0 lb

## 2016-10-19 DIAGNOSIS — C50412 Malignant neoplasm of upper-outer quadrant of left female breast: Secondary | ICD-10-CM | POA: Insufficient documentation

## 2016-10-19 DIAGNOSIS — R21 Rash and other nonspecific skin eruption: Secondary | ICD-10-CM

## 2016-10-19 DIAGNOSIS — Z5111 Encounter for antineoplastic chemotherapy: Secondary | ICD-10-CM | POA: Diagnosis not present

## 2016-10-19 DIAGNOSIS — Z79810 Long term (current) use of selective estrogen receptor modulators (SERMs): Secondary | ICD-10-CM | POA: Insufficient documentation

## 2016-10-19 DIAGNOSIS — K7460 Unspecified cirrhosis of liver: Secondary | ICD-10-CM

## 2016-10-19 DIAGNOSIS — C7951 Secondary malignant neoplasm of bone: Secondary | ICD-10-CM | POA: Insufficient documentation

## 2016-10-19 DIAGNOSIS — E785 Hyperlipidemia, unspecified: Secondary | ICD-10-CM | POA: Insufficient documentation

## 2016-10-19 DIAGNOSIS — J90 Pleural effusion, not elsewhere classified: Secondary | ICD-10-CM

## 2016-10-19 DIAGNOSIS — Z923 Personal history of irradiation: Secondary | ICD-10-CM | POA: Insufficient documentation

## 2016-10-19 DIAGNOSIS — Z79899 Other long term (current) drug therapy: Secondary | ICD-10-CM | POA: Diagnosis not present

## 2016-10-19 DIAGNOSIS — Z7982 Long term (current) use of aspirin: Secondary | ICD-10-CM | POA: Insufficient documentation

## 2016-10-19 DIAGNOSIS — Z170 Estrogen receptor positive status [ER+]: Secondary | ICD-10-CM

## 2016-10-19 DIAGNOSIS — C773 Secondary and unspecified malignant neoplasm of axilla and upper limb lymph nodes: Secondary | ICD-10-CM

## 2016-10-19 DIAGNOSIS — Z5112 Encounter for antineoplastic immunotherapy: Secondary | ICD-10-CM | POA: Diagnosis not present

## 2016-10-19 DIAGNOSIS — C50919 Malignant neoplasm of unspecified site of unspecified female breast: Secondary | ICD-10-CM

## 2016-10-19 DIAGNOSIS — Z7984 Long term (current) use of oral hypoglycemic drugs: Secondary | ICD-10-CM | POA: Insufficient documentation

## 2016-10-19 DIAGNOSIS — Z809 Family history of malignant neoplasm, unspecified: Secondary | ICD-10-CM | POA: Diagnosis not present

## 2016-10-19 DIAGNOSIS — Z86000 Personal history of in-situ neoplasm of breast: Secondary | ICD-10-CM | POA: Insufficient documentation

## 2016-10-19 DIAGNOSIS — I517 Cardiomegaly: Secondary | ICD-10-CM | POA: Insufficient documentation

## 2016-10-19 LAB — CBC WITH DIFFERENTIAL/PLATELET
Basophils Absolute: 0 K/uL (ref 0–0.1)
Basophils Relative: 1 %
Eosinophils Absolute: 0.1 K/uL (ref 0–0.7)
Eosinophils Relative: 2 %
HCT: 33.1 % — ABNORMAL LOW (ref 35.0–47.0)
Hemoglobin: 11 g/dL — ABNORMAL LOW (ref 12.0–16.0)
Lymphocytes Relative: 37 %
Lymphs Abs: 1.4 K/uL (ref 1.0–3.6)
MCH: 27.7 pg (ref 26.0–34.0)
MCHC: 33.3 g/dL (ref 32.0–36.0)
MCV: 83.2 fL (ref 80.0–100.0)
Monocytes Absolute: 0.4 K/uL (ref 0.2–0.9)
Monocytes Relative: 11 %
Neutro Abs: 1.9 K/uL (ref 1.4–6.5)
Neutrophils Relative %: 49 %
Platelets: 200 K/uL (ref 150–440)
RBC: 3.97 MIL/uL (ref 3.80–5.20)
RDW: 19.4 % — ABNORMAL HIGH (ref 11.5–14.5)
WBC: 3.9 K/uL (ref 3.6–11.0)

## 2016-10-19 LAB — BASIC METABOLIC PANEL
ANION GAP: 9 (ref 5–15)
BUN: 8 mg/dL (ref 6–20)
CHLORIDE: 104 mmol/L (ref 101–111)
CO2: 28 mmol/L (ref 22–32)
Calcium: 9.2 mg/dL (ref 8.9–10.3)
Creatinine, Ser: 0.55 mg/dL (ref 0.44–1.00)
GFR calc non Af Amer: >60 mL/min (ref >60)
Glucose, Bld: 162 mg/dL — ABNORMAL HIGH (ref 65–99)
POTASSIUM: 3.2 mmol/L — AB (ref 3.5–5.1)
Sodium: 141 mmol/L (ref 135–145)

## 2016-10-19 MED ORDER — DIPHENHYDRAMINE HCL 50 MG/ML IJ SOLN
50.0000 mg | Freq: Once | INTRAMUSCULAR | Status: AC
  Administered 2016-10-19: 50 mg via INTRAVENOUS
  Filled 2016-10-19: qty 1

## 2016-10-19 MED ORDER — HEPARIN SOD (PORK) LOCK FLUSH 100 UNIT/ML IV SOLN
500.0000 [IU] | Freq: Once | INTRAVENOUS | Status: AC | PRN
  Administered 2016-10-19: 500 [IU]
  Filled 2016-10-19: qty 5

## 2016-10-19 MED ORDER — SODIUM CHLORIDE 0.9% FLUSH
10.0000 mL | INTRAVENOUS | Status: DC | PRN
  Administered 2016-10-19: 10 mL
  Filled 2016-10-19: qty 10

## 2016-10-19 MED ORDER — FAMOTIDINE IN NACL 20-0.9 MG/50ML-% IV SOLN
20.0000 mg | Freq: Once | INTRAVENOUS | Status: AC
  Administered 2016-10-19: 20 mg via INTRAVENOUS
  Filled 2016-10-19: qty 50

## 2016-10-19 MED ORDER — SODIUM CHLORIDE 0.9 % IV SOLN
20.0000 mg | Freq: Once | INTRAVENOUS | Status: AC
  Administered 2016-10-19: 20 mg via INTRAVENOUS
  Filled 2016-10-19: qty 2

## 2016-10-19 MED ORDER — TRASTUZUMAB CHEMO 150 MG IV SOLR
2.0000 mg/kg | Freq: Once | INTRAVENOUS | Status: AC
  Administered 2016-10-19: 126 mg via INTRAVENOUS
  Filled 2016-10-19: qty 6

## 2016-10-19 MED ORDER — PACLITAXEL CHEMO INJECTION 300 MG/50ML
80.0000 mg/m2 | Freq: Once | INTRAVENOUS | Status: AC
  Administered 2016-10-19: 138 mg via INTRAVENOUS
  Filled 2016-10-19: qty 23

## 2016-10-19 MED ORDER — ACETAMINOPHEN 325 MG PO TABS
650.0000 mg | ORAL_TABLET | Freq: Once | ORAL | Status: AC
  Administered 2016-10-19: 650 mg via ORAL
  Filled 2016-10-19: qty 2

## 2016-10-19 MED ORDER — SODIUM CHLORIDE 0.9 % IV SOLN
Freq: Once | INTRAVENOUS | Status: AC
  Administered 2016-10-19: 11:00:00 via INTRAVENOUS
  Filled 2016-10-19: qty 1000

## 2016-10-19 NOTE — Progress Notes (Signed)
Patient is here for follow up, she has a rash on her arm that does not itch or hurt.

## 2016-10-19 NOTE — Progress Notes (Signed)
[place] OFFICE PROGRESS NOTE  Patient Care Team: Kathrine Haddock, NP as PCP - General (Nurse Practitioner) Robert Bellow, MD (General Surgery) Forest Gleason, MD (Oncology)  Breast cancer [facility])   Staging form: Breast, AJCC 7th Edition     Clinical stage from 04/06/2015: Stage IV (T2, N2, M1) - Signed by Evlyn Kanner, NP on 04/06/2015    Oncology History   1.history of carcinoma of right breast status post lumpectomy and radiation therapy (upper and outer quadrant) February 21, 2009.  Estrogen receptor 90%.  Progesterone receptor 60%.  Patient had lumpectomy and MammoSite partial breast radiation therapy followed by tamoxifen for 5 years.  Patient is still taking tamoxifen.. 2 recent mammogram in December of 2015 negative.  Further evaluation by surgeon revealed a palpable mass in the left upper outer quadrant near axilla.  Ultrasound revealed one small normal-appearing lymph node 0.6 cm in lower limit of axilla.  Adjacent to this there was ill-defined multilobulated hypoechoic mass 1.8 x 2.6 cm.  Biopsy of the left axillary mass was done which was positive for high-grade carcinoma.   2,estrogen receptor positive.  Progesterone receptor positive.  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 3.  MRI scan of breast (January, 2015) reveals multiple abnormal enlarged left axillary lymph node There are 2 oval some circumscribed mass at 2:30 and 3:00 position of the left breast.  0.6 cm x 1.3 cm and 4 x 7 mm Non-mass enhancement extending 2.4 cm these masses is 3.6 cm mass.  Findings suggestive of several pulmonary nodules.  PET  scan shows extensive disease, 4.started on Taxotere,perjeta, Herceptin(January 03, 2015- June 2016.   # June 2016- Herceptin [Letrozole + Ibrance] until May 2017.   # MAY 2017- PET PROGRESSION; # July 2017- START KADCYLA  x4 cycles- SEP 15th 2017CT- PROGRESSION  # SEP 2017- START Taxol + Herceptin.   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  Denies any worsening shortness of breath or cough. Appetite is good. No bone pain. No swelling of the legs. No fevers or chills.  Complains of a rash on her   REVIEW OF SYSTEMS:  A complete 10 point review of system is done which is negative except mentioned above/history of present illness.   PAST MEDICAL HISTORY :  Past Medical History:  Diagnosis Date  . Axillary mass 12/15/2014  . Breast cancer ([place]) 02/21/2009  . Cancer [facility]) 2010   right breast ductal carcinoma in situ  . Floaters 2014  .  Hyperlipidemia 1990  . Hypertension 2009  . Malignant neoplasm of axillary tail of female breast ([place]) 12/16/2014  . Malignant neoplasm of upper-outer quadrant of female breast [facility]) February 21, 2009   DCIS of the right breast, intermediate grade, resected the negative margins. ER 90%, PR 50%, MammoSite partial breast radiation  . 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 Special screening for malignant neoplasms, colon     PAST SURGICAL HISTORY :   Past Surgical History:  Procedure  Laterality Date  . APPENDECTOMY  1956  . BREAST BIOPSY Right 2010   stereo  . BREAST SURGERY Right 2010   wide excision  . COLONOSCOPY  2008   ??? Md  . left axilla biopsy Left 12-14-14  . PARTIAL HYSTERECTOMY  1977    FAMILY HISTORY :   Family History  Problem Relation Age of Onset  . Cancer Other     unknown family members with ovarian,colon,breast cancers  . Hypertension Mother   . Heart disease Daughter     MI    SOCIAL HISTORY:   Social History  Substance Use Topics  . Smoking status: Never Smoker  . Smokeless tobacco: Never Used  . Alcohol use No    ALLERGIES:  is allergic to penicillin g and sulfa antibiotics.  MEDICATIONS:  Current Outpatient Prescriptions  Medication Sig Dispense Refill  . ALPRAZolam (XANAX) 0.5 MG tablet TAKE 1 TABLET BY MOUTH 3 TIMES A DAY AS NEEDED 90 tablet 1  . amLODipine (NORVASC) 5 MG tablet TAKE 1 TABLET (5 MG TOTAL) BY MOUTH DAILY. 30 tablet 3  . aspirin 81 MG tablet Take 81 mg by mouth daily.    . Calcium 600-200 MG-UNIT tablet Take 1 tablet by mouth 2 (two) times daily. 120 tablet 3  . metFORMIN (GLUCOPHAGE) 500 MG tablet Take 2 tablets (1,000 mg total) by mouth 2 (two) times daily with a meal. 360 tablet 5  . Multiple Vitamins-Minerals (MULTIVITAMIN ADULT PO) Take by mouth.    . ONE TOUCH ULTRA TEST test strip     . potassium chloride SA (KLOR-CON M20) 20 MEQ tablet Take 2 tablets (40 mEq total) by mouth daily. 60 tablet 3  . rosuvastatin (CRESTOR) 5 MG tablet TAKE 1 TABLET (5 MG TOTAL) BY MOUTH DAILY. 90 tablet 1  . valsartan-hydrochlorothiazide (DIOVAN-HCT) 320-25 MG tablet TAKE 1 TABLET BY MOUTH DAILY 30 tablet 6   No current facility-administered medications for this visit.    Facility-Administered Medications Ordered in Other Visits  Medication Dose Route Frequency Provider Last Rate Last Dose  . heparin lock flush 100 unit/mL  250 Units Intracatheter PRN Forest Gleason, MD      . sodium chloride 0.9 % 250 mL with potassium  chloride 40 mEq infusion   Intravenous Continuous Forest Gleason, MD   Stopped at 05/03/15 1229  . sodium chloride 0.9 % injection 10 mL  10 mL Intracatheter PRN Forest Gleason, MD   10 mL at 04/18/15 1545  . sodium chloride flush (NS) 0.9 % injection 10 mL  10 mL Intracatheter PRN Lloyd Huger, MD   10 mL at 10/19/16 0855    PHYSICAL EXAMINATION: ECOG PERFORMANCE STATUS: 0 - Asymptomatic  BP 138/79 (BP Location: Left Arm, Patient Position: Sitting)   Pulse 94   Temp 97.2 F (36.2 C) (Tympanic)   Resp 18   Wt 133 lb (60.3 kg)   LMP  (LMP Unknown)   SpO2 97%   BMI 23.56 kg/m  Filed Weights   10/19/16 0925  Weight: 133 lb (60.3 kg)    GENERAL: Well-nourished well-developed; Alert, no distress and comfortable.   Accompanied by her husband. EYES: no pallor or icterus OROPHARYNX: no thrush or ulceration; poor dentition  NECK: supple, no masses felt LYMPH:  no palpable lymphadenopathy in the cervical, axillary or inguinal regions LUNGS: clear to auscultation and  No wheeze or crackles HEART/CVS: regular rate & rhythm and no murmurs; No lower extremity edema ABDOMEN:abdomen soft, non-tender and normal bowel sounds Musculoskeletal:no cyanosis of digits and no clubbing  PSYCH: alert & oriented x 3 with fluent speech NEURO: no focal motor/sensory deficits SKIN:  no rashes or significant lesions LEFT BREAST- 1-2cm mass left upper outer quadrant/axillary tail. Nontender. Skin: macular rash noted on the bilateral extremities  LABORATORY DATA:  I have reviewed the data as listed    Component Value Date/Time   NA 141 10/19/2016 0901   NA 136 09/16/2015 0843   NA 138 03/28/2015 0923   K 3.2 (L) 10/19/2016 0901   K 3.3 (L) 03/28/2015 0923   CL 104 10/19/2016 0901   CL 101 03/28/2015 0923   CO2 28 10/19/2016 0901   CO2 28 03/28/2015 0923   GLUCOSE 162 (H) 10/19/2016 0901   GLUCOSE 173 (H) 03/28/2015 0923   BUN 8 10/19/2016 0901   BUN 10 09/16/2015 0843   BUN 9 03/28/2015 0923    CREATININE 0.55 10/19/2016 0901   CREATININE 0.56 03/28/2015 0923   CALCIUM 9.2 10/19/2016 0901   CALCIUM 8.6 (L) 03/28/2015 0923   PROT 7.1 10/05/2016 0854   PROT 7.2 09/16/2015 0843   PROT 7.1 03/28/2015 0923   ALBUMIN 3.6 10/05/2016 0854   ALBUMIN 4.6 09/16/2015 0843   ALBUMIN 3.6 03/28/2015 0923   AST 43 (H) 10/05/2016 0854   AST 43 (H) 03/28/2015 0923   ALT 34 10/05/2016 0854   ALT 26 03/28/2015 0923   ALKPHOS 129 (H) 10/05/2016 0854   ALKPHOS 85 03/28/2015 0923   BILITOT 0.9 10/05/2016 0854   BILITOT 0.9 09/16/2015 0843   BILITOT 0.6 03/28/2015 0923   GFRNONAA >60 10/19/2016 0901   GFRNONAA >60 03/28/2015 0923   GFRAA >60 10/19/2016 0901   GFRAA >60 03/28/2015 0923    No results found for: SPEP, UPEP  Lab Results  Component Value Date   WBC 3.9 10/19/2016   NEUTROABS 1.9 10/19/2016   HGB 11.0 (L) 10/19/2016   HCT 33.1 (L) 10/19/2016   MCV 83.2 10/19/2016   PLT 200 10/19/2016      Chemistry      Component Value Date/Time   NA 141 10/19/2016 0901   NA 136 09/16/2015 0843   NA 138 03/28/2015 0923   K 3.2 (L) 10/19/2016 0901   K 3.3 (L) 03/28/2015 0923   CL 104 10/19/2016 0901   CL 101 03/28/2015 0923   CO2 28 10/19/2016 0901   CO2 28 03/28/2015 0923   BUN 8 10/19/2016 0901   BUN 10 09/16/2015 0843   BUN 9 03/28/2015 0923   CREATININE 0.55 10/19/2016 0901   CREATININE 0.56 03/28/2015 0923      Component Value Date/Time   CALCIUM 9.2 10/19/2016 0901   CALCIUM 8.6 (L) 03/28/2015 0923   ALKPHOS 129 (H) 10/05/2016 0854   ALKPHOS 85 03/28/2015 0923   AST 43 (H) 10/05/2016 0854   AST 43 (H) 03/28/2015 0923   ALT 34 10/05/2016 0854   ALT 26 03/28/2015 0923   BILITOT 0.9 10/05/2016 0854   BILITOT  0.9 09/16/2015 0843   BILITOT 0.6 03/28/2015 0923     IMPRESSION: 1. Progressive metastatic breast cancer, now with extensive involvement of the right pleural space and associated with a moderate to large right pleural effusion. 2. The left subpectoral  and axillary lymph nodes have enlarged and are irregular. 3. There is evidence of diffuse osseous metastatic disease, similar to prior PET-CT, but the only active osseous lesion on prior PET-CT was the S1 metastatic lesion. 4. Hepatic cirrhosis, with bandlike infiltrative hypodensity in the right hepatic lobe and medial segment left hepatic lobe anteriorly probably from fatty infiltration. 5. Other imaging findings of potential clinical significance: Cardiomegaly. Coronary, aortic arch, and branch vessel atherosclerotic vascular disease. Aortoiliac atherosclerotic vascular disease. Peripheral fibrosis in the lungs.   Electronically Signed   By: Van Clines M.D.   On: 08/31/2016 13:24  RADIOGRAPHIC STUDIES: I have personally reviewed the radiological images as listed and agreed with the findings in the report. No results found.   ASSESSMENT & PLAN:  Carcinoma of upper-outer quadrant of left breast in female, estrogen receptor positive ([place]) Metastatic breast cancer to the bone/lung. Sep 2017- CT scan shows large right-sided pleural effusion progressive disease.On taxol- Herceptin cycle #2 day 1 appx 1 week ago. No evidence of progression.   # proceed with cycle #2 day 8 today. MUGA scan- 69% [sep 27th]  # Right-sided pleural effusion status post thoracentesis. Likely malignant; although cytology negative. Clinically not worse at this time. Clinically seems to be improving.   # Bil arm rash- recommend OTC Triple antibiotic cream.   # Bone metastases on Xgeva; start next week.   # follow up in 3  weeks/ labs/MD; weekly labs/ treatment.   Orders Placed This Encounter  Procedures  . CBC with Differential    Standing Status:   Future    Standing Expiration Date:   10/19/2017  . Basic metabolic panel    Standing Status:   Future    Standing Expiration Date:   10/19/2017  . CBC with Differential    Standing Status:   Future    Standing Expiration Date:   10/19/2017  .  Comprehensive metabolic panel    Standing Status:   Future    Standing Expiration Date:   10/19/2017       Cammie Sickle, MD 10/19/2016 5:50 PM

## 2016-10-19 NOTE — Assessment & Plan Note (Addendum)
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 Sep 2017- CT scan shows large right-sided pleural effusion progressive disease.On taxol- Herceptin cycle #2 day 1 appx 1 week ago. No evidence of progression.   # proceed with cycle #2 day 8 today. MUGA scan- 69% [sep 27th]  # Right-sided pleural effusion status post thoracentesis. Likely malignant; although cytology negative. Clinically not worse at this time. Clinically seems to be improving.   # Bil arm rash- recommend OTC Triple antibiotic cream.   # Bone metastases on Xgeva; start next week.   # follow up in 3  weeks/ labs/MD; weekly labs/ treatment.

## 2016-10-26 ENCOUNTER — Inpatient Hospital Stay: Payer: Medicare Other

## 2016-10-26 ENCOUNTER — Ambulatory Visit: Payer: Medicare Other | Admitting: Internal Medicine

## 2016-10-26 VITALS — BP 135/72 | HR 73 | Temp 97.0°F | Resp 18

## 2016-10-26 DIAGNOSIS — C50611 Malignant neoplasm of axillary tail of right female breast: Secondary | ICD-10-CM

## 2016-10-26 DIAGNOSIS — C7951 Secondary malignant neoplasm of bone: Secondary | ICD-10-CM | POA: Diagnosis not present

## 2016-10-26 DIAGNOSIS — Z170 Estrogen receptor positive status [ER+]: Secondary | ICD-10-CM | POA: Diagnosis not present

## 2016-10-26 DIAGNOSIS — C50412 Malignant neoplasm of upper-outer quadrant of left female breast: Secondary | ICD-10-CM | POA: Diagnosis not present

## 2016-10-26 DIAGNOSIS — Z5111 Encounter for antineoplastic chemotherapy: Secondary | ICD-10-CM | POA: Diagnosis not present

## 2016-10-26 DIAGNOSIS — C50919 Malignant neoplasm of unspecified site of unspecified female breast: Secondary | ICD-10-CM

## 2016-10-26 DIAGNOSIS — C773 Secondary and unspecified malignant neoplasm of axilla and upper limb lymph nodes: Secondary | ICD-10-CM | POA: Diagnosis not present

## 2016-10-26 DIAGNOSIS — C50612 Malignant neoplasm of axillary tail of left female breast: Secondary | ICD-10-CM

## 2016-10-26 DIAGNOSIS — Z5112 Encounter for antineoplastic immunotherapy: Secondary | ICD-10-CM | POA: Diagnosis not present

## 2016-10-26 LAB — CBC WITH DIFFERENTIAL/PLATELET
BASOS ABS: 0.1 10*3/uL (ref 0–0.1)
BASOS PCT: 1 %
EOS ABS: 0.1 10*3/uL (ref 0–0.7)
EOS PCT: 2 %
HCT: 31 % — ABNORMAL LOW (ref 35.0–47.0)
Hemoglobin: 10.5 g/dL — ABNORMAL LOW (ref 12.0–16.0)
LYMPHS ABS: 1.2 10*3/uL (ref 1.0–3.6)
Lymphocytes Relative: 31 %
MCH: 28.4 pg (ref 26.0–34.0)
MCHC: 34 g/dL (ref 32.0–36.0)
MCV: 83.4 fL (ref 80.0–100.0)
Monocytes Absolute: 0.3 10*3/uL (ref 0.2–0.9)
Monocytes Relative: 8 %
Neutro Abs: 2.3 10*3/uL (ref 1.4–6.5)
Neutrophils Relative %: 58 %
PLATELETS: 186 10*3/uL (ref 150–440)
RBC: 3.72 MIL/uL — AB (ref 3.80–5.20)
RDW: 19.2 % — ABNORMAL HIGH (ref 11.5–14.5)
WBC: 3.9 10*3/uL (ref 3.6–11.0)

## 2016-10-26 LAB — BASIC METABOLIC PANEL
Anion gap: 9 (ref 5–15)
BUN: 8 mg/dL (ref 6–20)
CO2: 25 mmol/L (ref 22–32)
CREATININE: 0.66 mg/dL (ref 0.44–1.00)
Calcium: 9.3 mg/dL (ref 8.9–10.3)
Chloride: 104 mmol/L (ref 101–111)
Glucose, Bld: 251 mg/dL — ABNORMAL HIGH (ref 65–99)
POTASSIUM: 3.5 mmol/L (ref 3.5–5.1)
SODIUM: 138 mmol/L (ref 135–145)

## 2016-10-26 MED ORDER — ACETAMINOPHEN 325 MG PO TABS
650.0000 mg | ORAL_TABLET | Freq: Once | ORAL | Status: AC
  Administered 2016-10-26: 650 mg via ORAL
  Filled 2016-10-26: qty 2

## 2016-10-26 MED ORDER — SODIUM CHLORIDE 0.9 % IV SOLN
80.0000 mg/m2 | Freq: Once | INTRAVENOUS | Status: AC
  Administered 2016-10-26: 138 mg via INTRAVENOUS
  Filled 2016-10-26: qty 23

## 2016-10-26 MED ORDER — SODIUM CHLORIDE 0.9% FLUSH
10.0000 mL | INTRAVENOUS | Status: DC | PRN
  Administered 2016-10-26: 10 mL
  Filled 2016-10-26: qty 10

## 2016-10-26 MED ORDER — SODIUM CHLORIDE 0.9 % IV SOLN
20.0000 mg | Freq: Once | INTRAVENOUS | Status: AC
  Administered 2016-10-26: 20 mg via INTRAVENOUS
  Filled 2016-10-26: qty 2

## 2016-10-26 MED ORDER — DIPHENHYDRAMINE HCL 50 MG/ML IJ SOLN
50.0000 mg | Freq: Once | INTRAMUSCULAR | Status: AC
  Administered 2016-10-26: 50 mg via INTRAVENOUS
  Filled 2016-10-26: qty 1

## 2016-10-26 MED ORDER — HEPARIN SOD (PORK) LOCK FLUSH 100 UNIT/ML IV SOLN
500.0000 [IU] | Freq: Once | INTRAVENOUS | Status: AC | PRN
  Administered 2016-10-26: 500 [IU]
  Filled 2016-10-26: qty 5

## 2016-10-26 MED ORDER — FAMOTIDINE IN NACL 20-0.9 MG/50ML-% IV SOLN
20.0000 mg | Freq: Once | INTRAVENOUS | Status: AC
  Administered 2016-10-26: 20 mg via INTRAVENOUS
  Filled 2016-10-26: qty 50

## 2016-10-26 MED ORDER — SODIUM CHLORIDE 0.9 % IV SOLN
Freq: Once | INTRAVENOUS | Status: AC
Start: 2016-10-26 — End: 2016-10-26
  Administered 2016-10-26: 09:00:00 via INTRAVENOUS
  Filled 2016-10-26: qty 1000

## 2016-10-26 MED ORDER — SODIUM CHLORIDE 0.9 % IV SOLN
2.0000 mg/kg | Freq: Once | INTRAVENOUS | Status: AC
  Administered 2016-10-26: 126 mg via INTRAVENOUS
  Filled 2016-10-26: qty 6

## 2016-10-26 MED ORDER — DENOSUMAB 120 MG/1.7ML ~~LOC~~ SOLN
120.0000 mg | Freq: Once | SUBCUTANEOUS | Status: AC
  Administered 2016-10-26: 120 mg via SUBCUTANEOUS
  Filled 2016-10-26: qty 1.7

## 2016-10-29 ENCOUNTER — Encounter: Payer: Self-pay | Admitting: Unknown Physician Specialty

## 2016-11-01 ENCOUNTER — Other Ambulatory Visit: Payer: Self-pay | Admitting: *Deleted

## 2016-11-01 DIAGNOSIS — F4323 Adjustment disorder with mixed anxiety and depressed mood: Secondary | ICD-10-CM

## 2016-11-01 MED ORDER — ALPRAZOLAM 0.5 MG PO TABS: 0.5000 mg | ORAL_TABLET | Freq: Three times a day (TID) | ORAL | 2 refills | Status: DC | PRN

## 2016-11-01 NOTE — Progress Notes (Signed)
[facility] request for alprazolam 0.5 mg tablet.  rx renewal- [facility] - [address].

## 2016-11-02 ENCOUNTER — Inpatient Hospital Stay: Payer: Medicare Other

## 2016-11-02 VITALS — BP 129/69 | HR 80 | Temp 97.4°F | Resp 18

## 2016-11-02 DIAGNOSIS — Z170 Estrogen receptor positive status [ER+]: Principal | ICD-10-CM

## 2016-11-02 DIAGNOSIS — C50412 Malignant neoplasm of upper-outer quadrant of left female breast: Secondary | ICD-10-CM | POA: Diagnosis not present

## 2016-11-02 DIAGNOSIS — C773 Secondary and unspecified malignant neoplasm of axilla and upper limb lymph nodes: Secondary | ICD-10-CM | POA: Diagnosis not present

## 2016-11-02 DIAGNOSIS — Z5111 Encounter for antineoplastic chemotherapy: Secondary | ICD-10-CM | POA: Diagnosis not present

## 2016-11-02 DIAGNOSIS — C50919 Malignant neoplasm of unspecified site of unspecified female breast: Secondary | ICD-10-CM

## 2016-11-02 DIAGNOSIS — C7951 Secondary malignant neoplasm of bone: Secondary | ICD-10-CM | POA: Diagnosis not present

## 2016-11-02 DIAGNOSIS — Z5112 Encounter for antineoplastic immunotherapy: Secondary | ICD-10-CM | POA: Diagnosis not present

## 2016-11-02 LAB — CBC WITH DIFFERENTIAL/PLATELET
BASOS ABS: 0 10*3/uL (ref 0–0.1)
BASOS PCT: 1 %
EOS ABS: 0.1 10*3/uL (ref 0–0.7)
Eosinophils Relative: 3 %
HCT: 29.4 % — ABNORMAL LOW (ref 35.0–47.0)
HEMOGLOBIN: 9.8 g/dL — AB (ref 12.0–16.0)
Lymphocytes Relative: 40 %
Lymphs Abs: 1.4 10*3/uL (ref 1.0–3.6)
MCH: 28.1 pg (ref 26.0–34.0)
MCHC: 33.2 g/dL (ref 32.0–36.0)
MCV: 84.5 fL (ref 80.0–100.0)
Monocytes Absolute: 0.3 10*3/uL (ref 0.2–0.9)
Monocytes Relative: 9 %
NEUTROS PCT: 47 %
Neutro Abs: 1.7 10*3/uL (ref 1.4–6.5)
PLATELETS: 174 10*3/uL (ref 150–440)
RBC: 3.48 MIL/uL — AB (ref 3.80–5.20)
RDW: 20.3 % — ABNORMAL HIGH (ref 11.5–14.5)
WBC: 3.6 10*3/uL (ref 3.6–11.0)

## 2016-11-02 LAB — BASIC METABOLIC PANEL
Anion gap: 7 (ref 5–15)
BUN: 11 mg/dL (ref 6–20)
CHLORIDE: 107 mmol/L (ref 101–111)
CO2: 25 mmol/L (ref 22–32)
Calcium: 9.2 mg/dL (ref 8.9–10.3)
Creatinine, Ser: 0.56 mg/dL (ref 0.44–1.00)
Glucose, Bld: 265 mg/dL — ABNORMAL HIGH (ref 65–99)
POTASSIUM: 3.5 mmol/L (ref 3.5–5.1)
SODIUM: 139 mmol/L (ref 135–145)

## 2016-11-02 MED ORDER — SODIUM CHLORIDE 0.9 % IV SOLN
Freq: Once | INTRAVENOUS | Status: AC
  Administered 2016-11-02: 10:00:00 via INTRAVENOUS
  Filled 2016-11-02: qty 1000

## 2016-11-02 MED ORDER — SODIUM CHLORIDE 0.9% FLUSH
10.0000 mL | INTRAVENOUS | Status: DC | PRN
  Administered 2016-11-02: 10 mL
  Filled 2016-11-02: qty 10

## 2016-11-02 MED ORDER — HEPARIN SOD (PORK) LOCK FLUSH 100 UNIT/ML IV SOLN
500.0000 [IU] | Freq: Once | INTRAVENOUS | Status: AC | PRN
  Administered 2016-11-02: 500 [IU]
  Filled 2016-11-02: qty 5

## 2016-11-02 MED ORDER — TRASTUZUMAB CHEMO 150 MG IV SOLR
2.0000 mg/kg | Freq: Once | INTRAVENOUS | Status: AC
  Administered 2016-11-02: 126 mg via INTRAVENOUS
  Filled 2016-11-02: qty 6

## 2016-11-02 MED ORDER — PACLITAXEL CHEMO INJECTION 300 MG/50ML
80.0000 mg/m2 | Freq: Once | INTRAVENOUS | Status: AC
  Administered 2016-11-02: 138 mg via INTRAVENOUS
  Filled 2016-11-02: qty 23

## 2016-11-02 MED ORDER — SODIUM CHLORIDE 0.9 % IV SOLN
20.0000 mg | Freq: Once | INTRAVENOUS | Status: AC
  Administered 2016-11-02: 20 mg via INTRAVENOUS
  Filled 2016-11-02: qty 2

## 2016-11-02 MED ORDER — DIPHENHYDRAMINE HCL 50 MG/ML IJ SOLN
50.0000 mg | Freq: Once | INTRAMUSCULAR | Status: AC
  Administered 2016-11-02: 50 mg via INTRAVENOUS
  Filled 2016-11-02: qty 1

## 2016-11-02 MED ORDER — FAMOTIDINE IN NACL 20-0.9 MG/50ML-% IV SOLN
20.0000 mg | Freq: Once | INTRAVENOUS | Status: AC
  Administered 2016-11-02: 20 mg via INTRAVENOUS
  Filled 2016-11-02: qty 50

## 2016-11-02 MED ORDER — ACETAMINOPHEN 325 MG PO TABS
650.0000 mg | ORAL_TABLET | Freq: Once | ORAL | Status: AC
  Administered 2016-11-02: 650 mg via ORAL
  Filled 2016-11-02: qty 2

## 2016-11-03 LAB — CANCER ANTIGEN 27.29: CA 27.29: 65.6 U/mL — ABNORMAL HIGH (ref 0.0–38.6)

## 2016-11-14 ENCOUNTER — Encounter: Payer: Self-pay | Admitting: Internal Medicine

## 2016-11-14 ENCOUNTER — Inpatient Hospital Stay: Payer: Medicare Other

## 2016-11-14 ENCOUNTER — Inpatient Hospital Stay (HOSPITAL_BASED_OUTPATIENT_CLINIC_OR_DEPARTMENT_OTHER): Payer: Medicare Other | Admitting: Internal Medicine

## 2016-11-14 VITALS — BP 160/85 | HR 101 | Temp 96.3°F | Wt 136.0 lb

## 2016-11-14 DIAGNOSIS — Z809 Family history of malignant neoplasm, unspecified: Secondary | ICD-10-CM

## 2016-11-14 DIAGNOSIS — Z170 Estrogen receptor positive status [ER+]: Principal | ICD-10-CM

## 2016-11-14 DIAGNOSIS — E785 Hyperlipidemia, unspecified: Secondary | ICD-10-CM

## 2016-11-14 DIAGNOSIS — Z79899 Other long term (current) drug therapy: Secondary | ICD-10-CM

## 2016-11-14 DIAGNOSIS — Z86000 Personal history of in-situ neoplasm of breast: Secondary | ICD-10-CM

## 2016-11-14 DIAGNOSIS — C50412 Malignant neoplasm of upper-outer quadrant of left female breast: Secondary | ICD-10-CM

## 2016-11-14 DIAGNOSIS — J90 Pleural effusion, not elsewhere classified: Secondary | ICD-10-CM

## 2016-11-14 DIAGNOSIS — Z5112 Encounter for antineoplastic immunotherapy: Secondary | ICD-10-CM | POA: Diagnosis not present

## 2016-11-14 DIAGNOSIS — Z5111 Encounter for antineoplastic chemotherapy: Secondary | ICD-10-CM | POA: Diagnosis not present

## 2016-11-14 DIAGNOSIS — C7951 Secondary malignant neoplasm of bone: Secondary | ICD-10-CM

## 2016-11-14 DIAGNOSIS — K7460 Unspecified cirrhosis of liver: Secondary | ICD-10-CM

## 2016-11-14 DIAGNOSIS — C773 Secondary and unspecified malignant neoplasm of axilla and upper limb lymph nodes: Secondary | ICD-10-CM

## 2016-11-14 DIAGNOSIS — Z79810 Long term (current) use of selective estrogen receptor modulators (SERMs): Secondary | ICD-10-CM

## 2016-11-14 DIAGNOSIS — Z7984 Long term (current) use of oral hypoglycemic drugs: Secondary | ICD-10-CM

## 2016-11-14 DIAGNOSIS — I517 Cardiomegaly: Secondary | ICD-10-CM

## 2016-11-14 DIAGNOSIS — Z7982 Long term (current) use of aspirin: Secondary | ICD-10-CM

## 2016-11-14 DIAGNOSIS — Z923 Personal history of irradiation: Secondary | ICD-10-CM

## 2016-11-14 DIAGNOSIS — C50919 Malignant neoplasm of unspecified site of unspecified female breast: Secondary | ICD-10-CM

## 2016-11-14 DIAGNOSIS — R21 Rash and other nonspecific skin eruption: Secondary | ICD-10-CM

## 2016-11-14 LAB — CBC WITH DIFFERENTIAL/PLATELET
Basophils Absolute: 0 10*3/uL (ref 0–0.1)
Basophils Relative: 1 %
EOS ABS: 0.1 10*3/uL (ref 0–0.7)
EOS PCT: 3 %
HCT: 31 % — ABNORMAL LOW (ref 35.0–47.0)
Hemoglobin: 10.3 g/dL — ABNORMAL LOW (ref 12.0–16.0)
LYMPHS ABS: 1.2 10*3/uL (ref 1.0–3.6)
Lymphocytes Relative: 26 %
MCH: 28.3 pg (ref 26.0–34.0)
MCHC: 33.1 g/dL (ref 32.0–36.0)
MCV: 85.6 fL (ref 80.0–100.0)
MONOS PCT: 13 %
Monocytes Absolute: 0.6 10*3/uL (ref 0.2–0.9)
Neutro Abs: 2.7 10*3/uL (ref 1.4–6.5)
Neutrophils Relative %: 57 %
PLATELETS: 186 10*3/uL (ref 150–440)
RBC: 3.63 MIL/uL — ABNORMAL LOW (ref 3.80–5.20)
RDW: 22.1 % — AB (ref 11.5–14.5)
WBC: 4.7 10*3/uL (ref 3.6–11.0)

## 2016-11-14 LAB — COMPREHENSIVE METABOLIC PANEL
ALT: 29 U/L (ref 14–54)
ANION GAP: 8 (ref 5–15)
AST: 40 U/L (ref 15–41)
Albumin: 3.6 g/dL (ref 3.5–5.0)
Alkaline Phosphatase: 131 U/L — ABNORMAL HIGH (ref 38–126)
BUN: 8 mg/dL (ref 6–20)
CALCIUM: 8.8 mg/dL — AB (ref 8.9–10.3)
CHLORIDE: 105 mmol/L (ref 101–111)
CO2: 24 mmol/L (ref 22–32)
Creatinine, Ser: 0.62 mg/dL (ref 0.44–1.00)
GFR calc non Af Amer: >60 mL/min (ref >60)
Glucose, Bld: 286 mg/dL — ABNORMAL HIGH (ref 65–99)
POTASSIUM: 3.8 mmol/L (ref 3.5–5.1)
SODIUM: 137 mmol/L (ref 135–145)
Total Bilirubin: 0.5 mg/dL (ref 0.3–1.2)
Total Protein: 6.8 g/dL (ref 6.5–8.1)

## 2016-11-14 MED ORDER — ACETAMINOPHEN 325 MG PO TABS
650.0000 mg | ORAL_TABLET | Freq: Once | ORAL | Status: AC
  Administered 2016-11-14: 650 mg via ORAL
  Filled 2016-11-14: qty 2

## 2016-11-14 MED ORDER — SODIUM CHLORIDE 0.9% FLUSH
10.0000 mL | INTRAVENOUS | Status: DC | PRN
  Administered 2016-11-14: 10 mL
  Filled 2016-11-14: qty 10

## 2016-11-14 MED ORDER — HEPARIN SOD (PORK) LOCK FLUSH 100 UNIT/ML IV SOLN
500.0000 [IU] | Freq: Once | INTRAVENOUS | Status: AC | PRN
  Administered 2016-11-14: 500 [IU]
  Filled 2016-11-14: qty 5

## 2016-11-14 MED ORDER — DIPHENHYDRAMINE HCL 50 MG/ML IJ SOLN
50.0000 mg | Freq: Once | INTRAMUSCULAR | Status: AC
  Administered 2016-11-14: 50 mg via INTRAVENOUS
  Filled 2016-11-14: qty 1

## 2016-11-14 MED ORDER — TRASTUZUMAB CHEMO 150 MG IV SOLR
2.0000 mg/kg | Freq: Once | INTRAVENOUS | Status: AC
  Administered 2016-11-14: 126 mg via INTRAVENOUS
  Filled 2016-11-14: qty 6

## 2016-11-14 MED ORDER — FAMOTIDINE IN NACL 20-0.9 MG/50ML-% IV SOLN
20.0000 mg | Freq: Once | INTRAVENOUS | Status: AC
  Administered 2016-11-14: 20 mg via INTRAVENOUS
  Filled 2016-11-14: qty 50

## 2016-11-14 MED ORDER — SODIUM CHLORIDE 0.9 % IV SOLN
20.0000 mg | Freq: Once | INTRAVENOUS | Status: AC
  Administered 2016-11-14: 20 mg via INTRAVENOUS
  Filled 2016-11-14: qty 2

## 2016-11-14 MED ORDER — SODIUM CHLORIDE 0.9 % IV SOLN
80.0000 mg/m2 | Freq: Once | INTRAVENOUS | Status: AC
  Administered 2016-11-14: 138 mg via INTRAVENOUS
  Filled 2016-11-14: qty 23

## 2016-11-14 MED ORDER — SODIUM CHLORIDE 0.9 % IV SOLN
Freq: Once | INTRAVENOUS | Status: AC
  Administered 2016-11-14: 10:00:00 via INTRAVENOUS
  Filled 2016-11-14: qty 1000

## 2016-11-14 NOTE — Progress Notes (Signed)
[facility] OFFICE PROGRESS NOTE  Patient Care Team: Cheryl Wicker, NP as PCP - General (Nurse Practitioner) Jeffrey W Byrnett, MD (General Surgery) Janak Choksi, MD (Oncology)  Breast cancer (HCC)   Staging form: Breast, AJCC 7th Edition     Clinical stage from 04/06/2015: Stage IV (T2, N2, M1) - Signed by Leslie F Herring, NP on 04/06/2015    Oncology History   1.history of carcinoma of right breast status post lumpectomy and radiation therapy (upper and outer quadrant) February 21, 2009.  Estrogen receptor 90%.  Progesterone receptor 60%.  Patient had lumpectomy and MammoSite partial breast radiation therapy followed by tamoxifen for 5 years.  Patient is still taking tamoxifen.. 2 recent mammogram in December of 2015 negative.  Further evaluation by surgeon revealed a palpable mass in the left upper outer quadrant near axilla.  Ultrasound revealed one small normal-appearing lymph node 0.6 cm in lower limit of axilla.  Adjacent to this there was ill-defined multilobulated hypoechoic mass 1.8 x 2.6 cm.  Biopsy of the left axillary mass was done which was positive for high-grade carcinoma.   2,estrogen receptor positive.  Progesterone receptor positive.  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 3.  MRI scan of breast (January, 2015) reveals multiple abnormal enlarged left axillary lymph node There are 2 oval some circumscribed mass at 2:30 and 3:00 position of the left breast.  0.6 cm x 1.3 cm and 4 x 7 mm Non-mass enhancement extending 2.4 cm these masses is 3.6 cm mass.  Findings suggestive of several pulmonary nodules.  PET  scan shows extensive disease, 4.started on Taxotere,perjeta, Herceptin(January 03, 2015- June 2016.   # June 2016- Herceptin [Letrozole + Ibrance] until May 2017.   # MAY 2017- PET PROGRESSION; # July 2017- START KADCYLA  x4 cycles- SEP 15th 2017CT- PROGRESSION  # SEP 2017- START Taxol + Herceptin.   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   Her appetite is good. Denies any worsening shortness of breath. Denies any bone pain. No fevers or chills. Denies any new onset of skin rash. Denies any tingling or numbness.  REVIEW OF SYSTEMS:  A complete 10 point review of system is done which is negative except mentioned above/history of present illness.   PAST MEDICAL HISTORY :  Past Medical History:  Diagnosis Date  . Axillary mass 12/15/2014  . Breast cancer (HCC) 02/21/2009  . Cancer (HCC) 2010   right breast ductal carcinoma in situ  . Floaters 2014  .   Hyperlipidemia 1990  . Hypertension 2009  . Malignant neoplasm of axillary tail of female breast ([place]) 12/16/2014  . 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 ER 90%, PR 50%, MammoSite partial breast radiation  . 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 Special screening for malignant neoplasms, colon     PAST SURGICAL HISTORY :   Past Surgical History:   Procedure Laterality Date  . APPENDECTOMY  1956  . BREAST BIOPSY Right 2010   stereo  . BREAST SURGERY Right 2010   wide excision  . COLONOSCOPY  2008   ??? Md  . left axilla biopsy Left 12-14-14  . PARTIAL HYSTERECTOMY  1977    FAMILY HISTORY :   Family History  Problem Relation Age of Onset  . Cancer Other     unknown family members with ovarian,colon,breast cancers  . Hypertension Mother   . Heart disease Daughter     MI    SOCIAL HISTORY:   Social History  Substance Use Topics  . Smoking status: Never Smoker  . Smokeless tobacco: Never Used  . Alcohol use No    ALLERGIES:  is allergic to penicillin g and sulfa antibiotics.  MEDICATIONS:  Current Outpatient Prescriptions  Medication Sig Dispense Refill  . ALPRAZolam (XANAX) 0.5 MG tablet Take 1 tablet (0.5 mg total) by mouth 3 (three) times daily as needed. 90 tablet 2  . amLODipine (NORVASC) 5 MG tablet TAKE 1 TABLET (5 MG TOTAL) BY MOUTH DAILY. 30 tablet 3  . aspirin 81 MG tablet Take 81 mg by mouth daily.    . Calcium 600-200 MG-UNIT tablet Take 1 tablet by mouth 2 (two) times daily. 120 tablet 3  . metFORMIN (GLUCOPHAGE) 500 MG tablet Take 2 tablets (1,000 mg total) by mouth 2 (two) times daily with a meal. 360 tablet 5  . Multiple Vitamins-Minerals (MULTIVITAMIN ADULT PO) Take by mouth.    . ONE TOUCH ULTRA TEST test strip     . potassium chloride SA (KLOR-CON M20) 20 MEQ tablet Take 2 tablets (40 mEq total) by mouth daily. 60 tablet 3  . rosuvastatin (CRESTOR) 5 MG tablet TAKE 1 TABLET (5 MG TOTAL) BY MOUTH DAILY. 90 tablet 1  . valsartan-hydrochlorothiazide (DIOVAN-HCT) 320-25 MG tablet TAKE 1 TABLET BY MOUTH DAILY 30 tablet 6   No current facility-administered medications for this visit.    Facility-Administered Medications Ordered in Other Visits  Medication Dose Route Frequency Provider Last Rate Last Dose  . acetaminophen (TYLENOL) tablet 650 mg  650 mg Oral Once Cammie Sickle, MD      .  dexamethasone (DECADRON) 20 mg in sodium chloride 0.9 % 50 mL IVPB  20 mg Intravenous Once Cammie Sickle, MD      . diphenhydrAMINE (BENADRYL) injection 50 mg  50 mg Intravenous Once Cammie Sickle, MD      . famotidine (PEPCID) IVPB 20 mg premix  20 mg Intravenous Once Cammie Sickle, MD      . heparin lock flush 100 unit/mL  250 Units Intracatheter PRN Forest Gleason, MD      . heparin lock flush 100 unit/mL  500 Units Intracatheter Once PRN Cammie Sickle, MD      . PACLitaxel (TAXOL) 138 mg in sodium chloride 0.9 % 250 mL chemo infusion (</= 38m/m2)  80 mg/m2 (Treatment Plan Recorded) Intravenous Once GGabon  R Brahmanday, MD      . sodium chloride 0.9 % 250 mL with potassium chloride 40 mEq infusion   Intravenous Continuous Janak Choksi, MD   Stopped at 05/03/15 1229  . sodium chloride 0.9 % injection 10 mL  10 mL Intracatheter PRN Janak Choksi, MD   10 mL at 04/18/15 1545  . sodium chloride flush (NS) 0.9 % injection 10 mL  10 mL Intracatheter PRN Govinda R Brahmanday, MD   10 mL at 11/14/16 0842  . trastuzumab (HERCEPTIN) 126 mg in sodium chloride 0.9 % 250 mL chemo infusion  2 mg/kg (Treatment Plan Recorded) Intravenous Once Govinda R Brahmanday, MD        PHYSICAL EXAMINATION: ECOG PERFORMANCE STATUS: 0 - Asymptomatic  BP (!) 160/85 (BP Location: Left Arm, Patient Position: Sitting)   Pulse (!) 101   Temp (!) 96.3 F (35.7 C) (Tympanic)   Wt 136 lb (61.7 kg)   LMP  (LMP Unknown)   BMI 24.09 kg/m   Filed Weights   11/14/16 0904  Weight: 136 lb (61.7 kg)    GENERAL: Well-nourished well-developed; Alert, no distress and comfortable.   Accompanied by her husband. EYES: no pallor or icterus OROPHARYNX: no thrush or ulceration; poor dentition  NECK: supple, no masses felt LYMPH:  no palpable lymphadenopathy in the cervical, axillary or inguinal regions LUNGS: clear to auscultation and  No wheeze or crackles HEART/CVS: regular rate & rhythm and no murmurs;  No lower extremity edema ABDOMEN:abdomen soft, non-tender and normal bowel sounds Musculoskeletal:no cyanosis of digits and no clubbing  PSYCH: alert & oriented x 3 with fluent speech NEURO: no focal motor/sensory deficits SKIN:  no rashes or significant lesions LEFT BREAST- 1-2cm mass left upper outer quadrant/axillary tail. Nontender. Skin: macular rash noted on the bilateral extremities  LABORATORY DATA:  I have reviewed the data as listed    Component Value Date/Time   NA 137 11/14/2016 0830   NA 136 09/16/2015 0843   NA 138 03/28/2015 0923   K 3.8 11/14/2016 0830   K 3.3 (L) 03/28/2015 0923   CL 105 11/14/2016 0830   CL 101 03/28/2015 0923   CO2 24 11/14/2016 0830   CO2 28 03/28/2015 0923   GLUCOSE 286 (H) 11/14/2016 0830   GLUCOSE 173 (H) 03/28/2015 0923   BUN 8 11/14/2016 0830   BUN 10 09/16/2015 0843   BUN 9 03/28/2015 0923   CREATININE 0.62 11/14/2016 0830   CREATININE 0.56 03/28/2015 0923   CALCIUM 8.8 (L) 11/14/2016 0830   CALCIUM 8.6 (L) 03/28/2015 0923   PROT 6.8 11/14/2016 0830   PROT 7.2 09/16/2015 0843   PROT 7.1 03/28/2015 0923   ALBUMIN 3.6 11/14/2016 0830   ALBUMIN 4.6 09/16/2015 0843   ALBUMIN 3.6 03/28/2015 0923   AST 40 11/14/2016 0830   AST 43 (H) 03/28/2015 0923   ALT 29 11/14/2016 0830   ALT 26 03/28/2015 0923   ALKPHOS 131 (H) 11/14/2016 0830   ALKPHOS 85 03/28/2015 0923   BILITOT 0.5 11/14/2016 0830   BILITOT 0.9 09/16/2015 0843   BILITOT 0.6 03/28/2015 0923   GFRNONAA >60 11/14/2016 0830   GFRNONAA >60 03/28/2015 0923   GFRAA >60 11/14/2016 0830   GFRAA >60 03/28/2015 0923    No results found for: SPEP, UPEP  Lab Results  Component Value Date   WBC 4.7 11/14/2016   NEUTROABS 2.7 11/14/2016   HGB 10.3 (L) 11/14/2016   HCT 31.0 (L) 11/14/2016   MCV 85.6 11/14/2016   PLT   186 11/14/2016      Chemistry      Component Value Date/Time   NA 137 11/14/2016 0830   NA 136 09/16/2015 0843   NA 138 03/28/2015 0923   K 3.8 11/14/2016  0830   K 3.3 (L) 03/28/2015 0923   CL 105 11/14/2016 0830   CL 101 03/28/2015 0923   CO2 24 11/14/2016 0830   CO2 28 03/28/2015 0923   BUN 8 11/14/2016 0830   BUN 10 09/16/2015 0843   BUN 9 03/28/2015 0923   CREATININE 0.62 11/14/2016 0830   CREATININE 0.56 03/28/2015 0923      Component Value Date/Time   CALCIUM 8.8 (L) 11/14/2016 0830   CALCIUM 8.6 (L) 03/28/2015 0923   ALKPHOS 131 (H) 11/14/2016 0830   ALKPHOS 85 03/28/2015 0923   AST 40 11/14/2016 0830   AST 43 (H) 03/28/2015 0923   ALT 29 11/14/2016 0830   ALT 26 03/28/2015 0923   BILITOT 0.5 11/14/2016 0830   BILITOT 0.9 09/16/2015 0843   BILITOT 0.6 03/28/2015 0923     IMPRESSION: 1. Progressive metastatic breast cancer, now with extensive involvement of the right pleural space and associated with a moderate to large right pleural effusion. 2. The left subpectoral and axillary lymph nodes have enlarged and are irregular. 3. There is evidence of diffuse osseous metastatic disease, similar to prior PET-CT, but the only active osseous lesion on prior PET-CT was the S1 metastatic lesion. 4. Hepatic cirrhosis, with bandlike infiltrative hypodensity in the right hepatic lobe and medial segment left hepatic lobe anteriorly probably from fatty infiltration. 5. Other imaging findings of potential clinical significance: Cardiomegaly. Coronary, aortic arch, and branch vessel atherosclerotic vascular disease. Aortoiliac atherosclerotic vascular disease. Peripheral fibrosis in the lungs.   Electronically Signed   By: Van Clines M.D.   On: 08/31/2016 13:24  RADIOGRAPHIC STUDIES: I have personally reviewed the radiological images as listed and agreed with the findings in the report. No results found.   ASSESSMENT & PLAN:  Carcinoma of upper-outer quadrant of left breast in female, estrogen receptor positive ([place]) Metastatic breast cancer to the bone/lung. Sep 2017- CT scan shows large right-sided pleural  effusion progressive disease.On taxol- Herceptin cycle weekly.  No evidence of progression.   # proceed with cycle #3 day 1 today. MUGA scan- 69% [sep 27th]; Plan CT C/A/P in 3 weeks.   # Right-sided pleural effusion status post thoracentesis. Likely malignant; although cytology negative. Clinically not worse at this time. Clinically seems to be improving.   # Bone metastases on Xgeva; start next week.   # follow up in 3  weeks/ labs/MD; weekly labs/ treatment.   Orders Placed This Encounter  Procedures  . CT ABDOMEN PELVIS W CONTRAST    Standing Status:   Future    Standing Expiration Date:   02/13/2018    Order Specific Question:   Reason for Exam (SYMPTOM  OR DIAGNOSIS REQUIRED)    Answer:   breast cancer    Order Specific Question:   Preferred imaging location?    Answer:   [facility]  . CT CHEST W CONTRAST    Standing Status:   Future    Standing Expiration Date:   01/14/2018    Order Specific Question:   Reason for Exam (SYMPTOM  OR DIAGNOSIS REQUIRED)    Answer:   breast cancer    Order Specific Question:   Preferred imaging location?    Answer:   [facility]  . CBC with Differential/Platelet  Standing Status:   Standing    Number of Occurrences:   3    Standing Expiration Date:   11/14/2017  . Comprehensive metabolic panel    Standing Status:   Future    Standing Expiration Date:   11/14/2017  . Basic metabolic panel    Standing Status:   Standing    Number of Occurrences:   2    Standing Expiration Date:   11/14/2017       Govinda R Brahmanday, MD 11/14/2016 10:05 AM 

## 2016-11-14 NOTE — Progress Notes (Signed)
Patient here today for follow up on Carcinoma of upper-outer quadrant of left breast in female.  Patient states that she is "doing good" no concerns.

## 2016-11-14 NOTE — Assessment & Plan Note (Addendum)
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 Sep 2017- CT scan shows large right-sided pleural effusion progressive disease.On taxol- Herceptin cycle weekly.  No evidence of progression.   # proceed with cycle #3 day 1 today. MUGA scan- 69% [sep 27th]; Plan CT C/A/P in 3 weeks.   # Right-sided pleural effusion status post thoracentesis. Likely malignant; although cytology negative. Clinically not worse at this time. Clinically seems to be improving.   # Bone metastases on Xgeva; start next week.   # follow up in 3  weeks/ labs/MD; weekly labs/ treatment.

## 2016-11-21 ENCOUNTER — Inpatient Hospital Stay: Payer: Medicare Other

## 2016-11-21 ENCOUNTER — Inpatient Hospital Stay: Payer: Medicare Other | Attending: Internal Medicine

## 2016-11-21 VITALS — BP 136/75 | HR 100 | Temp 96.8°F | Resp 18

## 2016-11-21 DIAGNOSIS — C50412 Malignant neoplasm of upper-outer quadrant of left female breast: Secondary | ICD-10-CM | POA: Diagnosis not present

## 2016-11-21 DIAGNOSIS — Z170 Estrogen receptor positive status [ER+]: Secondary | ICD-10-CM

## 2016-11-21 DIAGNOSIS — Z7982 Long term (current) use of aspirin: Secondary | ICD-10-CM | POA: Insufficient documentation

## 2016-11-21 DIAGNOSIS — Z7689 Persons encountering health services in other specified circumstances: Secondary | ICD-10-CM | POA: Diagnosis not present

## 2016-11-21 DIAGNOSIS — Z5112 Encounter for antineoplastic immunotherapy: Secondary | ICD-10-CM | POA: Insufficient documentation

## 2016-11-21 DIAGNOSIS — Z79899 Other long term (current) drug therapy: Secondary | ICD-10-CM | POA: Insufficient documentation

## 2016-11-21 DIAGNOSIS — J90 Pleural effusion, not elsewhere classified: Secondary | ICD-10-CM | POA: Insufficient documentation

## 2016-11-21 DIAGNOSIS — C7800 Secondary malignant neoplasm of unspecified lung: Secondary | ICD-10-CM | POA: Diagnosis not present

## 2016-11-21 DIAGNOSIS — Z5111 Encounter for antineoplastic chemotherapy: Secondary | ICD-10-CM | POA: Diagnosis not present

## 2016-11-21 DIAGNOSIS — C7951 Secondary malignant neoplasm of bone: Secondary | ICD-10-CM | POA: Insufficient documentation

## 2016-11-21 DIAGNOSIS — Z7984 Long term (current) use of oral hypoglycemic drugs: Secondary | ICD-10-CM | POA: Insufficient documentation

## 2016-11-21 DIAGNOSIS — Z86000 Personal history of in-situ neoplasm of breast: Secondary | ICD-10-CM | POA: Insufficient documentation

## 2016-11-21 DIAGNOSIS — I10 Essential (primary) hypertension: Secondary | ICD-10-CM | POA: Insufficient documentation

## 2016-11-21 DIAGNOSIS — C50919 Malignant neoplasm of unspecified site of unspecified female breast: Secondary | ICD-10-CM

## 2016-11-21 LAB — BASIC METABOLIC PANEL
ANION GAP: 7 (ref 5–15)
BUN: 13 mg/dL (ref 6–20)
CALCIUM: 9.2 mg/dL (ref 8.9–10.3)
CO2: 26 mmol/L (ref 22–32)
CREATININE: 0.67 mg/dL (ref 0.44–1.00)
Chloride: 105 mmol/L (ref 101–111)
Glucose, Bld: 267 mg/dL — ABNORMAL HIGH (ref 65–99)
Potassium: 3.7 mmol/L (ref 3.5–5.1)
SODIUM: 138 mmol/L (ref 135–145)

## 2016-11-21 LAB — CBC WITH DIFFERENTIAL/PLATELET
BASOS ABS: 0 10*3/uL (ref 0–0.1)
BASOS PCT: 1 %
EOS ABS: 0.2 10*3/uL (ref 0–0.7)
Eosinophils Relative: 4 %
HEMATOCRIT: 30.7 % — AB (ref 35.0–47.0)
Hemoglobin: 10.3 g/dL — ABNORMAL LOW (ref 12.0–16.0)
Lymphocytes Relative: 25 %
Lymphs Abs: 1.3 10*3/uL (ref 1.0–3.6)
MCH: 28.6 pg (ref 26.0–34.0)
MCHC: 33.5 g/dL (ref 32.0–36.0)
MCV: 85.2 fL (ref 80.0–100.0)
MONO ABS: 0.3 10*3/uL (ref 0.2–0.9)
MONOS PCT: 6 %
NEUTROS ABS: 3.4 10*3/uL (ref 1.4–6.5)
NEUTROS PCT: 64 %
Platelets: 190 10*3/uL (ref 150–440)
RBC: 3.6 MIL/uL — ABNORMAL LOW (ref 3.80–5.20)
RDW: 21.2 % — AB (ref 11.5–14.5)
WBC: 5.3 10*3/uL (ref 3.6–11.0)

## 2016-11-21 MED ORDER — FAMOTIDINE IN NACL 20-0.9 MG/50ML-% IV SOLN
20.0000 mg | Freq: Once | INTRAVENOUS | Status: AC
  Administered 2016-11-21: 20 mg via INTRAVENOUS
  Filled 2016-11-21: qty 50

## 2016-11-21 MED ORDER — HEPARIN SOD (PORK) LOCK FLUSH 100 UNIT/ML IV SOLN
500.0000 [IU] | Freq: Once | INTRAVENOUS | Status: AC | PRN
  Administered 2016-11-21: 500 [IU]
  Filled 2016-11-21: qty 5

## 2016-11-21 MED ORDER — ACETAMINOPHEN 325 MG PO TABS
650.0000 mg | ORAL_TABLET | Freq: Once | ORAL | Status: AC
  Administered 2016-11-21: 650 mg via ORAL
  Filled 2016-11-21: qty 2

## 2016-11-21 MED ORDER — SODIUM CHLORIDE 0.9 % IV SOLN
Freq: Once | INTRAVENOUS | Status: AC
  Administered 2016-11-21: 09:00:00 via INTRAVENOUS
  Filled 2016-11-21: qty 1000

## 2016-11-21 MED ORDER — DIPHENHYDRAMINE HCL 50 MG/ML IJ SOLN
50.0000 mg | Freq: Once | INTRAMUSCULAR | Status: AC
  Administered 2016-11-21: 50 mg via INTRAVENOUS
  Filled 2016-11-21: qty 1

## 2016-11-21 MED ORDER — SODIUM CHLORIDE 0.9 % IV SOLN
20.0000 mg | Freq: Once | INTRAVENOUS | Status: AC
  Administered 2016-11-21: 20 mg via INTRAVENOUS
  Filled 2016-11-21: qty 2

## 2016-11-21 MED ORDER — PACLITAXEL CHEMO INJECTION 300 MG/50ML
80.0000 mg/m2 | Freq: Once | INTRAVENOUS | Status: AC
  Administered 2016-11-21: 138 mg via INTRAVENOUS
  Filled 2016-11-21: qty 23

## 2016-11-21 MED ORDER — TRASTUZUMAB CHEMO 150 MG IV SOLR
2.0000 mg/kg | Freq: Once | INTRAVENOUS | Status: AC
  Administered 2016-11-21: 126 mg via INTRAVENOUS
  Filled 2016-11-21: qty 6

## 2016-11-28 ENCOUNTER — Inpatient Hospital Stay: Payer: Medicare Other

## 2016-11-28 VITALS — BP 130/80 | HR 88 | Temp 96.1°F | Resp 18

## 2016-11-28 DIAGNOSIS — C50412 Malignant neoplasm of upper-outer quadrant of left female breast: Secondary | ICD-10-CM | POA: Diagnosis not present

## 2016-11-28 DIAGNOSIS — Z170 Estrogen receptor positive status [ER+]: Principal | ICD-10-CM

## 2016-11-28 DIAGNOSIS — Z5112 Encounter for antineoplastic immunotherapy: Secondary | ICD-10-CM | POA: Diagnosis not present

## 2016-11-28 DIAGNOSIS — C7951 Secondary malignant neoplasm of bone: Secondary | ICD-10-CM | POA: Diagnosis not present

## 2016-11-28 DIAGNOSIS — C50919 Malignant neoplasm of unspecified site of unspecified female breast: Secondary | ICD-10-CM

## 2016-11-28 DIAGNOSIS — Z5111 Encounter for antineoplastic chemotherapy: Secondary | ICD-10-CM | POA: Diagnosis not present

## 2016-11-28 DIAGNOSIS — C7800 Secondary malignant neoplasm of unspecified lung: Secondary | ICD-10-CM | POA: Diagnosis not present

## 2016-11-28 LAB — CBC WITH DIFFERENTIAL/PLATELET
Basophils Absolute: 0.1 10*3/uL (ref 0–0.1)
Basophils Relative: 2 %
Eosinophils Absolute: 0.2 10*3/uL (ref 0–0.7)
Eosinophils Relative: 8 %
HEMATOCRIT: 29.6 % — AB (ref 35.0–47.0)
HEMOGLOBIN: 9.8 g/dL — AB (ref 12.0–16.0)
LYMPHS ABS: 1.2 10*3/uL (ref 1.0–3.6)
LYMPHS PCT: 40 %
MCH: 28.7 pg (ref 26.0–34.0)
MCHC: 33.2 g/dL (ref 32.0–36.0)
MCV: 86.3 fL (ref 80.0–100.0)
MONOS PCT: 10 %
Monocytes Absolute: 0.3 10*3/uL (ref 0.2–0.9)
NEUTROS PCT: 40 %
Neutro Abs: 1.2 10*3/uL — ABNORMAL LOW (ref 1.4–6.5)
Platelets: 191 10*3/uL (ref 150–440)
RBC: 3.43 MIL/uL — AB (ref 3.80–5.20)
RDW: 21.5 % — ABNORMAL HIGH (ref 11.5–14.5)
WBC: 3 10*3/uL — AB (ref 3.6–11.0)

## 2016-11-28 LAB — COMPREHENSIVE METABOLIC PANEL
ALK PHOS: 115 U/L (ref 38–126)
ALT: 33 U/L (ref 14–54)
AST: 45 U/L — ABNORMAL HIGH (ref 15–41)
Albumin: 3.9 g/dL (ref 3.5–5.0)
Anion gap: 8 (ref 5–15)
BILIRUBIN TOTAL: 0.8 mg/dL (ref 0.3–1.2)
BUN: 9 mg/dL (ref 6–20)
CALCIUM: 9.3 mg/dL (ref 8.9–10.3)
CO2: 25 mmol/L (ref 22–32)
CREATININE: 0.65 mg/dL (ref 0.44–1.00)
Chloride: 102 mmol/L (ref 101–111)
Glucose, Bld: 269 mg/dL — ABNORMAL HIGH (ref 65–99)
Potassium: 3.4 mmol/L — ABNORMAL LOW (ref 3.5–5.1)
SODIUM: 135 mmol/L (ref 135–145)
TOTAL PROTEIN: 7.2 g/dL (ref 6.5–8.1)

## 2016-11-28 MED ORDER — FAMOTIDINE IN NACL 20-0.9 MG/50ML-% IV SOLN
20.0000 mg | Freq: Once | INTRAVENOUS | Status: AC
  Administered 2016-11-28: 20 mg via INTRAVENOUS
  Filled 2016-11-28: qty 50

## 2016-11-28 MED ORDER — HEPARIN SOD (PORK) LOCK FLUSH 100 UNIT/ML IV SOLN
500.0000 [IU] | Freq: Once | INTRAVENOUS | Status: AC | PRN
  Administered 2016-11-28: 500 [IU]
  Filled 2016-11-28: qty 5

## 2016-11-28 MED ORDER — ACETAMINOPHEN 325 MG PO TABS
650.0000 mg | ORAL_TABLET | Freq: Once | ORAL | Status: AC
  Administered 2016-11-28: 650 mg via ORAL
  Filled 2016-11-28: qty 2

## 2016-11-28 MED ORDER — TRASTUZUMAB CHEMO 150 MG IV SOLR
2.0000 mg/kg | Freq: Once | INTRAVENOUS | Status: AC
  Administered 2016-11-28: 126 mg via INTRAVENOUS
  Filled 2016-11-28: qty 6

## 2016-11-28 MED ORDER — SODIUM CHLORIDE 0.9% FLUSH
10.0000 mL | INTRAVENOUS | Status: DC | PRN
  Administered 2016-11-28: 10 mL
  Filled 2016-11-28: qty 10

## 2016-11-28 MED ORDER — SODIUM CHLORIDE 0.9 % IV SOLN
80.0000 mg/m2 | Freq: Once | INTRAVENOUS | Status: AC
  Administered 2016-11-28: 138 mg via INTRAVENOUS
  Filled 2016-11-28: qty 23

## 2016-11-28 MED ORDER — DIPHENHYDRAMINE HCL 50 MG/ML IJ SOLN
50.0000 mg | Freq: Once | INTRAMUSCULAR | Status: AC
  Administered 2016-11-28: 50 mg via INTRAVENOUS
  Filled 2016-11-28: qty 1

## 2016-11-28 MED ORDER — SODIUM CHLORIDE 0.9 % IV SOLN
Freq: Once | INTRAVENOUS | Status: AC
  Administered 2016-11-28: 09:00:00 via INTRAVENOUS
  Filled 2016-11-28: qty 1000

## 2016-11-28 MED ORDER — SODIUM CHLORIDE 0.9 % IV SOLN
20.0000 mg | Freq: Once | INTRAVENOUS | Status: AC
  Administered 2016-11-28: 20 mg via INTRAVENOUS
  Filled 2016-11-28: qty 2

## 2016-11-28 NOTE — Progress Notes (Signed)
ANC 1.2.  MD ok to proceed with treatment

## 2016-11-28 NOTE — Progress Notes (Signed)
Pt's anc 1.2, MD aware and states continue treatment

## 2016-12-05 ENCOUNTER — Inpatient Hospital Stay (HOSPITAL_BASED_OUTPATIENT_CLINIC_OR_DEPARTMENT_OTHER): Payer: Medicare Other | Admitting: Internal Medicine

## 2016-12-05 ENCOUNTER — Inpatient Hospital Stay: Payer: Medicare Other

## 2016-12-05 VITALS — BP 163/83 | HR 108 | Temp 96.6°F | Resp 18 | Wt 131.5 lb

## 2016-12-05 VITALS — BP 156/74 | HR 83

## 2016-12-05 DIAGNOSIS — Z79899 Other long term (current) drug therapy: Secondary | ICD-10-CM

## 2016-12-05 DIAGNOSIS — Z5112 Encounter for antineoplastic immunotherapy: Secondary | ICD-10-CM | POA: Diagnosis not present

## 2016-12-05 DIAGNOSIS — Z86000 Personal history of in-situ neoplasm of breast: Secondary | ICD-10-CM

## 2016-12-05 DIAGNOSIS — Z170 Estrogen receptor positive status [ER+]: Principal | ICD-10-CM

## 2016-12-05 DIAGNOSIS — Z5111 Encounter for antineoplastic chemotherapy: Secondary | ICD-10-CM | POA: Diagnosis not present

## 2016-12-05 DIAGNOSIS — Z7689 Persons encountering health services in other specified circumstances: Secondary | ICD-10-CM

## 2016-12-05 DIAGNOSIS — J90 Pleural effusion, not elsewhere classified: Secondary | ICD-10-CM

## 2016-12-05 DIAGNOSIS — C7800 Secondary malignant neoplasm of unspecified lung: Secondary | ICD-10-CM | POA: Diagnosis not present

## 2016-12-05 DIAGNOSIS — C50412 Malignant neoplasm of upper-outer quadrant of left female breast: Secondary | ICD-10-CM

## 2016-12-05 DIAGNOSIS — I10 Essential (primary) hypertension: Secondary | ICD-10-CM

## 2016-12-05 DIAGNOSIS — C50919 Malignant neoplasm of unspecified site of unspecified female breast: Secondary | ICD-10-CM

## 2016-12-05 DIAGNOSIS — Z7982 Long term (current) use of aspirin: Secondary | ICD-10-CM

## 2016-12-05 DIAGNOSIS — C7951 Secondary malignant neoplasm of bone: Secondary | ICD-10-CM

## 2016-12-05 DIAGNOSIS — Z7984 Long term (current) use of oral hypoglycemic drugs: Secondary | ICD-10-CM

## 2016-12-05 LAB — CBC WITH DIFFERENTIAL/PLATELET
Basophils Absolute: 0 10*3/uL (ref 0–0.1)
Basophils Relative: 1 %
Eosinophils Absolute: 0.1 10*3/uL (ref 0–0.7)
Eosinophils Relative: 3 %
HEMATOCRIT: 29 % — AB (ref 35.0–47.0)
Hemoglobin: 9.7 g/dL — ABNORMAL LOW (ref 12.0–16.0)
Lymphocytes Relative: 33 %
Lymphs Abs: 1 10*3/uL (ref 1.0–3.6)
MCH: 29.1 pg (ref 26.0–34.0)
MCHC: 33.6 g/dL (ref 32.0–36.0)
MCV: 86.8 fL (ref 80.0–100.0)
MONO ABS: 0.2 10*3/uL (ref 0.2–0.9)
MONOS PCT: 7 %
NEUTROS ABS: 1.6 10*3/uL (ref 1.4–6.5)
Neutrophils Relative %: 56 %
Platelets: 196 10*3/uL (ref 150–440)
RBC: 3.34 MIL/uL — ABNORMAL LOW (ref 3.80–5.20)
RDW: 22 % — AB (ref 11.5–14.5)
WBC: 3 10*3/uL — ABNORMAL LOW (ref 3.6–11.0)

## 2016-12-05 LAB — BASIC METABOLIC PANEL
Anion gap: 7 (ref 5–15)
BUN: 8 mg/dL (ref 6–20)
CALCIUM: 9.1 mg/dL (ref 8.9–10.3)
CO2: 26 mmol/L (ref 22–32)
CREATININE: 0.63 mg/dL (ref 0.44–1.00)
Chloride: 105 mmol/L (ref 101–111)
GFR calc Af Amer: >60 mL/min (ref >60)
GFR calc non Af Amer: >60 mL/min (ref >60)
GLUCOSE: 262 mg/dL — AB (ref 65–99)
Potassium: 3.3 mmol/L — ABNORMAL LOW (ref 3.5–5.1)
Sodium: 138 mmol/L (ref 135–145)

## 2016-12-05 MED ORDER — HEPARIN SOD (PORK) LOCK FLUSH 100 UNIT/ML IV SOLN
500.0000 [IU] | Freq: Once | INTRAVENOUS | Status: AC | PRN
  Administered 2016-12-05: 500 [IU]
  Filled 2016-12-05: qty 5

## 2016-12-05 MED ORDER — SODIUM CHLORIDE 0.9% FLUSH
10.0000 mL | INTRAVENOUS | Status: DC | PRN
  Administered 2016-12-05: 10 mL
  Filled 2016-12-05: qty 10

## 2016-12-05 MED ORDER — SODIUM CHLORIDE 0.9 % IV SOLN
80.0000 mg/m2 | Freq: Once | INTRAVENOUS | Status: AC
  Administered 2016-12-05: 138 mg via INTRAVENOUS
  Filled 2016-12-05: qty 23

## 2016-12-05 MED ORDER — TRASTUZUMAB CHEMO 150 MG IV SOLR
2.0000 mg/kg | Freq: Once | INTRAVENOUS | Status: AC
  Administered 2016-12-05: 126 mg via INTRAVENOUS
  Filled 2016-12-05: qty 6

## 2016-12-05 MED ORDER — SODIUM CHLORIDE 0.9 % IV SOLN
20.0000 mg | Freq: Once | INTRAVENOUS | Status: AC
  Administered 2016-12-05: 20 mg via INTRAVENOUS
  Filled 2016-12-05: qty 2

## 2016-12-05 MED ORDER — ACETAMINOPHEN 325 MG PO TABS
650.0000 mg | ORAL_TABLET | Freq: Once | ORAL | Status: AC
  Administered 2016-12-05: 650 mg via ORAL
  Filled 2016-12-05: qty 2

## 2016-12-05 MED ORDER — SODIUM CHLORIDE 0.9 % IV SOLN
Freq: Once | INTRAVENOUS | Status: AC
  Administered 2016-12-05: 10:00:00 via INTRAVENOUS
  Filled 2016-12-05: qty 1000

## 2016-12-05 MED ORDER — DIPHENHYDRAMINE HCL 50 MG/ML IJ SOLN
50.0000 mg | Freq: Once | INTRAMUSCULAR | Status: AC
  Administered 2016-12-05: 50 mg via INTRAVENOUS
  Filled 2016-12-05: qty 1

## 2016-12-05 MED ORDER — FAMOTIDINE IN NACL 20-0.9 MG/50ML-% IV SOLN
20.0000 mg | Freq: Once | INTRAVENOUS | Status: AC
  Administered 2016-12-05: 20 mg via INTRAVENOUS
  Filled 2016-12-05: qty 50

## 2016-12-05 NOTE — Progress Notes (Signed)
Patient offers no complaints today.  BP elevated today 163/83  HR 108.

## 2016-12-05 NOTE — Progress Notes (Signed)
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  Estrogen receptor 90%.  Progesterone receptor 60%.  Patient had lumpectomy and MammoSite partial breast radiation therapy followed by tamoxifen for 5 years.  Patient is still taking tamoxifen.. 2 recent mammogram in December of 2015 negative.  Further evaluation by surgeon revealed a palpable mass in the left upper outer quadrant near axilla.  Ultrasound revealed one small normal-appearing lymph node 0.6 cm in lower limit of axilla.  Adjacent to this there was ill-defined multilobulated hypoechoic mass 1.8 x 2.6 cm.  Biopsy of the left axillary mass was done which was positive for high-grade carcinoma.   2,estrogen receptor positive.  Progesterone receptor positive.  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 3.  MRI scan of breast (January, 2015) reveals multiple abnormal enlarged left axillary lymph node There are 2 oval some circumscribed mass at 2:30 and 3:00 position of the left breast.  0.6 cm x 1.3 cm and 4 x 7 mm Non-mass enhancement extending 2.4 cm these masses is 3.6 cm mass.  Findings suggestive of several pulmonary nodules.  PET  scan shows extensive disease, 4.started on Taxotere,perjeta, Herceptin(January 03, 2015- June 2016.   # June 2016- Herceptin [Letrozole + Ibrance] until May 2017.   # MAY 2017- PET PROGRESSION; # July 2017- START KADCYLA  x4 cycles- SEP 15th 2017CT- PROGRESSION  # SEP 2017- START Taxol + Herceptin.   # Right sided Pleural effusion [s/p Thora Sep  2017]  # Bone mets- X-geva q 6W       Breast cancer ([place])   12/08/2013 Initial Diagnosis    Breast cancer       Malignant neoplasm of axillary tail of female breast ([place])   12/16/2014 Initial Diagnosis    Malignant neoplasm of axilla       Primary malignant neoplasm of upper outer quadrant of left breast ([place])   07/27/2016 Initial Diagnosis    Primary malignant neoplasm of upper outer quadrant of left breast ([place])       Carcinoma of upper-outer quadrant of left breast in female, estrogen receptor positive ([place])   09/28/2016 Initial Diagnosis    Carcinoma of upper-outer quadrant of left breast in female, estrogen receptor positive ([place])       INTERVAL HISTORY:  Tammy Pope 70 y.o.  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   Her appetite is good. Denies any new onset of skin rash. Denies any tingling or numbness. States her left upper outer quadrant breast mass improving.  Denies any worsening shortness of breath. Denies any bone pain. No fevers or chills.   REVIEW OF SYSTEMS:  A complete 10 point review of system is done which is negative except mentioned above/history of present illness.   PAST MEDICAL HISTORY :  Past Medical History:  Diagnosis Date  . Axillary mass 12/15/2014  . Breast cancer ([place]) 02/21/2009  . 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 Floaters 2014  . Hyperlipidemia 1990  . Hypertension 2009  . Malignant neoplasm of axillary tail of female breast ([place]) 12/16/2014  . Malignant neoplasm of upper-outer quadrant of female breast [facility]) February 21, 2009   DCIS of the right breast, intermediate grade, resected the negative margins. ER 90%, PR 50%, MammoSite partial breast radiation  . 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 Special screening for malignant neoplasms,  colon     PAST SURGICAL HISTORY :   Past Surgical History:  Procedure Laterality Date  . APPENDECTOMY  1956  . BREAST BIOPSY Right 2010   stereo  . BREAST SURGERY Right 2010   wide excision  . COLONOSCOPY  2008   ??? Md  . left axilla biopsy Left 12-14-14  . PARTIAL HYSTERECTOMY  1977    FAMILY HISTORY :   Family History  Problem Relation Age of Onset  . Cancer Other     unknown family members with ovarian,colon,breast cancers  . Hypertension Mother   . Heart disease Daughter     MI    SOCIAL HISTORY:   Social History  Substance Use Topics  . Smoking status: Never Smoker  . Smokeless tobacco: Never Used  . Alcohol use No    ALLERGIES:  is allergic to penicillin g and sulfa antibiotics.  MEDICATIONS:  Current Outpatient Prescriptions  Medication Sig Dispense Refill  . ALPRAZolam (XANAX) 0.5 MG tablet Take 1 tablet (0.5 mg total) by mouth 3 (three) times daily as needed. 90 tablet 2  . amLODipine (NORVASC) 5 MG tablet TAKE 1 TABLET (5 MG TOTAL) BY MOUTH DAILY. 30 tablet 3  . aspirin 81 MG tablet Take 81 mg by mouth daily.    . Calcium 600-200 MG-UNIT tablet Take 1 tablet by mouth 2 (two) times daily. 120 tablet 3  . metFORMIN (GLUCOPHAGE) 500 MG tablet Take 2 tablets (1,000 mg total) by mouth 2 (two) times daily with a meal. 360 tablet 5  . Multiple Vitamins-Minerals (MULTIVITAMIN ADULT PO) Take by mouth.    . ONE TOUCH ULTRA TEST test strip     . potassium chloride SA (KLOR-CON M20) 20 MEQ tablet Take 2 tablets (40 mEq total) by mouth daily. 60 tablet 3  . rosuvastatin (CRESTOR) 5 MG tablet TAKE 1 TABLET (5 MG TOTAL) BY MOUTH DAILY. 90 tablet 1  . valsartan-hydrochlorothiazide (DIOVAN-HCT) 320-25 MG tablet TAKE 1 TABLET BY MOUTH DAILY 30 tablet 6   No current facility-administered medications for this visit.    Facility-Administered Medications Ordered in Other Visits  Medication Dose Route Frequency Provider Last Rate Last Dose  . heparin lock flush 100 unit/mL   250 Units Intracatheter PRN Forest Gleason, MD      . sodium chloride 0.9 % 250 mL with potassium chloride 40 mEq infusion   Intravenous Continuous Forest Gleason, MD   Stopped at 05/03/15 1229  . sodium chloride 0.9 % injection 10 mL  10 mL Intracatheter PRN Forest Gleason, MD   10 mL at 04/18/15 1545    PHYSICAL EXAMINATION: ECOG PERFORMANCE STATUS: 0 - Asymptomatic  BP (!) 163/83 (BP Location: Left Arm, Patient Position: Sitting)   Pulse (!) 108   Temp (!) 96.6 F (35.9 C) (Tympanic)   Resp 18   Wt 131 lb 8 oz (59.6 kg)   LMP  (LMP Unknown)   BMI 23.29 kg/m   Filed Weights   12/05/16 0853  Weight: 131 lb  8 oz (59.6 kg)    GENERAL: Well-nourished well-developed; Alert, no distress and comfortable.   Accompanied by her husband. EYES: no pallor or icterus OROPHARYNX: no thrush or ulceration; poor dentition  NECK: supple, no masses felt LYMPH:  no palpable lymphadenopathy in the cervical, axillary or inguinal regions LUNGS: clear to auscultation and  No wheeze or crackles HEART/CVS: regular rate & rhythm and no murmurs; No lower extremity edema ABDOMEN:abdomen soft, non-tender and normal bowel sounds Musculoskeletal:no cyanosis of digits and no clubbing  PSYCH: alert & oriented x 3 with fluent speech NEURO: no focal motor/sensory deficits SKIN:  no rashes or significant lesions LEFT BREAST- 1-2cm mass left upper outer quadrant/axillary tail. Nontender. Skin: macular rash noted on the bilateral extremities  LABORATORY DATA:  I have reviewed the data as listed    Component Value Date/Time   NA 138 12/05/2016 0836   NA 136 09/16/2015 0843   NA 138 03/28/2015 0923   K 3.3 (L) 12/05/2016 0836   K 3.3 (L) 03/28/2015 0923   CL 105 12/05/2016 0836   CL 101 03/28/2015 0923   CO2 26 12/05/2016 0836   CO2 28 03/28/2015 0923   GLUCOSE 262 (H) 12/05/2016 0836   GLUCOSE 173 (H) 03/28/2015 0923   BUN 8 12/05/2016 0836   BUN 10 09/16/2015 0843   BUN 9 03/28/2015 0923   CREATININE  0.63 12/05/2016 0836   CREATININE 0.56 03/28/2015 0923   CALCIUM 9.1 12/05/2016 0836   CALCIUM 8.6 (L) 03/28/2015 0923   PROT 7.2 11/28/2016 0826   PROT 7.2 09/16/2015 0843   PROT 7.1 03/28/2015 0923   ALBUMIN 3.9 11/28/2016 0826   ALBUMIN 4.6 09/16/2015 0843   ALBUMIN 3.6 03/28/2015 0923   AST 45 (H) 11/28/2016 0826   AST 43 (H) 03/28/2015 0923   ALT 33 11/28/2016 0826   ALT 26 03/28/2015 0923   ALKPHOS 115 11/28/2016 0826   ALKPHOS 85 03/28/2015 0923   BILITOT 0.8 11/28/2016 0826   BILITOT 0.9 09/16/2015 0843   BILITOT 0.6 03/28/2015 0923   GFRNONAA >60 12/05/2016 0836   GFRNONAA >60 03/28/2015 0923   GFRAA >60 12/05/2016 0836   GFRAA >60 03/28/2015 0923    No results found for: SPEP, UPEP  Lab Results  Component Value Date   WBC 3.0 (L) 12/05/2016   NEUTROABS 1.6 12/05/2016   HGB 9.7 (L) 12/05/2016   HCT 29.0 (L) 12/05/2016   MCV 86.8 12/05/2016   PLT 196 12/05/2016      Chemistry      Component Value Date/Time   NA 138 12/05/2016 0836   NA 136 09/16/2015 0843   NA 138 03/28/2015 0923   K 3.3 (L) 12/05/2016 0836   K 3.3 (L) 03/28/2015 0923   CL 105 12/05/2016 0836   CL 101 03/28/2015 0923   CO2 26 12/05/2016 0836   CO2 28 03/28/2015 0923   BUN 8 12/05/2016 0836   BUN 10 09/16/2015 0843   BUN 9 03/28/2015 0923   CREATININE 0.63 12/05/2016 0836   CREATININE 0.56 03/28/2015 0923      Component Value Date/Time   CALCIUM 9.1 12/05/2016 0836   CALCIUM 8.6 (L) 03/28/2015 0923   ALKPHOS 115 11/28/2016 0826   ALKPHOS 85 03/28/2015 0923   AST 45 (H) 11/28/2016 0826   AST 43 (H) 03/28/2015 0923   ALT 33 11/28/2016 0826   ALT 26 03/28/2015 0923   BILITOT 0.8 11/28/2016 0826   BILITOT 0.9 09/16/2015 0843   BILITOT 0.6 03/28/2015 7001  IMPRESSION: 1. Progressive metastatic breast cancer, now with extensive involvement of the right pleural space and associated with a moderate to large right pleural effusion. 2. The left subpectoral and axillary lymph  nodes have enlarged and are irregular. 3. There is evidence of diffuse osseous metastatic disease, similar to prior PET-CT, but the only active osseous lesion on prior PET-CT was the S1 metastatic lesion. 4. Hepatic cirrhosis, with bandlike infiltrative hypodensity in the right hepatic lobe and medial segment left hepatic lobe anteriorly probably from fatty infiltration. 5. Other imaging findings of potential clinical significance: Cardiomegaly. Coronary, aortic arch, and branch vessel atherosclerotic vascular disease. Aortoiliac atherosclerotic vascular disease. Peripheral fibrosis in the lungs.   Electronically Signed   By: Van Clines M.D.   On: 08/31/2016 13:24  RADIOGRAPHIC STUDIES: I have personally reviewed the radiological images as listed and agreed with the findings in the report. No results found.   ASSESSMENT & PLAN:  Carcinoma of upper-outer quadrant of left breast in female, estrogen receptor positive ([place]) Metastatic breast cancer to the bone/lung. Sep 2017- CT scan shows large right-sided pleural effusion progressive disease.On taxol- Herceptin cycle weekly.  No evidence of progression.   # proceed with cycle #3 day 22 today. MUGA scan- 69% [sep 27th]; tolerated treatment well.  # Right-sided pleural effusion status post thoracentesis. Likely malignant; although cytology negative. Clinically not worse at this time. Clinically seems to be improving.   # Bone metastases on Xgeva;    # Chemotherapy cycle #4 day 1 on Jan third; follow-up with me on January 10 chemotherapy labs. CT scan first week of January 2018.  Orders Placed This Encounter  Procedures  . CT ABDOMEN PELVIS W CONTRAST    Standing Status:   Future    Standing Expiration Date:   03/06/2018    Order Specific Question:   Reason for Exam (SYMPTOM  OR DIAGNOSIS REQUIRED)    Answer:   breast cancer    Order Specific Question:   Preferred imaging location?    Answer:   [facility]  . CT  CHEST W CONTRAST    Standing Status:   Future    Standing Expiration Date:   02/04/2018    Order Specific Question:   Reason for Exam (SYMPTOM  OR DIAGNOSIS REQUIRED)    Answer:   breast cancer    Order Specific Question:   Preferred imaging location?    Answer:   [facility]  . Comprehensive metabolic panel    Standing Status:   Future    Standing Expiration Date:   06/05/2017  . CBC with Differential/Platelet    Standing Status:   Future    Standing Expiration Date:   06/05/2017  . CBC with Differential/Platelet    Standing Status:   Future    Standing Expiration Date:   12/05/2017  . Comprehensive metabolic panel    Standing Status:   Future    Standing Expiration Date:   12/05/2017       Cammie Sickle, MD 12/05/2016 6:16 PM

## 2016-12-05 NOTE — Assessment & Plan Note (Addendum)
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 Sep 2017- CT scan shows large right-sided pleural effusion progressive disease.On taxol- Herceptin cycle weekly.  No evidence of progression.   # proceed with cycle #3 day 22 today. MUGA scan- 69% [sep 27th]; tolerated treatment well.  # Right-sided pleural effusion status post thoracentesis. Likely malignant; although cytology negative. Clinically not worse at this time. Clinically seems to be improving.   # Bone metastases on Xgeva;    # Chemotherapy cycle #4 day 1 on Jan third; follow-up with me on January 10 chemotherapy labs. CT scan first week of January 2018.

## 2016-12-14 ENCOUNTER — Ambulatory Visit: Payer: Medicare Other | Admitting: Unknown Physician Specialty

## 2016-12-19 ENCOUNTER — Other Ambulatory Visit: Payer: Self-pay | Admitting: Internal Medicine

## 2016-12-19 ENCOUNTER — Inpatient Hospital Stay: Payer: Medicare Other | Attending: Internal Medicine

## 2016-12-19 ENCOUNTER — Ambulatory Visit: Payer: Medicare Other | Admitting: Unknown Physician Specialty

## 2016-12-19 ENCOUNTER — Inpatient Hospital Stay: Payer: Medicare Other

## 2016-12-19 VITALS — BP 155/88 | HR 82 | Resp 20

## 2016-12-19 DIAGNOSIS — Z7982 Long term (current) use of aspirin: Secondary | ICD-10-CM | POA: Diagnosis not present

## 2016-12-19 DIAGNOSIS — Z853 Personal history of malignant neoplasm of breast: Secondary | ICD-10-CM | POA: Insufficient documentation

## 2016-12-19 DIAGNOSIS — C7951 Secondary malignant neoplasm of bone: Secondary | ICD-10-CM | POA: Diagnosis not present

## 2016-12-19 DIAGNOSIS — Z79899 Other long term (current) drug therapy: Secondary | ICD-10-CM | POA: Diagnosis not present

## 2016-12-19 DIAGNOSIS — Z170 Estrogen receptor positive status [ER+]: Principal | ICD-10-CM

## 2016-12-19 DIAGNOSIS — E785 Hyperlipidemia, unspecified: Secondary | ICD-10-CM | POA: Insufficient documentation

## 2016-12-19 DIAGNOSIS — C50919 Malignant neoplasm of unspecified site of unspecified female breast: Secondary | ICD-10-CM

## 2016-12-19 DIAGNOSIS — Z7984 Long term (current) use of oral hypoglycemic drugs: Secondary | ICD-10-CM | POA: Insufficient documentation

## 2016-12-19 DIAGNOSIS — Z923 Personal history of irradiation: Secondary | ICD-10-CM | POA: Insufficient documentation

## 2016-12-19 DIAGNOSIS — I10 Essential (primary) hypertension: Secondary | ICD-10-CM | POA: Diagnosis not present

## 2016-12-19 DIAGNOSIS — Z809 Family history of malignant neoplasm, unspecified: Secondary | ICD-10-CM | POA: Diagnosis not present

## 2016-12-19 DIAGNOSIS — Z5112 Encounter for antineoplastic immunotherapy: Secondary | ICD-10-CM | POA: Insufficient documentation

## 2016-12-19 DIAGNOSIS — C50412 Malignant neoplasm of upper-outer quadrant of left female breast: Secondary | ICD-10-CM | POA: Insufficient documentation

## 2016-12-19 DIAGNOSIS — E119 Type 2 diabetes mellitus without complications: Secondary | ICD-10-CM | POA: Diagnosis not present

## 2016-12-19 DIAGNOSIS — Z5111 Encounter for antineoplastic chemotherapy: Secondary | ICD-10-CM | POA: Diagnosis not present

## 2016-12-19 DIAGNOSIS — J90 Pleural effusion, not elsewhere classified: Secondary | ICD-10-CM | POA: Diagnosis not present

## 2016-12-19 LAB — COMPREHENSIVE METABOLIC PANEL
ALT: 28 U/L (ref 14–54)
ANION GAP: 9 (ref 5–15)
AST: 43 U/L — ABNORMAL HIGH (ref 15–41)
Albumin: 4 g/dL (ref 3.5–5.0)
Alkaline Phosphatase: 129 U/L — ABNORMAL HIGH (ref 38–126)
BUN: 11 mg/dL (ref 6–20)
CHLORIDE: 105 mmol/L (ref 101–111)
CO2: 24 mmol/L (ref 22–32)
Calcium: 9 mg/dL (ref 8.9–10.3)
Creatinine, Ser: 0.78 mg/dL (ref 0.44–1.00)
GFR calc non Af Amer: >60 mL/min (ref >60)
Glucose, Bld: 255 mg/dL — ABNORMAL HIGH (ref 65–99)
POTASSIUM: 3.5 mmol/L (ref 3.5–5.1)
SODIUM: 138 mmol/L (ref 135–145)
Total Bilirubin: 0.8 mg/dL (ref 0.3–1.2)
Total Protein: 7.4 g/dL (ref 6.5–8.1)

## 2016-12-19 LAB — CBC WITH DIFFERENTIAL/PLATELET
BASOS ABS: 0.1 10*3/uL (ref 0–0.1)
BASOS PCT: 1 %
EOS ABS: 0.2 10*3/uL (ref 0–0.7)
EOS PCT: 4 %
HCT: 33.7 % — ABNORMAL LOW (ref 35.0–47.0)
Hemoglobin: 11.1 g/dL — ABNORMAL LOW (ref 12.0–16.0)
Lymphocytes Relative: 30 %
Lymphs Abs: 1.5 10*3/uL (ref 1.0–3.6)
MCH: 28.8 pg (ref 26.0–34.0)
MCHC: 32.8 g/dL (ref 32.0–36.0)
MCV: 88 fL (ref 80.0–100.0)
MONO ABS: 0.6 10*3/uL (ref 0.2–0.9)
Monocytes Relative: 12 %
NEUTROS ABS: 2.5 10*3/uL (ref 1.4–6.5)
NEUTROS PCT: 53 %
PLATELETS: 193 10*3/uL (ref 150–440)
RBC: 3.84 MIL/uL (ref 3.80–5.20)
RDW: 21.5 % — AB (ref 11.5–14.5)
WBC: 4.8 10*3/uL (ref 3.6–11.0)

## 2016-12-19 MED ORDER — DIPHENHYDRAMINE HCL 50 MG/ML IJ SOLN
50.0000 mg | Freq: Once | INTRAMUSCULAR | Status: AC
  Administered 2016-12-19: 50 mg via INTRAVENOUS
  Filled 2016-12-19: qty 1

## 2016-12-19 MED ORDER — ACETAMINOPHEN 325 MG PO TABS
650.0000 mg | ORAL_TABLET | Freq: Once | ORAL | Status: AC
  Administered 2016-12-19: 650 mg via ORAL
  Filled 2016-12-19: qty 2

## 2016-12-19 MED ORDER — PACLITAXEL CHEMO INJECTION 300 MG/50ML: 80.0000 mg/m2 | Freq: Once | INTRAVENOUS | Status: DC

## 2016-12-19 MED ORDER — SODIUM CHLORIDE 0.9 % IV SOLN
20.0000 mg | Freq: Once | INTRAVENOUS | Status: AC
  Administered 2016-12-19: 20 mg via INTRAVENOUS
  Filled 2016-12-19: qty 2

## 2016-12-19 MED ORDER — FAMOTIDINE IN NACL 20-0.9 MG/50ML-% IV SOLN
20.0000 mg | Freq: Once | INTRAVENOUS | Status: AC
  Administered 2016-12-19: 20 mg via INTRAVENOUS
  Filled 2016-12-19: qty 50

## 2016-12-19 MED ORDER — SODIUM CHLORIDE 0.9 % IV SOLN
80.0000 mg/m2 | Freq: Once | INTRAVENOUS | Status: AC
  Administered 2016-12-19: 138 mg via INTRAVENOUS
  Filled 2016-12-19: qty 23

## 2016-12-19 MED ORDER — SODIUM CHLORIDE 0.9 % IV SOLN
Freq: Once | INTRAVENOUS | Status: AC
  Administered 2016-12-19: 09:00:00 via INTRAVENOUS
  Filled 2016-12-19: qty 1000

## 2016-12-19 MED ORDER — HEPARIN SOD (PORK) LOCK FLUSH 100 UNIT/ML IV SOLN
500.0000 [IU] | Freq: Once | INTRAVENOUS | Status: AC | PRN
  Administered 2016-12-19: 500 [IU]
  Filled 2016-12-19: qty 5

## 2016-12-19 MED ORDER — SODIUM CHLORIDE 0.9 % IV SOLN
2.0000 mg/kg | Freq: Once | INTRAVENOUS | Status: AC
  Administered 2016-12-19: 126 mg via INTRAVENOUS
  Filled 2016-12-19: qty 6

## 2016-12-21 ENCOUNTER — Ambulatory Visit
Admission: RE | Admit: 2016-12-21 | Discharge: 2016-12-21 | Disposition: A | Discharge status: Home / Self Care | Payer: Medicare Other | Source: Ambulatory Visit | Attending: Internal Medicine | Admitting: Internal Medicine

## 2016-12-21 ENCOUNTER — Other Ambulatory Visit: Payer: Self-pay | Admitting: *Deleted

## 2016-12-21 DIAGNOSIS — R918 Other nonspecific abnormal finding of lung field: Secondary | ICD-10-CM | POA: Insufficient documentation

## 2016-12-21 DIAGNOSIS — R932 Abnormal findings on diagnostic imaging of liver and biliary tract: Secondary | ICD-10-CM | POA: Diagnosis not present

## 2016-12-21 DIAGNOSIS — C50412 Malignant neoplasm of upper-outer quadrant of left female breast: Secondary | ICD-10-CM

## 2016-12-21 DIAGNOSIS — Z170 Estrogen receptor positive status [ER+]: Secondary | ICD-10-CM | POA: Diagnosis not present

## 2016-12-21 DIAGNOSIS — C7951 Secondary malignant neoplasm of bone: Secondary | ICD-10-CM | POA: Diagnosis not present

## 2016-12-21 DIAGNOSIS — C50912 Malignant neoplasm of unspecified site of left female breast: Secondary | ICD-10-CM | POA: Diagnosis not present

## 2016-12-21 DIAGNOSIS — Z5181 Encounter for therapeutic drug level monitoring: Secondary | ICD-10-CM

## 2016-12-21 DIAGNOSIS — Z79899 Other long term (current) drug therapy: Secondary | ICD-10-CM

## 2016-12-21 HISTORY — DX: Type 2 diabetes mellitus without complications: E11.9

## 2016-12-21 MED ORDER — IOPAMIDOL (ISOVUE-300) INJECTION 61%
100.0000 mL | Freq: Once | INTRAVENOUS | Status: AC | PRN
  Administered 2016-12-21: 100 mL via INTRAVENOUS

## 2016-12-24 ENCOUNTER — Ambulatory Visit: Payer: Medicare Other | Admitting: Unknown Physician Specialty

## 2016-12-26 ENCOUNTER — Inpatient Hospital Stay: Payer: Medicare Other

## 2016-12-26 ENCOUNTER — Inpatient Hospital Stay (HOSPITAL_BASED_OUTPATIENT_CLINIC_OR_DEPARTMENT_OTHER): Payer: Medicare Other | Admitting: Internal Medicine

## 2016-12-26 DIAGNOSIS — I10 Essential (primary) hypertension: Secondary | ICD-10-CM | POA: Diagnosis not present

## 2016-12-26 DIAGNOSIS — E119 Type 2 diabetes mellitus without complications: Secondary | ICD-10-CM

## 2016-12-26 DIAGNOSIS — Z5111 Encounter for antineoplastic chemotherapy: Secondary | ICD-10-CM | POA: Diagnosis not present

## 2016-12-26 DIAGNOSIS — Z853 Personal history of malignant neoplasm of breast: Secondary | ICD-10-CM

## 2016-12-26 DIAGNOSIS — J90 Pleural effusion, not elsewhere classified: Secondary | ICD-10-CM | POA: Diagnosis not present

## 2016-12-26 DIAGNOSIS — E785 Hyperlipidemia, unspecified: Secondary | ICD-10-CM | POA: Diagnosis not present

## 2016-12-26 DIAGNOSIS — Z170 Estrogen receptor positive status [ER+]: Secondary | ICD-10-CM | POA: Diagnosis not present

## 2016-12-26 DIAGNOSIS — Z79899 Other long term (current) drug therapy: Secondary | ICD-10-CM

## 2016-12-26 DIAGNOSIS — Z7982 Long term (current) use of aspirin: Secondary | ICD-10-CM

## 2016-12-26 DIAGNOSIS — Z809 Family history of malignant neoplasm, unspecified: Secondary | ICD-10-CM

## 2016-12-26 DIAGNOSIS — C50919 Malignant neoplasm of unspecified site of unspecified female breast: Secondary | ICD-10-CM

## 2016-12-26 DIAGNOSIS — C50412 Malignant neoplasm of upper-outer quadrant of left female breast: Secondary | ICD-10-CM | POA: Diagnosis not present

## 2016-12-26 DIAGNOSIS — Z7984 Long term (current) use of oral hypoglycemic drugs: Secondary | ICD-10-CM

## 2016-12-26 DIAGNOSIS — Z923 Personal history of irradiation: Secondary | ICD-10-CM | POA: Diagnosis not present

## 2016-12-26 DIAGNOSIS — C7951 Secondary malignant neoplasm of bone: Secondary | ICD-10-CM

## 2016-12-26 DIAGNOSIS — Z7189 Other specified counseling: Secondary | ICD-10-CM

## 2016-12-26 DIAGNOSIS — Z5112 Encounter for antineoplastic immunotherapy: Secondary | ICD-10-CM | POA: Diagnosis not present

## 2016-12-26 LAB — COMPREHENSIVE METABOLIC PANEL
ALBUMIN: 3.8 g/dL (ref 3.5–5.0)
ALK PHOS: 133 U/L — AB (ref 38–126)
ALT: 37 U/L (ref 14–54)
ANION GAP: 7 (ref 5–15)
AST: 48 U/L — ABNORMAL HIGH (ref 15–41)
BILIRUBIN TOTAL: 0.8 mg/dL (ref 0.3–1.2)
BUN: 9 mg/dL (ref 6–20)
CALCIUM: 8.8 mg/dL — AB (ref 8.9–10.3)
CO2: 24 mmol/L (ref 22–32)
CREATININE: 0.55 mg/dL (ref 0.44–1.00)
Chloride: 108 mmol/L (ref 101–111)
GFR calc Af Amer: >60 mL/min (ref >60)
GFR calc non Af Amer: >60 mL/min (ref >60)
Glucose, Bld: 251 mg/dL — ABNORMAL HIGH (ref 65–99)
Potassium: 3.7 mmol/L (ref 3.5–5.1)
Sodium: 139 mmol/L (ref 135–145)
TOTAL PROTEIN: 7.2 g/dL (ref 6.5–8.1)

## 2016-12-26 LAB — CBC WITH DIFFERENTIAL/PLATELET
BASOS PCT: 1 %
Basophils Absolute: 0.1 10*3/uL (ref 0–0.1)
Eosinophils Absolute: 0.3 10*3/uL (ref 0–0.7)
Eosinophils Relative: 4 %
HEMATOCRIT: 31.7 % — AB (ref 35.0–47.0)
HEMOGLOBIN: 10.5 g/dL — AB (ref 12.0–16.0)
LYMPHS ABS: 1.4 10*3/uL (ref 1.0–3.6)
Lymphocytes Relative: 21 %
MCH: 29 pg (ref 26.0–34.0)
MCHC: 33 g/dL (ref 32.0–36.0)
MCV: 88 fL (ref 80.0–100.0)
MONOS PCT: 6 %
Monocytes Absolute: 0.4 10*3/uL (ref 0.2–0.9)
NEUTROS ABS: 4.4 10*3/uL (ref 1.4–6.5)
Neutrophils Relative %: 68 %
Platelets: 205 10*3/uL (ref 150–440)
RBC: 3.6 MIL/uL — ABNORMAL LOW (ref 3.80–5.20)
RDW: 20.3 % — ABNORMAL HIGH (ref 11.5–14.5)
WBC: 6.4 10*3/uL (ref 3.6–11.0)

## 2016-12-26 MED ORDER — HEPARIN SOD (PORK) LOCK FLUSH 100 UNIT/ML IV SOLN
500.0000 [IU] | Freq: Once | INTRAVENOUS | Status: AC | PRN
  Administered 2016-12-26: 500 [IU]
  Filled 2016-12-26 (×2): qty 5

## 2016-12-26 MED ORDER — ACETAMINOPHEN 325 MG PO TABS
650.0000 mg | ORAL_TABLET | Freq: Once | ORAL | Status: AC
  Administered 2016-12-26: 650 mg via ORAL
  Filled 2016-12-26: qty 2

## 2016-12-26 MED ORDER — PACLITAXEL CHEMO INJECTION 300 MG/50ML
80.0000 mg/m2 | Freq: Once | INTRAVENOUS | Status: AC
  Administered 2016-12-26: 138 mg via INTRAVENOUS
  Filled 2016-12-26: qty 23

## 2016-12-26 MED ORDER — TRASTUZUMAB CHEMO 150 MG IV SOLR
2.0000 mg/kg | Freq: Once | INTRAVENOUS | Status: AC
  Administered 2016-12-26: 126 mg via INTRAVENOUS
  Filled 2016-12-26: qty 6

## 2016-12-26 MED ORDER — SODIUM CHLORIDE 0.9 % IV SOLN
Freq: Once | INTRAVENOUS | Status: AC
  Administered 2016-12-26: 10:00:00 via INTRAVENOUS
  Filled 2016-12-26: qty 1000

## 2016-12-26 MED ORDER — FAMOTIDINE IN NACL 20-0.9 MG/50ML-% IV SOLN
20.0000 mg | Freq: Once | INTRAVENOUS | Status: AC
Start: 2016-12-26 — End: 2016-12-26
  Administered 2016-12-26: 20 mg via INTRAVENOUS
  Filled 2016-12-26: qty 50

## 2016-12-26 MED ORDER — DIPHENHYDRAMINE HCL 50 MG/ML IJ SOLN
50.0000 mg | Freq: Once | INTRAMUSCULAR | Status: AC
  Administered 2016-12-26: 50 mg via INTRAVENOUS
  Filled 2016-12-26: qty 1

## 2016-12-26 MED ORDER — SODIUM CHLORIDE 0.9 % IV SOLN
20.0000 mg | Freq: Once | INTRAVENOUS | Status: AC
  Administered 2016-12-26: 20 mg via INTRAVENOUS
  Filled 2016-12-26: qty 2

## 2016-12-26 NOTE — Progress Notes (Signed)
Patient here today for follow up.  Patient has no new concerns today  

## 2016-12-26 NOTE — Assessment & Plan Note (Addendum)
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 On taxol- Herceptin cycle weekly. JAN 5th 2017- CT scan - significant improvement.    # # Reviewed/counselled regarding the goals of care- being palliative/treatment are usually indefinite-until progression or side effects. Goal is to maintain quality of life as the disease is incurable.  # proceed with cycle #4  day 8 today. MUGA scan- 69% [sep 27th]; tolerated treatment well.will reapt MUGA scan.   # Right-sided pleural effusion status post thoracentesis. Likely malignant; although cytology negative. Resolved.   # Bone metastases on Xgeva;    # weekly/ chemo/labs; follow up in 3 weeks/MD/chemo.    # I reviewed the blood work- with the patient in detail; also reviewed the imaging independently [as summarized above]; and with the patient/ husband and 2 daughters in detail.

## 2016-12-26 NOTE — Progress Notes (Signed)
[place] OFFICE PROGRESS NOTE  Patient Care Team: Kathrine Haddock, NP as PCP - General (Nurse Practitioner) Robert Bellow, MD (General Surgery) Forest Gleason, MD (Oncology)  Breast cancer [facility])   Staging form: Breast, AJCC 7th Edition     Clinical stage from 04/06/2015: Stage IV (T2, N2, M1) - Signed by Evlyn Kanner, NP on 04/06/2015    Oncology History   1.history of carcinoma of right breast status post lumpectomy and radiation therapy (upper and outer quadrant) February 21, 2009.  Estrogen receptor 90%.  Progesterone receptor 60%.  Patient had lumpectomy and MammoSite partial breast radiation therapy followed by tamoxifen for 5 years.  Patient is still taking tamoxifen.. 2 recent mammogram in December of 2015 negative.  Further evaluation by surgeon revealed a palpable mass in the left upper outer quadrant near axilla.  Ultrasound revealed one small normal-appearing lymph node 0.6 cm in lower limit of axilla.  Adjacent to this there was ill-defined multilobulated hypoechoic mass 1.8 x 2.6 cm.  Biopsy of the left axillary mass was done which was positive for high-grade carcinoma.   2,estrogen receptor positive.  Progesterone receptor positive.  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 3.  MRI scan of breast (January, 2015) reveals multiple abnormal enlarged left axillary lymph node There are 2 oval some circumscribed mass at 2:30 and 3:00 position of the left breast.  0.6 cm x 1.3 cm and 4 x 7 mm Non-mass enhancement extending 2.4 cm these masses is 3.6 cm mass.  Findings suggestive of several pulmonary nodules.  PET  scan shows extensive disease, 4.started on Taxotere,perjeta, Herceptin(January 03, 2015- June 2016.   # June 2016- Herceptin [Letrozole + Ibrance] until May 2017.   # MAY 2017- PET PROGRESSION; # July 2017- START KADCYLA  x4 cycles- SEP 15th 2017CT- PROGRESSION  # SEP 2017- START Taxol + Herceptin.   # Right sided Pleural effusion [s/p Thora Sep  2017]  # Bone mets- X-geva q 6W       Breast cancer ([place])   12/08/2013 Initial Diagnosis    Breast cancer       Malignant neoplasm of axillary tail of female breast ([place])   12/16/2014 Initial Diagnosis    Malignant neoplasm of axilla       Primary malignant neoplasm of upper outer quadrant of left breast ([place])   07/27/2016 Initial Diagnosis    Primary malignant neoplasm of upper outer quadrant of left breast ([place])       Carcinoma of upper-outer quadrant of left breast in female, estrogen receptor positive ([place])   09/28/2016 Initial Diagnosis    Carcinoma of upper-outer quadrant of left breast in female, estrogen receptor positive ([place])       INTERVAL HISTORY:  Tammy Pope 71 y.o.  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    Denies any worsening shortness of breath. Denies any bone pain. No fevers or chills. Her appetite is good. Denies any new onset of skin rash. Denies any tingling or numbness. States her left breast mass has resolved.  REVIEW OF SYSTEMS:  A complete 10 point review of system is done which is negative except mentioned above/history of present illness.   PAST MEDICAL HISTORY :  Past Medical History:  Diagnosis Date  . Axillary mass 12/15/2014  . Breast cancer ([place]) 02/21/2009  .  Cancer [facility]) 2010   right breast ductal carcinoma in situ  . Diabetes mellitus without complication ([place])   . Floaters 2014  . Hyperlipidemia 1990  . Hypertension 2009  . Malignant neoplasm of axillary tail of female breast ([place]) 12/16/2014  . Malignant neoplasm of upper-outer quadrant of female breast ([place]) 02/21/2009   DCIS of the right breast, intermediate grade, resected the negative margins. ER 90%, PR 50%, MammoSite partial breast radiation  . 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 Special screening for malignant neoplasms, colon     PAST SURGICAL HISTORY :   Past Surgical History:  Procedure Laterality Date  . APPENDECTOMY  1956  . BREAST BIOPSY Right 2010   stereo  . BREAST SURGERY Right 2010   wide excision  . COLONOSCOPY  2008   ??? Md  . left axilla biopsy Left 12-14-14  . PARTIAL HYSTERECTOMY  1977    FAMILY HISTORY :   Family History  Problem Relation Age of Onset  . Cancer Other     unknown family members with ovarian,colon,breast cancers  . Hypertension Mother   . Heart disease Daughter     MI    SOCIAL HISTORY:   Social History  Substance Use Topics  . Smoking status: Never Smoker  . Smokeless tobacco: Never Used  . Alcohol use No    ALLERGIES:  is allergic to penicillin g and sulfa antibiotics.  MEDICATIONS:  Current Outpatient Prescriptions  Medication Sig Dispense Refill  . ALPRAZolam (XANAX) 0.5 MG tablet Take 1 tablet (0.5 mg total) by mouth 3 (three) times daily as needed. 90 tablet 2  . amLODipine (NORVASC) 5 MG tablet TAKE 1 TABLET (5 MG TOTAL) BY MOUTH DAILY. 30 tablet 3  . aspirin 81 MG tablet Take 81 mg by mouth daily.    . Calcium 600-200 MG-UNIT tablet Take 1 tablet by mouth 2 (two) times daily. 120 tablet 3  . metFORMIN (GLUCOPHAGE) 500 MG tablet Take 2 tablets (1,000 mg total) by mouth 2 (two) times daily with a meal. 360 tablet 5  . Multiple Vitamins-Minerals (MULTIVITAMIN ADULT PO) Take by mouth.    . ONE TOUCH ULTRA TEST test strip     . potassium chloride SA (KLOR-CON M20) 20 MEQ tablet Take 2 tablets (40 mEq total) by mouth daily. 60 tablet 3  . rosuvastatin (CRESTOR) 5 MG tablet TAKE 1 TABLET (5 MG TOTAL) BY MOUTH DAILY. 90 tablet 1  . valsartan-hydrochlorothiazide (DIOVAN-HCT) 320-25 MG tablet TAKE 1 TABLET BY MOUTH DAILY 30 tablet 6   No current facility-administered medications for this visit.    Facility-Administered Medications Ordered in Other Visits  Medication Dose  Route Frequency Provider Last Rate Last Dose  . heparin lock flush 100 unit/mL  250 Units Intracatheter PRN Forest Gleason, MD      . sodium chloride 0.9 % 250 mL with potassium chloride 40 mEq infusion   Intravenous Continuous Forest Gleason, MD   Stopped at 05/03/15 1229  . sodium chloride 0.9 % injection 10 mL  10 mL Intracatheter PRN Forest Gleason, MD   10 mL at 04/18/15 1545    PHYSICAL EXAMINATION: ECOG PERFORMANCE STATUS: 0 - Asymptomatic  BP (!) 170/84 (BP Location: Left Arm, Patient Position: Sitting)   Pulse (!) 105   Temp 97.4 F (36.3 C) (Tympanic)   Wt 132 lb 6 oz (60 kg)   LMP  (LMP Unknown)   BMI 23.45 kg/m   [employer]  12/26/16 0931  Weight: 132 lb 6 oz (60 kg)    GENERAL: Well-nourished well-developed; Alert, no distress and comfortable.   Accompanied by her husband. EYES: no pallor or icterus OROPHARYNX: no thrush or ulceration; poor dentition  NECK: supple, no masses felt LYMPH:  no palpable lymphadenopathy in the cervical, axillary or inguinal regions LUNGS: clear to auscultation and  No wheeze or crackles HEART/CVS: regular rate & rhythm and no murmurs; No lower extremity edema ABDOMEN:abdomen soft, non-tender and normal bowel sounds Musculoskeletal:no cyanosis of digits and no clubbing  PSYCH: alert & oriented x 3 with fluent speech NEURO: no focal motor/sensory deficits SKIN:  no rashes or significant lesions LEFT BREAST- 1-2cm mass left upper outer quadrant/axillary tail. Nontender. Skin: macular rash noted on the bilateral extremities  LABORATORY DATA:  I have reviewed the data as listed    Component Value Date/Time   NA 139 12/26/2016 0856   NA 136 09/16/2015 0843   NA 138 03/28/2015 0923   K 3.7 12/26/2016 0856   K 3.3 (L) 03/28/2015 0923   CL 108 12/26/2016 0856   CL 101 03/28/2015 0923   CO2 24 12/26/2016 0856   CO2 28 03/28/2015 0923   GLUCOSE 251 (H) 12/26/2016 0856   GLUCOSE 173 (H) 03/28/2015 0923   BUN 9 12/26/2016 0856   BUN 10  09/16/2015 0843   BUN 9 03/28/2015 0923   CREATININE 0.55 12/26/2016 0856   CREATININE 0.56 03/28/2015 0923   CALCIUM 8.8 (L) 12/26/2016 0856   CALCIUM 8.6 (L) 03/28/2015 0923   PROT 7.2 12/26/2016 0856   PROT 7.2 09/16/2015 0843   PROT 7.1 03/28/2015 0923   ALBUMIN 3.8 12/26/2016 0856   ALBUMIN 4.6 09/16/2015 0843   ALBUMIN 3.6 03/28/2015 0923   AST 48 (H) 12/26/2016 0856   AST 43 (H) 03/28/2015 0923   ALT 37 12/26/2016 0856   ALT 26 03/28/2015 0923   ALKPHOS 133 (H) 12/26/2016 0856   ALKPHOS 85 03/28/2015 0923   BILITOT 0.8 12/26/2016 0856   BILITOT 0.9 09/16/2015 0843   BILITOT 0.6 03/28/2015 0923   GFRNONAA >60 12/26/2016 0856   GFRNONAA >60 03/28/2015 0923   GFRAA >60 12/26/2016 0856   GFRAA >60 03/28/2015 0923    No results found for: SPEP, UPEP  Lab Results  Component Value Date   WBC 6.4 12/26/2016   NEUTROABS 4.4 12/26/2016   HGB 10.5 (L) 12/26/2016   HCT 31.7 (L) 12/26/2016   MCV 88.0 12/26/2016   PLT 205 12/26/2016      Chemistry      Component Value Date/Time   NA 139 12/26/2016 0856   NA 136 09/16/2015 0843   NA 138 03/28/2015 0923   K 3.7 12/26/2016 0856   K 3.3 (L) 03/28/2015 0923   CL 108 12/26/2016 0856   CL 101 03/28/2015 0923   CO2 24 12/26/2016 0856   CO2 28 03/28/2015 0923   BUN 9 12/26/2016 0856   BUN 10 09/16/2015 0843   BUN 9 03/28/2015 0923   CREATININE 0.55 12/26/2016 0856   CREATININE 0.56 03/28/2015 0923      Component Value Date/Time   CALCIUM 8.8 (L) 12/26/2016 0856   CALCIUM 8.6 (L) 03/28/2015 0923   ALKPHOS 133 (H) 12/26/2016 0856   ALKPHOS 85 03/28/2015 0923   AST 48 (H) 12/26/2016 0856   AST 43 (H) 03/28/2015 0923   ALT 37 12/26/2016 0856   ALT 26 03/28/2015 0923   BILITOT 0.8 12/26/2016 0856   BILITOT 0.9 09/16/2015  0843   BILITOT 0.6 03/28/2015 0923     IMPRESSION: 1. Overall, interval response to therapy. 2. Resolution of right pleural effusion with significant interval appearance of pleural disease  involving the right lung. No measurable pleural tumor identified at this time 3. Significant interval improvement in the appearance of left lateral breast lesion and left ventral chest wall lesion. 4. Morphologic features of the liver compatible with cirrhosis and mild portal venous hypertension with small esophageal varices. 5. Stable appearance of diffuse sclerotic bone metastases. 6. Peripheral and basilar predominant interstitial reticulation is noted affecting both lungs, suggestive of chronic interstitial lung disease.   Electronically Signed   By: Kerby Moors M.D.   On: 12/21/2016 12:03RADIOGRAPHIC STUDIES: I have personally reviewed the radiological images as listed and agreed with the findings in the report. No results found.   ASSESSMENT & PLAN:  Carcinoma of upper-outer quadrant of left breast in female, estrogen receptor positive ([place]) Metastatic breast cancer to the bone/lung. On taxol- Herceptin cycle weekly. JAN 5th 2017- CT scan - significant improvement.    # # Reviewed/counselled regarding the goals of care- being palliative/treatment are usually indefinite-until progression or side effects. Goal is to maintain quality of life as the disease is incurable.  # proceed with cycle #4  day 8 today. MUGA scan- 69% [sep 27th]; tolerated treatment well.will reapt MUGA scan.   # Right-sided pleural effusion status post thoracentesis. Likely malignant; although cytology negative. Resolved.   # Bone metastases on Xgeva;    # weekly/ chemo/labs; follow up in 3 weeks/MD/chemo.    # I reviewed the blood work- with the patient in detail; also reviewed the imaging independently [as summarized above]; and with the patient/ husband and 2 daughters in detail.   Orders Placed This Encounter  Procedures  . CBC with Differential/Platelet    Standing Status:   Standing    Number of Occurrences:   6    Standing Expiration Date:   06/25/2017  . Comprehensive metabolic panel     Standing Status:   Standing    Number of Occurrences:   6    Standing Expiration Date:   06/25/2017       Cammie Sickle, MD 12/27/2016 4:30 PM

## 2016-12-27 DIAGNOSIS — Z7189 Other specified counseling: Secondary | ICD-10-CM | POA: Insufficient documentation

## 2016-12-28 ENCOUNTER — Ambulatory Visit
Admission: RE | Admit: 2016-12-28 | Discharge: 2016-12-28 | Disposition: A | Discharge status: Home / Self Care | Payer: Medicare Other | Source: Ambulatory Visit | Attending: Internal Medicine | Admitting: Internal Medicine

## 2016-12-28 DIAGNOSIS — C50412 Malignant neoplasm of upper-outer quadrant of left female breast: Secondary | ICD-10-CM | POA: Diagnosis not present

## 2016-12-28 DIAGNOSIS — Z5181 Encounter for therapeutic drug level monitoring: Secondary | ICD-10-CM | POA: Diagnosis not present

## 2016-12-28 DIAGNOSIS — Z170 Estrogen receptor positive status [ER+]: Secondary | ICD-10-CM | POA: Diagnosis not present

## 2016-12-28 DIAGNOSIS — C50919 Malignant neoplasm of unspecified site of unspecified female breast: Secondary | ICD-10-CM | POA: Diagnosis not present

## 2016-12-28 DIAGNOSIS — Z79899 Other long term (current) drug therapy: Secondary | ICD-10-CM | POA: Insufficient documentation

## 2016-12-28 MED ORDER — TECHNETIUM TC 99M-LABELED RED BLOOD CELLS IV KIT
20.0000 | PACK | Freq: Once | INTRAVENOUS | Status: AC | PRN
  Administered 2016-12-28: 21.73 via INTRAVENOUS

## 2016-12-31 ENCOUNTER — Encounter: Payer: Self-pay | Admitting: Unknown Physician Specialty

## 2016-12-31 ENCOUNTER — Ambulatory Visit (INDEPENDENT_AMBULATORY_CARE_PROVIDER_SITE_OTHER): Payer: Medicare Other | Admitting: Unknown Physician Specialty

## 2016-12-31 VITALS — BP 118/70 | HR 114 | Temp 97.6°F | Wt 133.2 lb

## 2016-12-31 DIAGNOSIS — I10 Essential (primary) hypertension: Secondary | ICD-10-CM

## 2016-12-31 DIAGNOSIS — E782 Mixed hyperlipidemia: Secondary | ICD-10-CM | POA: Diagnosis not present

## 2016-12-31 DIAGNOSIS — E1122 Type 2 diabetes mellitus with diabetic chronic kidney disease: Secondary | ICD-10-CM

## 2016-12-31 LAB — BAYER DCA HB A1C WAIVED: HB A1C (BAYER DCA - WAIVED): 7.2 % — ABNORMAL HIGH (ref <7.0)

## 2016-12-31 MED ORDER — AMLODIPINE BESYLATE 5 MG PO TABS: 5.0000 mg | ORAL_TABLET | Freq: Every day | ORAL | 3 refills | Status: AC

## 2016-12-31 MED ORDER — ROSUVASTATIN CALCIUM 5 MG PO TABS: 5.0000 mg | ORAL_TABLET | Freq: Every day | ORAL | 1 refills | Status: AC

## 2016-12-31 MED ORDER — VALSARTAN-HYDROCHLOROTHIAZIDE 320-25 MG PO TABS: 1.0000 | ORAL_TABLET | Freq: Every day | ORAL | 3 refills | Status: AC

## 2016-12-31 NOTE — Assessment & Plan Note (Addendum)
Lower BP is standing.  She is a little tachy.  It is lower than standing.

## 2016-12-31 NOTE — Assessment & Plan Note (Addendum)
Hgb A1C is 7.2.  Will continue with prsent treatment.

## 2016-12-31 NOTE — Progress Notes (Signed)
BP 118/70   Pulse (!) 114   Temp 97.6 F (36.4 C)   Wt 133 lb 3.2 oz (60.4 kg)   LMP  (LMP Unknown)   SpO2 95%   BMI 23.60 kg/m    Subjective:    Patient ID: Tammy Pope, female    DOB: Sep 24, 1946, 71 y.o.   MRN: [id number]  HPI: Tammy Pope is a 71 y.o. female  Chief Complaint  Patient presents with  . Diabetes    pt states it has been a few years since her last eye exam   . Hyperlipidemia  . Hypertension   Diabetes: Using medications without difficulties No hypoglycemic episodes No hyperglycemic episodes Feet problems:none Blood Sugars averaging:120-140 eye exam within last year Last Hgb A1C: 8.0%  Hypertension  Using medications without difficulty Average home BPs   Using medication without SBP around 140 or lightheadedness No chest pain with exertion or shortness of breath No Edema  Elevated Cholesterol Using medications without problems No Muscle aches  Diet/Exercise:  Breast Cancer Undergoing treatments at the cancer center.  Pt states her understanding of the disease process is that her breast cancer is gone.  Not interested in palliative care options at this time.   Relevant past medical, surgical, family and social history reviewed and updated as indicated. Interim medical history since our last visit reviewed. Allergies and medications reviewed and updated.  Review of Systems  Per HPI unless specifically indicated above     Objective:    BP 118/70   Pulse (!) 114   Temp 97.6 F (36.4 C)   Wt 133 lb 3.2 oz (60.4 kg)   LMP  (LMP Unknown)   SpO2 95%   BMI 23.60 kg/m   Wt Readings from Last 3 Encounters:  12/31/16 133 lb 3.2 oz (60.4 kg)  12/26/16 132 lb 6 oz (60 kg)  12/05/16 131 lb 8 oz (59.6 kg)    Physical Exam  Constitutional: She is oriented to person, place, and time. She appears well-developed and well-nourished. No distress.  HENT:  Head: Normocephalic and atraumatic.  Eyes: Conjunctivae and lids are normal. Right eye  exhibits no discharge. Left eye exhibits no discharge. No scleral icterus.  Neck: Normal range of motion. Neck supple. No JVD present. Carotid bruit is not present.  Cardiovascular: Normal rate, regular rhythm and normal heart sounds.   Pulmonary/Chest: Effort normal and breath sounds normal.  Abdominal: Normal appearance. There is no splenomegaly or hepatomegaly.  Musculoskeletal: Normal range of motion.  Neurological: She is alert and oriented to person, place, and time.  Skin: Skin is warm, dry and intact. No rash noted. No pallor.  Psychiatric: She has a normal mood and affect. Her behavior is normal. Judgment and thought content normal.    Results for orders placed or performed in visit on 12/26/16  CBC with Differential/Platelet  Result Value Ref Range   WBC 6.4 3.6 - 11.0 K/uL   RBC 3.60 (L) 3.80 - 5.20 MIL/uL   Hemoglobin 10.5 (L) 12.0 - 16.0 g/dL   HCT 31.7 (L) 35.0 - 47.0 %   MCV 88.0 80.0 - 100.0 fL   MCH 29.0 26.0 - 34.0 pg   MCHC 33.0 32.0 - 36.0 g/dL   RDW 20.3 (H) 11.5 - 14.5 %   Platelets 205 150 - 440 K/uL   Neutrophils Relative % 68 %   Neutro Abs 4.4 1.4 - 6.5 K/uL   Lymphocytes Relative 21 %   Lymphs Abs 1.4 1.0 -  3.6 K/uL   Monocytes Relative 6 %   Monocytes Absolute 0.4 0.2 - 0.9 K/uL   Eosinophils Relative 4 %   Eosinophils Absolute 0.3 0 - 0.7 K/uL   Basophils Relative 1 %   Basophils Absolute 0.1 0 - 0.1 K/uL  Comprehensive metabolic panel  Result Value Ref Range   Sodium 139 135 - 145 mmol/L   Potassium 3.7 3.5 - 5.1 mmol/L   Chloride 108 101 - 111 mmol/L   CO2 24 22 - 32 mmol/L   Glucose, Bld 251 (H) 65 - 99 mg/dL   BUN 9 6 - 20 mg/dL   Creatinine, Ser 0.55 0.44 - 1.00 mg/dL   Calcium 8.8 (L) 8.9 - 10.3 mg/dL   Total Protein 7.2 6.5 - 8.1 g/dL   Albumin 3.8 3.5 - 5.0 g/dL   AST 48 (H) 15 - 41 U/L   ALT 37 14 - 54 U/L   Alkaline Phosphatase 133 (H) 38 - 126 U/L   Total Bilirubin 0.8 0.3 - 1.2 mg/dL   GFR calc non Af Amer >60 >60 mL/min   GFR  calc Af Amer >60 >60 mL/min   Anion gap 7 5 - 15      Assessment & Plan:   Problem List Items Addressed This Visit      Unprioritized   Hyperlipidemia   Relevant Medications   rosuvastatin (CRESTOR) 5 MG tablet   amLODipine (NORVASC) 5 MG tablet   valsartan-hydrochlorothiazide (DIOVAN-HCT) 320-25 MG tablet   Other Relevant Orders   Lipid Panel w/o Chol/HDL Ratio   Hypertension    Lower BP is standing.  She is a little tachy.  It is lower than standing.        Relevant Medications   rosuvastatin (CRESTOR) 5 MG tablet   amLODipine (NORVASC) 5 MG tablet   valsartan-hydrochlorothiazide (DIOVAN-HCT) 320-25 MG tablet   Type 2 diabetes mellitus (HCC) - Primary    Hgb A1C is 7.2.  Will continue with prsent treatment.         Relevant Medications   rosuvastatin (CRESTOR) 5 MG tablet   valsartan-hydrochlorothiazide (DIOVAN-HCT) 320-25 MG tablet   Other Relevant Orders   Bayer DCA Hb A1c Waived       Follow up plan: Return in about 4 weeks (around 01/28/2017).  For pulse and bp

## 2017-01-01 LAB — LIPID PANEL W/O CHOL/HDL RATIO
CHOLESTEROL TOTAL: 217 mg/dL — AB (ref 100–199)
HDL: 36 mg/dL — AB (ref >39)
LDL CALC: 129 mg/dL — AB (ref 0–99)
TRIGLYCERIDES: 259 mg/dL — AB (ref 0–149)
VLDL Cholesterol Cal: 52 mg/dL — ABNORMAL HIGH (ref 5–40)

## 2017-01-02 ENCOUNTER — Inpatient Hospital Stay: Payer: Medicare Other

## 2017-01-09 ENCOUNTER — Inpatient Hospital Stay: Payer: Medicare Other

## 2017-01-09 VITALS — BP 145/81 | HR 86 | Temp 97.2°F | Resp 18

## 2017-01-09 DIAGNOSIS — Z5112 Encounter for antineoplastic immunotherapy: Secondary | ICD-10-CM | POA: Diagnosis not present

## 2017-01-09 DIAGNOSIS — C50412 Malignant neoplasm of upper-outer quadrant of left female breast: Secondary | ICD-10-CM

## 2017-01-09 DIAGNOSIS — Z5111 Encounter for antineoplastic chemotherapy: Secondary | ICD-10-CM | POA: Diagnosis not present

## 2017-01-09 DIAGNOSIS — C50919 Malignant neoplasm of unspecified site of unspecified female breast: Secondary | ICD-10-CM

## 2017-01-09 DIAGNOSIS — J90 Pleural effusion, not elsewhere classified: Secondary | ICD-10-CM | POA: Diagnosis not present

## 2017-01-09 DIAGNOSIS — Z170 Estrogen receptor positive status [ER+]: Secondary | ICD-10-CM | POA: Diagnosis not present

## 2017-01-09 DIAGNOSIS — C50612 Malignant neoplasm of axillary tail of left female breast: Secondary | ICD-10-CM

## 2017-01-09 DIAGNOSIS — C7951 Secondary malignant neoplasm of bone: Secondary | ICD-10-CM | POA: Diagnosis not present

## 2017-01-09 DIAGNOSIS — C50611 Malignant neoplasm of axillary tail of right female breast: Secondary | ICD-10-CM

## 2017-01-09 LAB — COMPREHENSIVE METABOLIC PANEL
ALT: 26 U/L (ref 14–54)
ANION GAP: 8 (ref 5–15)
AST: 36 U/L (ref 15–41)
Albumin: 4.1 g/dL (ref 3.5–5.0)
Alkaline Phosphatase: 145 U/L — ABNORMAL HIGH (ref 38–126)
BUN: 9 mg/dL (ref 6–20)
CALCIUM: 9.2 mg/dL (ref 8.9–10.3)
CHLORIDE: 105 mmol/L (ref 101–111)
CO2: 26 mmol/L (ref 22–32)
Creatinine, Ser: 0.69 mg/dL (ref 0.44–1.00)
GFR calc non Af Amer: >60 mL/min (ref >60)
Glucose, Bld: 179 mg/dL — ABNORMAL HIGH (ref 65–99)
Potassium: 3.7 mmol/L (ref 3.5–5.1)
SODIUM: 139 mmol/L (ref 135–145)
Total Bilirubin: 0.7 mg/dL (ref 0.3–1.2)
Total Protein: 7.7 g/dL (ref 6.5–8.1)

## 2017-01-09 LAB — CBC WITH DIFFERENTIAL/PLATELET
Basophils Absolute: 0.1 10*3/uL (ref 0–0.1)
Basophils Relative: 1 %
EOS ABS: 0.2 10*3/uL (ref 0–0.7)
EOS PCT: 3 %
HCT: 34.6 % — ABNORMAL LOW (ref 35.0–47.0)
Hemoglobin: 11.3 g/dL — ABNORMAL LOW (ref 12.0–16.0)
LYMPHS ABS: 1.4 10*3/uL (ref 1.0–3.6)
Lymphocytes Relative: 27 %
MCH: 28.4 pg (ref 26.0–34.0)
MCHC: 32.8 g/dL (ref 32.0–36.0)
MCV: 86.4 fL (ref 80.0–100.0)
MONOS PCT: 12 %
Monocytes Absolute: 0.6 10*3/uL (ref 0.2–0.9)
Neutro Abs: 2.9 10*3/uL (ref 1.4–6.5)
Neutrophils Relative %: 57 %
PLATELETS: 212 10*3/uL (ref 150–440)
RBC: 4 MIL/uL (ref 3.80–5.20)
RDW: 20 % — ABNORMAL HIGH (ref 11.5–14.5)
WBC: 5.2 10*3/uL (ref 3.6–11.0)

## 2017-01-09 MED ORDER — SODIUM CHLORIDE 0.9% FLUSH
10.0000 mL | INTRAVENOUS | Status: DC | PRN
  Administered 2017-01-09: 10 mL
  Filled 2017-01-09: qty 10

## 2017-01-09 MED ORDER — SODIUM CHLORIDE 0.9 % IV SOLN
2.0000 mg/kg | Freq: Once | INTRAVENOUS | Status: AC
  Administered 2017-01-09: 126 mg via INTRAVENOUS
  Filled 2017-01-09: qty 6

## 2017-01-09 MED ORDER — FAMOTIDINE IN NACL 20-0.9 MG/50ML-% IV SOLN
20.0000 mg | Freq: Once | INTRAVENOUS | Status: AC
  Administered 2017-01-09: 20 mg via INTRAVENOUS
  Filled 2017-01-09: qty 50

## 2017-01-09 MED ORDER — DENOSUMAB 120 MG/1.7ML ~~LOC~~ SOLN
120.0000 mg | Freq: Once | SUBCUTANEOUS | Status: AC
  Administered 2017-01-09: 120 mg via SUBCUTANEOUS
  Filled 2017-01-09: qty 1.7

## 2017-01-09 MED ORDER — ACETAMINOPHEN 325 MG PO TABS
650.0000 mg | ORAL_TABLET | Freq: Once | ORAL | Status: AC
  Administered 2017-01-09: 650 mg via ORAL
  Filled 2017-01-09: qty 2

## 2017-01-09 MED ORDER — SODIUM CHLORIDE 0.9 % IV SOLN
Freq: Once | INTRAVENOUS | Status: AC
  Administered 2017-01-09: 10:00:00 via INTRAVENOUS
  Filled 2017-01-09: qty 1000

## 2017-01-09 MED ORDER — HEPARIN SOD (PORK) LOCK FLUSH 100 UNIT/ML IV SOLN
500.0000 [IU] | Freq: Once | INTRAVENOUS | Status: AC | PRN
  Administered 2017-01-09: 500 [IU]
  Filled 2017-01-09: qty 5

## 2017-01-09 MED ORDER — SODIUM CHLORIDE 0.9 % IV SOLN
20.0000 mg | Freq: Once | INTRAVENOUS | Status: AC
  Administered 2017-01-09: 20 mg via INTRAVENOUS
  Filled 2017-01-09: qty 2

## 2017-01-09 MED ORDER — DIPHENHYDRAMINE HCL 50 MG/ML IJ SOLN
50.0000 mg | Freq: Once | INTRAMUSCULAR | Status: AC
  Administered 2017-01-09: 50 mg via INTRAVENOUS
  Filled 2017-01-09: qty 1

## 2017-01-09 MED ORDER — PACLITAXEL CHEMO INJECTION 300 MG/50ML
80.0000 mg/m2 | Freq: Once | INTRAVENOUS | Status: AC
  Administered 2017-01-09: 138 mg via INTRAVENOUS
  Filled 2017-01-09: qty 23

## 2017-01-16 ENCOUNTER — Inpatient Hospital Stay (HOSPITAL_BASED_OUTPATIENT_CLINIC_OR_DEPARTMENT_OTHER): Payer: Medicare Other | Admitting: Internal Medicine

## 2017-01-16 ENCOUNTER — Inpatient Hospital Stay: Payer: Medicare Other

## 2017-01-16 DIAGNOSIS — E785 Hyperlipidemia, unspecified: Secondary | ICD-10-CM | POA: Diagnosis not present

## 2017-01-16 DIAGNOSIS — Z5111 Encounter for antineoplastic chemotherapy: Secondary | ICD-10-CM | POA: Diagnosis not present

## 2017-01-16 DIAGNOSIS — Z170 Estrogen receptor positive status [ER+]: Secondary | ICD-10-CM

## 2017-01-16 DIAGNOSIS — C50412 Malignant neoplasm of upper-outer quadrant of left female breast: Secondary | ICD-10-CM | POA: Diagnosis not present

## 2017-01-16 DIAGNOSIS — E119 Type 2 diabetes mellitus without complications: Secondary | ICD-10-CM

## 2017-01-16 DIAGNOSIS — C7951 Secondary malignant neoplasm of bone: Secondary | ICD-10-CM

## 2017-01-16 DIAGNOSIS — Z923 Personal history of irradiation: Secondary | ICD-10-CM

## 2017-01-16 DIAGNOSIS — J90 Pleural effusion, not elsewhere classified: Secondary | ICD-10-CM | POA: Diagnosis not present

## 2017-01-16 DIAGNOSIS — I10 Essential (primary) hypertension: Secondary | ICD-10-CM

## 2017-01-16 DIAGNOSIS — Z809 Family history of malignant neoplasm, unspecified: Secondary | ICD-10-CM

## 2017-01-16 DIAGNOSIS — Z5112 Encounter for antineoplastic immunotherapy: Secondary | ICD-10-CM | POA: Diagnosis not present

## 2017-01-16 DIAGNOSIS — Z7984 Long term (current) use of oral hypoglycemic drugs: Secondary | ICD-10-CM

## 2017-01-16 DIAGNOSIS — Z79899 Other long term (current) drug therapy: Secondary | ICD-10-CM

## 2017-01-16 DIAGNOSIS — Z7982 Long term (current) use of aspirin: Secondary | ICD-10-CM

## 2017-01-16 DIAGNOSIS — Z853 Personal history of malignant neoplasm of breast: Secondary | ICD-10-CM

## 2017-01-16 DIAGNOSIS — C50919 Malignant neoplasm of unspecified site of unspecified female breast: Secondary | ICD-10-CM

## 2017-01-16 LAB — CBC WITH DIFFERENTIAL/PLATELET
BASOS ABS: 0 10*3/uL (ref 0–0.1)
Basophils Relative: 1 %
EOS ABS: 0.2 10*3/uL (ref 0–0.7)
EOS PCT: 3 %
HCT: 33.5 % — ABNORMAL LOW (ref 35.0–47.0)
Hemoglobin: 11.1 g/dL — ABNORMAL LOW (ref 12.0–16.0)
LYMPHS PCT: 26 %
Lymphs Abs: 1.3 10*3/uL (ref 1.0–3.6)
MCH: 28 pg (ref 26.0–34.0)
MCHC: 33 g/dL (ref 32.0–36.0)
MCV: 84.9 fL (ref 80.0–100.0)
MONO ABS: 0.3 10*3/uL (ref 0.2–0.9)
Monocytes Relative: 6 %
Neutro Abs: 3.4 10*3/uL (ref 1.4–6.5)
Neutrophils Relative %: 64 %
PLATELETS: 206 10*3/uL (ref 150–440)
RBC: 3.95 MIL/uL (ref 3.80–5.20)
RDW: 19.8 % — AB (ref 11.5–14.5)
WBC: 5.2 10*3/uL (ref 3.6–11.0)

## 2017-01-16 LAB — COMPREHENSIVE METABOLIC PANEL
ALT: 32 U/L (ref 14–54)
AST: 49 U/L — AB (ref 15–41)
Albumin: 3.8 g/dL (ref 3.5–5.0)
Alkaline Phosphatase: 135 U/L — ABNORMAL HIGH (ref 38–126)
Anion gap: 8 (ref 5–15)
BUN: 9 mg/dL (ref 6–20)
CHLORIDE: 105 mmol/L (ref 101–111)
CO2: 27 mmol/L (ref 22–32)
Calcium: 9.4 mg/dL (ref 8.9–10.3)
Creatinine, Ser: 0.51 mg/dL (ref 0.44–1.00)
Glucose, Bld: 167 mg/dL — ABNORMAL HIGH (ref 65–99)
Potassium: 3.7 mmol/L (ref 3.5–5.1)
SODIUM: 140 mmol/L (ref 135–145)
Total Bilirubin: 0.6 mg/dL (ref 0.3–1.2)
Total Protein: 7.3 g/dL (ref 6.5–8.1)

## 2017-01-16 MED ORDER — PACLITAXEL CHEMO INJECTION 300 MG/50ML
80.0000 mg/m2 | Freq: Once | INTRAVENOUS | Status: AC
  Administered 2017-01-16: 138 mg via INTRAVENOUS
  Filled 2017-01-16: qty 23

## 2017-01-16 MED ORDER — SODIUM CHLORIDE 0.9 % IV SOLN
20.0000 mg | Freq: Once | INTRAVENOUS | Status: AC
  Administered 2017-01-16: 20 mg via INTRAVENOUS
  Filled 2017-01-16: qty 2

## 2017-01-16 MED ORDER — SODIUM CHLORIDE 0.9 % IV SOLN
Freq: Once | INTRAVENOUS | Status: AC
  Administered 2017-01-16: 10:00:00 via INTRAVENOUS
  Filled 2017-01-16: qty 1000

## 2017-01-16 MED ORDER — ACETAMINOPHEN 325 MG PO TABS
650.0000 mg | ORAL_TABLET | Freq: Once | ORAL | Status: AC
  Administered 2017-01-16: 650 mg via ORAL
  Filled 2017-01-16: qty 2

## 2017-01-16 MED ORDER — FAMOTIDINE IN NACL 20-0.9 MG/50ML-% IV SOLN
20.0000 mg | Freq: Once | INTRAVENOUS | Status: AC
  Administered 2017-01-16: 20 mg via INTRAVENOUS
  Filled 2017-01-16: qty 50

## 2017-01-16 MED ORDER — HEPARIN SOD (PORK) LOCK FLUSH 100 UNIT/ML IV SOLN
500.0000 [IU] | Freq: Once | INTRAVENOUS | Status: AC | PRN
  Administered 2017-01-16: 500 [IU]
  Filled 2017-01-16: qty 5

## 2017-01-16 MED ORDER — DIPHENHYDRAMINE HCL 50 MG/ML IJ SOLN
50.0000 mg | Freq: Once | INTRAMUSCULAR | Status: AC
  Administered 2017-01-16: 50 mg via INTRAVENOUS
  Filled 2017-01-16: qty 1

## 2017-01-16 MED ORDER — SODIUM CHLORIDE 0.9 % IV SOLN
2.0000 mg/kg | Freq: Once | INTRAVENOUS | Status: AC
  Administered 2017-01-16: 126 mg via INTRAVENOUS
  Filled 2017-01-16: qty 6

## 2017-01-16 NOTE — Progress Notes (Signed)
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  Estrogen receptor 90%.  Progesterone receptor 60%.  Patient had lumpectomy and MammoSite partial breast radiation therapy followed by tamoxifen for 5 years.  Patient is still taking tamoxifen.. 2 recent mammogram in December of 2015 negative.  Further evaluation by surgeon revealed a palpable mass in the left upper outer quadrant near axilla.  Ultrasound revealed one small normal-appearing lymph node 0.6 cm in lower limit of axilla.  Adjacent to this there was ill-defined multilobulated hypoechoic mass 1.8 x 2.6 cm.  Biopsy of the left axillary mass was done which was positive for high-grade carcinoma.   2,estrogen receptor positive.  Progesterone receptor positive.  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 3.  MRI scan of breast (January, 2015) reveals multiple abnormal enlarged left axillary lymph node There are 2 oval some circumscribed mass at 2:30 and 3:00 position of the left breast.  0.6 cm x 1.3 cm and 4 x 7 mm Non-mass enhancement extending 2.4 cm these masses is 3.6 cm mass.  Findings suggestive of several pulmonary nodules.  PET  scan shows extensive disease, 4.started on Taxotere,perjeta, Herceptin(January 03, 2015- June 2016.   # June 2016- Herceptin [Letrozole + Ibrance] until May 2017.   # MAY 2017- PET PROGRESSION; # July 2017- START KADCYLA  x4 cycles- SEP 15th 2017CT- PROGRESSION  # SEP 2017- START Taxol + Herceptin.   # Right sided Pleural effusion [s/p Thora Sep  2017]  # Bone mets- X-geva q 6W       Breast cancer ([place])   12/08/2013 Initial Diagnosis    Breast cancer       Malignant neoplasm of axillary tail of female breast ([place])   12/16/2014 Initial Diagnosis    Malignant neoplasm of axilla       Primary malignant neoplasm of upper outer quadrant of left breast ([place])   07/27/2016 Initial Diagnosis    Primary malignant neoplasm of upper outer quadrant of left breast ([place])       Carcinoma of upper-outer quadrant of left breast in female, estrogen receptor positive ([place])   09/28/2016 Initial Diagnosis    Carcinoma of upper-outer quadrant of left breast in female, estrogen receptor positive ([place])       INTERVAL HISTORY:  Tammy Pope 71 y.o.  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    Denies any worsening shortness of breath. Denies any bone pain. No fevers or chills. Her appetite is good. Denies any new onset of skin rash. Denies any tingling or numbness. Denies any headaches. Denies any falls.  REVIEW OF SYSTEMS:  A complete 10 point review of system is done which is negative except mentioned above/history of present illness.   PAST MEDICAL HISTORY :  Past Medical History:  Diagnosis Date  . Axillary mass 12/15/2014  . Breast cancer ([place]) 02/21/2009  . Cancer [facility]) 2010   right breast ductal carcinoma  in situ  . Diabetes mellitus without complication ([place])   . Floaters 2014  . Hyperlipidemia 1990  . Hypertension 2009  . Malignant neoplasm of axillary tail of female breast ([place]) 12/16/2014  . Malignant neoplasm of upper-outer quadrant of female breast ([place]) 02/21/2009   DCIS of the right breast, intermediate grade, resected the negative margins. ER 90%, PR 50%, MammoSite partial breast radiation  . 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 Special screening  for malignant neoplasms, colon     PAST SURGICAL HISTORY :   Past Surgical History:  Procedure Laterality Date  . APPENDECTOMY  1956  . BREAST BIOPSY Right 2010   stereo  . BREAST SURGERY Right 2010   wide excision  . COLONOSCOPY  2008   ??? Md  . left axilla biopsy Left 12-14-14  . PARTIAL HYSTERECTOMY  1977    FAMILY HISTORY :   Family History  Problem Relation Age of Onset  . Cancer Other     unknown family members with ovarian,colon,breast cancers  . Hypertension Mother   . Heart disease Daughter     MI    SOCIAL HISTORY:   Social History  Substance Use Topics  . Smoking status: Never Smoker  . Smokeless tobacco: Never Used  . Alcohol use No    ALLERGIES:  is allergic to penicillin g and sulfa antibiotics.  MEDICATIONS:  Current Outpatient Prescriptions  Medication Sig Dispense Refill  . ALPRAZolam (XANAX) 0.5 MG tablet Take 1 tablet (0.5 mg total) by mouth 3 (three) times daily as needed. 90 tablet 2  . amLODipine (NORVASC) 5 MG tablet Take 1 tablet (5 mg total) by mouth daily. 90 tablet 3  . aspirin 81 MG tablet Take 81 mg by mouth daily.    . Calcium 600-200 MG-UNIT tablet Take 1 tablet by mouth 2 (two) times daily. 120 tablet 3  . metFORMIN (GLUCOPHAGE) 500 MG tablet Take 2 tablets (1,000 mg total) by mouth 2 (two) times daily with a meal. 360 tablet 5  . Multiple Vitamins-Minerals (MULTIVITAMIN ADULT PO) Take by mouth.    . ONE TOUCH ULTRA TEST test strip     . potassium chloride SA (KLOR-CON M20) 20 MEQ tablet Take 2 tablets (40 mEq total) by mouth daily. 60 tablet 3  . rosuvastatin (CRESTOR) 5 MG tablet Take 1 tablet (5 mg total) by mouth at bedtime. 90 tablet 1  . valsartan-hydrochlorothiazide (DIOVAN-HCT) 320-25 MG tablet Take 1 tablet by mouth daily. 90 tablet 3   No current facility-administered medications for this visit.    Facility-Administered Medications Ordered in Other Visits  Medication Dose Route Frequency Provider Last Rate Last Dose  .  heparin lock flush 100 unit/mL  250 Units Intracatheter PRN Forest Gleason, MD      . sodium chloride 0.9 % 250 mL with potassium chloride 40 mEq infusion   Intravenous Continuous Forest Gleason, MD   Stopped at 05/03/15 1229  . sodium chloride 0.9 % injection 10 mL  10 mL Intracatheter PRN Forest Gleason, MD   10 mL at 04/18/15 1545    PHYSICAL EXAMINATION: ECOG PERFORMANCE STATUS: 0 - Asymptomatic  LMP  (LMP Unknown)   There were no vitals filed for this visit.  GENERAL: Well-nourished well-developed; Alert, no distress and comfortable.   Accompanied by her husband. EYES: no pallor or icterus OROPHARYNX: no thrush or ulceration; poor dentition  NECK: supple, no masses felt LYMPH:  no palpable lymphadenopathy in the cervical, axillary or  inguinal regions LUNGS: clear to auscultation and  No wheeze or crackles HEART/CVS: regular rate & rhythm and no murmurs; No lower extremity edema ABDOMEN:abdomen soft, non-tender and normal bowel sounds Musculoskeletal:no cyanosis of digits and no clubbing  PSYCH: alert & oriented x 3 with fluent speech NEURO: no focal motor/sensory deficits SKIN:  no rashes or significant lesions LEFT BREAST- 1-2cm mass left upper outer quadrant/axillary tail. Nontender. Skin: macular rash noted on the bilateral extremities  LABORATORY DATA:  I have reviewed the data as listed    Component Value Date/Time   NA 139 01/09/2017 0834   NA 136 09/16/2015 0843   NA 138 03/28/2015 0923   K 3.7 01/09/2017 0834   K 3.3 (L) 03/28/2015 0923   CL 105 01/09/2017 0834   CL 101 03/28/2015 0923   CO2 26 01/09/2017 0834   CO2 28 03/28/2015 0923   GLUCOSE 179 (H) 01/09/2017 0834   GLUCOSE 173 (H) 03/28/2015 0923   BUN 9 01/09/2017 0834   BUN 10 09/16/2015 0843   BUN 9 03/28/2015 0923   CREATININE 0.69 01/09/2017 0834   CREATININE 0.56 03/28/2015 0923   CALCIUM 9.2 01/09/2017 0834   CALCIUM 8.6 (L) 03/28/2015 0923   PROT 7.7 01/09/2017 0834   PROT 7.2 09/16/2015 0843    PROT 7.1 03/28/2015 0923   ALBUMIN 4.1 01/09/2017 0834   ALBUMIN 4.6 09/16/2015 0843   ALBUMIN 3.6 03/28/2015 0923   AST 36 01/09/2017 0834   AST 43 (H) 03/28/2015 0923   ALT 26 01/09/2017 0834   ALT 26 03/28/2015 0923   ALKPHOS 145 (H) 01/09/2017 0834   ALKPHOS 85 03/28/2015 0923   BILITOT 0.7 01/09/2017 0834   BILITOT 0.9 09/16/2015 0843   BILITOT 0.6 03/28/2015 0923   GFRNONAA >60 01/09/2017 0834   GFRNONAA >60 03/28/2015 0923   GFRAA >60 01/09/2017 0834   GFRAA >60 03/28/2015 0923    No results found for: SPEP, UPEP  Lab Results  Component Value Date   WBC 5.2 01/16/2017   NEUTROABS 3.4 01/16/2017   HGB 11.1 (L) 01/16/2017   HCT 33.5 (L) 01/16/2017   MCV 84.9 01/16/2017   PLT 206 01/16/2017      Chemistry      Component Value Date/Time   NA 139 01/09/2017 0834   NA 136 09/16/2015 0843   NA 138 03/28/2015 0923   K 3.7 01/09/2017 0834   K 3.3 (L) 03/28/2015 0923   CL 105 01/09/2017 0834   CL 101 03/28/2015 0923   CO2 26 01/09/2017 0834   CO2 28 03/28/2015 0923   BUN 9 01/09/2017 0834   BUN 10 09/16/2015 0843   BUN 9 03/28/2015 0923   CREATININE 0.69 01/09/2017 0834   CREATININE 0.56 03/28/2015 0923      Component Value Date/Time   CALCIUM 9.2 01/09/2017 0834   CALCIUM 8.6 (L) 03/28/2015 0923   ALKPHOS 145 (H) 01/09/2017 0834   ALKPHOS 85 03/28/2015 0923   AST 36 01/09/2017 0834   AST 43 (H) 03/28/2015 0923   ALT 26 01/09/2017 0834   ALT 26 03/28/2015 0923   BILITOT 0.7 01/09/2017 0834   BILITOT 0.9 09/16/2015 0843   BILITOT 0.6 03/28/2015 0923     IMPRESSION: 1. Overall, interval response to therapy. 2. Resolution of right pleural effusion with significant interval appearance of pleural disease involving the right lung. No measurable pleural tumor identified at this time 3. Significant interval improvement in the appearance of left lateral breast lesion and left ventral chest  wall lesion. 4. Morphologic features of the liver compatible with  cirrhosis and mild portal venous hypertension with small esophageal varices. 5. Stable appearance of diffuse sclerotic bone metastases. 6. Peripheral and basilar predominant interstitial reticulation is noted affecting both lungs, suggestive of chronic interstitial lung disease.   Electronically Signed   By: Kerby Moors M.D.   On: 12/21/2016 12:03RADIOGRAPHIC STUDIES: I have personally reviewed the radiological images as listed and agreed with the findings in the report. No results found.   ASSESSMENT & PLAN:  No problem-specific Assessment & Plan notes found for this encounter.  No orders of the defined types were placed in this encounter.      Cammie Sickle, MD 01/16/2017 9:04 AM

## 2017-01-16 NOTE — Assessment & Plan Note (Addendum)
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 On taxol- Herceptin cycle weekly. JAN 5th 2017- CT scan - significant improvement.    # proceed with cycle #5 day 1 today. Tolerating chemo well; jan 2018- MUGA scan- 63%.   # Bone metastases on Xgeva; next visit.  # weekly/ chemo/labs; follow up in 3 weeks/MD/chemo.

## 2017-01-16 NOTE — Progress Notes (Signed)
Patient here today for follow up.  Patient states no new concerns today  

## 2017-01-23 ENCOUNTER — Inpatient Hospital Stay: Payer: Medicare Other | Attending: Internal Medicine

## 2017-01-23 ENCOUNTER — Inpatient Hospital Stay: Payer: Medicare Other

## 2017-01-23 VITALS — BP 139/86 | HR 84 | Temp 98.1°F | Resp 18

## 2017-01-23 DIAGNOSIS — Z800 Family history of malignant neoplasm of digestive organs: Secondary | ICD-10-CM | POA: Insufficient documentation

## 2017-01-23 DIAGNOSIS — I87309 Chronic venous hypertension (idiopathic) without complications of unspecified lower extremity: Secondary | ICD-10-CM | POA: Insufficient documentation

## 2017-01-23 DIAGNOSIS — E785 Hyperlipidemia, unspecified: Secondary | ICD-10-CM | POA: Diagnosis not present

## 2017-01-23 DIAGNOSIS — C50919 Malignant neoplasm of unspecified site of unspecified female breast: Secondary | ICD-10-CM

## 2017-01-23 DIAGNOSIS — Z803 Family history of malignant neoplasm of breast: Secondary | ICD-10-CM | POA: Diagnosis not present

## 2017-01-23 DIAGNOSIS — I8500 Esophageal varices without bleeding: Secondary | ICD-10-CM | POA: Insufficient documentation

## 2017-01-23 DIAGNOSIS — Z8041 Family history of malignant neoplasm of ovary: Secondary | ICD-10-CM | POA: Insufficient documentation

## 2017-01-23 DIAGNOSIS — Z170 Estrogen receptor positive status [ER+]: Principal | ICD-10-CM

## 2017-01-23 DIAGNOSIS — Z5111 Encounter for antineoplastic chemotherapy: Secondary | ICD-10-CM | POA: Insufficient documentation

## 2017-01-23 DIAGNOSIS — Z5112 Encounter for antineoplastic immunotherapy: Secondary | ICD-10-CM | POA: Insufficient documentation

## 2017-01-23 DIAGNOSIS — Z79899 Other long term (current) drug therapy: Secondary | ICD-10-CM | POA: Insufficient documentation

## 2017-01-23 DIAGNOSIS — J90 Pleural effusion, not elsewhere classified: Secondary | ICD-10-CM | POA: Insufficient documentation

## 2017-01-23 DIAGNOSIS — K7460 Unspecified cirrhosis of liver: Secondary | ICD-10-CM | POA: Diagnosis not present

## 2017-01-23 DIAGNOSIS — C50412 Malignant neoplasm of upper-outer quadrant of left female breast: Secondary | ICD-10-CM | POA: Insufficient documentation

## 2017-01-23 DIAGNOSIS — Z7982 Long term (current) use of aspirin: Secondary | ICD-10-CM | POA: Insufficient documentation

## 2017-01-23 DIAGNOSIS — C7951 Secondary malignant neoplasm of bone: Secondary | ICD-10-CM | POA: Insufficient documentation

## 2017-01-23 DIAGNOSIS — I10 Essential (primary) hypertension: Secondary | ICD-10-CM | POA: Diagnosis not present

## 2017-01-23 DIAGNOSIS — C7800 Secondary malignant neoplasm of unspecified lung: Secondary | ICD-10-CM | POA: Insufficient documentation

## 2017-01-23 DIAGNOSIS — E119 Type 2 diabetes mellitus without complications: Secondary | ICD-10-CM | POA: Diagnosis not present

## 2017-01-23 DIAGNOSIS — Z7984 Long term (current) use of oral hypoglycemic drugs: Secondary | ICD-10-CM | POA: Insufficient documentation

## 2017-01-23 LAB — CBC WITH DIFFERENTIAL/PLATELET
Basophils Absolute: 0 10*3/uL (ref 0–0.1)
Basophils Relative: 1 %
EOS ABS: 0.2 10*3/uL (ref 0–0.7)
EOS PCT: 6 %
HCT: 30.7 % — ABNORMAL LOW (ref 35.0–47.0)
Hemoglobin: 10.2 g/dL — ABNORMAL LOW (ref 12.0–16.0)
Lymphocytes Relative: 45 %
Lymphs Abs: 1.2 10*3/uL (ref 1.0–3.6)
MCH: 28.3 pg (ref 26.0–34.0)
MCHC: 33.3 g/dL (ref 32.0–36.0)
MCV: 85 fL (ref 80.0–100.0)
Monocytes Absolute: 0.2 10*3/uL (ref 0.2–0.9)
Monocytes Relative: 8 %
Neutro Abs: 1.1 10*3/uL — ABNORMAL LOW (ref 1.4–6.5)
Neutrophils Relative %: 40 %
PLATELETS: 171 10*3/uL (ref 150–440)
RBC: 3.61 MIL/uL — AB (ref 3.80–5.20)
RDW: 20.1 % — AB (ref 11.5–14.5)
WBC: 2.7 10*3/uL — AB (ref 3.6–11.0)

## 2017-01-23 MED ORDER — SODIUM CHLORIDE 0.9% FLUSH
10.0000 mL | INTRAVENOUS | Status: DC | PRN
  Administered 2017-01-23: 10 mL via INTRAVENOUS
  Filled 2017-01-23: qty 10

## 2017-01-23 MED ORDER — PACLITAXEL CHEMO INJECTION 300 MG/50ML
80.0000 mg/m2 | Freq: Once | INTRAVENOUS | Status: AC
  Administered 2017-01-23: 138 mg via INTRAVENOUS
  Filled 2017-01-23: qty 23

## 2017-01-23 MED ORDER — HEPARIN SOD (PORK) LOCK FLUSH 100 UNIT/ML IV SOLN
500.0000 [IU] | Freq: Once | INTRAVENOUS | Status: AC
  Administered 2017-01-23: 500 [IU] via INTRAVENOUS
  Filled 2017-01-23: qty 5

## 2017-01-23 MED ORDER — SODIUM CHLORIDE 0.9 % IV SOLN
Freq: Once | INTRAVENOUS | Status: AC
  Administered 2017-01-23: 10:00:00 via INTRAVENOUS
  Filled 2017-01-23: qty 1000

## 2017-01-23 MED ORDER — DIPHENHYDRAMINE HCL 50 MG/ML IJ SOLN
50.0000 mg | Freq: Once | INTRAMUSCULAR | Status: AC
  Administered 2017-01-23: 50 mg via INTRAVENOUS
  Filled 2017-01-23: qty 1

## 2017-01-23 MED ORDER — ACETAMINOPHEN 325 MG PO TABS
650.0000 mg | ORAL_TABLET | Freq: Once | ORAL | Status: AC
  Administered 2017-01-23: 650 mg via ORAL
  Filled 2017-01-23: qty 2

## 2017-01-23 MED ORDER — HEPARIN SOD (PORK) LOCK FLUSH 100 UNIT/ML IV SOLN: 500.0000 [IU] | Freq: Once | INTRAVENOUS | Status: DC | PRN

## 2017-01-23 MED ORDER — SODIUM CHLORIDE 0.9 % IV SOLN
20.0000 mg | Freq: Once | INTRAVENOUS | Status: AC
  Administered 2017-01-23: 20 mg via INTRAVENOUS
  Filled 2017-01-23: qty 2

## 2017-01-23 MED ORDER — TRASTUZUMAB CHEMO 150 MG IV SOLR
2.0000 mg/kg | Freq: Once | INTRAVENOUS | Status: AC
  Administered 2017-01-23: 126 mg via INTRAVENOUS
  Filled 2017-01-23: qty 6

## 2017-01-23 MED ORDER — FAMOTIDINE IN NACL 20-0.9 MG/50ML-% IV SOLN
20.0000 mg | Freq: Once | INTRAVENOUS | Status: AC
  Administered 2017-01-23: 20 mg via INTRAVENOUS
  Filled 2017-01-23: qty 50

## 2017-01-23 NOTE — Progress Notes (Signed)
ANC 1.1. Nurse called to check with MD. MD ok to proceed with treatment

## 2017-01-23 NOTE — Progress Notes (Signed)
Per Dr B, ok to proceed with tx r/t pts labs today.

## 2017-01-25 ENCOUNTER — Inpatient Hospital Stay: Payer: Medicare Other | Admitting: Internal Medicine

## 2017-01-30 ENCOUNTER — Inpatient Hospital Stay: Payer: Medicare Other

## 2017-01-30 VITALS — BP 151/84 | HR 88 | Temp 97.0°F | Resp 18

## 2017-01-30 DIAGNOSIS — Z5112 Encounter for antineoplastic immunotherapy: Secondary | ICD-10-CM | POA: Diagnosis not present

## 2017-01-30 DIAGNOSIS — C50412 Malignant neoplasm of upper-outer quadrant of left female breast: Secondary | ICD-10-CM | POA: Diagnosis not present

## 2017-01-30 DIAGNOSIS — C7800 Secondary malignant neoplasm of unspecified lung: Secondary | ICD-10-CM | POA: Diagnosis not present

## 2017-01-30 DIAGNOSIS — C50919 Malignant neoplasm of unspecified site of unspecified female breast: Secondary | ICD-10-CM

## 2017-01-30 DIAGNOSIS — Z170 Estrogen receptor positive status [ER+]: Secondary | ICD-10-CM | POA: Diagnosis not present

## 2017-01-30 DIAGNOSIS — Z5111 Encounter for antineoplastic chemotherapy: Secondary | ICD-10-CM | POA: Diagnosis not present

## 2017-01-30 DIAGNOSIS — C7951 Secondary malignant neoplasm of bone: Secondary | ICD-10-CM | POA: Diagnosis not present

## 2017-01-30 LAB — CBC WITH DIFFERENTIAL/PLATELET
BASOS PCT: 1 %
Basophils Absolute: 0 10*3/uL (ref 0–0.1)
Eosinophils Absolute: 0.1 10*3/uL (ref 0–0.7)
Eosinophils Relative: 4 %
HCT: 32 % — ABNORMAL LOW (ref 35.0–47.0)
HEMOGLOBIN: 10.6 g/dL — AB (ref 12.0–16.0)
Lymphocytes Relative: 44 %
Lymphs Abs: 1.3 10*3/uL (ref 1.0–3.6)
MCH: 28.3 pg (ref 26.0–34.0)
MCHC: 33.1 g/dL (ref 32.0–36.0)
MCV: 85.7 fL (ref 80.0–100.0)
MONOS PCT: 6 %
Monocytes Absolute: 0.2 10*3/uL (ref 0.2–0.9)
NEUTROS ABS: 1.3 10*3/uL — AB (ref 1.4–6.5)
NEUTROS PCT: 45 %
Platelets: 215 10*3/uL (ref 150–440)
RBC: 3.73 MIL/uL — AB (ref 3.80–5.20)
RDW: 20.7 % — ABNORMAL HIGH (ref 11.5–14.5)
WBC: 2.9 10*3/uL — ABNORMAL LOW (ref 3.6–11.0)

## 2017-01-30 MED ORDER — HEPARIN SOD (PORK) LOCK FLUSH 100 UNIT/ML IV SOLN
500.0000 [IU] | Freq: Once | INTRAVENOUS | Status: AC | PRN
  Administered 2017-01-30: 500 [IU]
  Filled 2017-01-30: qty 5

## 2017-01-30 MED ORDER — FAMOTIDINE IN NACL 20-0.9 MG/50ML-% IV SOLN
20.0000 mg | Freq: Once | INTRAVENOUS | Status: AC
  Administered 2017-01-30: 20 mg via INTRAVENOUS
  Filled 2017-01-30: qty 50

## 2017-01-30 MED ORDER — PACLITAXEL CHEMO INJECTION 300 MG/50ML
80.0000 mg/m2 | Freq: Once | INTRAVENOUS | Status: AC
  Administered 2017-01-30: 138 mg via INTRAVENOUS
  Filled 2017-01-30: qty 23

## 2017-01-30 MED ORDER — DIPHENHYDRAMINE HCL 50 MG/ML IJ SOLN
50.0000 mg | Freq: Once | INTRAMUSCULAR | Status: AC
  Administered 2017-01-30: 50 mg via INTRAVENOUS
  Filled 2017-01-30: qty 1

## 2017-01-30 MED ORDER — SODIUM CHLORIDE 0.9 % IV SOLN
2.0000 mg/kg | Freq: Once | INTRAVENOUS | Status: AC
  Administered 2017-01-30: 126 mg via INTRAVENOUS
  Filled 2017-01-30: qty 6

## 2017-01-30 MED ORDER — ACETAMINOPHEN 325 MG PO TABS
650.0000 mg | ORAL_TABLET | Freq: Once | ORAL | Status: AC
  Administered 2017-01-30: 650 mg via ORAL
  Filled 2017-01-30: qty 2

## 2017-01-30 MED ORDER — SODIUM CHLORIDE 0.9 % IV SOLN
20.0000 mg | Freq: Once | INTRAVENOUS | Status: AC
  Administered 2017-01-30: 20 mg via INTRAVENOUS
  Filled 2017-01-30: qty 2

## 2017-01-30 MED ORDER — SODIUM CHLORIDE 0.9 % IV SOLN
Freq: Once | INTRAVENOUS | Status: AC
  Administered 2017-01-30: 10:00:00 via INTRAVENOUS
  Filled 2017-01-30: qty 1000

## 2017-01-30 NOTE — Progress Notes (Signed)
OK to proceed today with tx without CMP per Renita Papa, RN.

## 2017-02-01 ENCOUNTER — Ambulatory Visit (INDEPENDENT_AMBULATORY_CARE_PROVIDER_SITE_OTHER): Payer: Medicare Other | Admitting: Unknown Physician Specialty

## 2017-02-01 ENCOUNTER — Encounter: Payer: Self-pay | Admitting: Unknown Physician Specialty

## 2017-02-01 DIAGNOSIS — I10 Essential (primary) hypertension: Secondary | ICD-10-CM

## 2017-02-01 NOTE — Progress Notes (Signed)
BP 131/80 (BP Location: Left Arm, Cuff Size: Small)   Pulse (!) 114   Temp 98.6 F (37 C)   Wt 130 lb (59 kg)   LMP  (LMP Unknown)   SpO2 98%   BMI 23.03 kg/m    Subjective:    Patient ID: Tammy Pope, female    DOB: 1946/09/19, 71 y.o.   MRN: [id number]  HPI: Tammy Pope is a 71 y.o. female  Chief Complaint  Patient presents with  . Hypertension    4 week f/up   Hypertension Using medications without difficulty.  "I feel fine" Average home BPs Not checking  No problems or lightheadedness No chest pain with exertion or shortness of breath No Edema   Relevant past medical, surgical, family and social history reviewed and updated as indicated. Interim medical history since our last visit reviewed. Allergies and medications reviewed and updated.  Review of Systems  Per HPI unless specifically indicated above     Objective:    BP 131/80 (BP Location: Left Arm, Cuff Size: Small)   Pulse (!) 114   Temp 98.6 F (37 C)   Wt 130 lb (59 kg)   LMP  (LMP Unknown)   SpO2 98%   BMI 23.03 kg/m   Wt Readings from Last 3 Encounters:  02/01/17 130 lb (59 kg)  01/16/17 129 lb 4 oz (58.6 kg)  12/31/16 133 lb 3.2 oz (60.4 kg)    Physical Exam  Constitutional: She is oriented to person, place, and time. She appears well-developed and well-nourished. No distress.  HENT:  Head: Normocephalic and atraumatic.  Eyes: Conjunctivae and lids are normal. Right eye exhibits no discharge. Left eye exhibits no discharge. No scleral icterus.  Neck: Normal range of motion. Neck supple. No JVD present. Carotid bruit is not present.  Cardiovascular: Normal rate, regular rhythm and normal heart sounds.   Pulmonary/Chest: Effort normal and breath sounds normal.  Abdominal: Normal appearance. There is no splenomegaly or hepatomegaly.  Musculoskeletal: Normal range of motion.  Neurological: She is alert and oriented to person, place, and time.  Skin: Skin is warm, dry and intact. No  rash noted. No pallor.  Psychiatric: She has a normal mood and affect. Her behavior is normal. Judgment and thought content normal.    Results for orders placed or performed in visit on 01/30/17  CBC with Differential/Platelet  Result Value Ref Range   WBC 2.9 (L) 3.6 - 11.0 K/uL   RBC 3.73 (L) 3.80 - 5.20 MIL/uL   Hemoglobin 10.6 (L) 12.0 - 16.0 g/dL   HCT 32.0 (L) 35.0 - 47.0 %   MCV 85.7 80.0 - 100.0 fL   MCH 28.3 26.0 - 34.0 pg   MCHC 33.1 32.0 - 36.0 g/dL   RDW 20.7 (H) 11.5 - 14.5 %   Platelets 215 150 - 440 K/uL   Neutrophils Relative % 45 %   Neutro Abs 1.3 (L) 1.4 - 6.5 K/uL   Lymphocytes Relative 44 %   Lymphs Abs 1.3 1.0 - 3.6 K/uL   Monocytes Relative 6 %   Monocytes Absolute 0.2 0.2 - 0.9 K/uL   Eosinophils Relative 4 %   Eosinophils Absolute 0.1 0 - 0.7 K/uL   Basophils Relative 1 %   Basophils Absolute 0.0 0 - 0.1 K/uL      Assessment & Plan:   Problem List Items Addressed This Visit      Unprioritized   Hypertension    BP is normal with SBP  131 with standing          Follow up plan: Return in about 2 months (around 04/01/2017).

## 2017-02-01 NOTE — Assessment & Plan Note (Signed)
BP is normal with SBP 131 with standing

## 2017-02-06 ENCOUNTER — Inpatient Hospital Stay: Payer: Medicare Other

## 2017-02-06 ENCOUNTER — Inpatient Hospital Stay (HOSPITAL_BASED_OUTPATIENT_CLINIC_OR_DEPARTMENT_OTHER): Payer: Medicare Other | Admitting: Internal Medicine

## 2017-02-06 VITALS — BP 125/71 | HR 78

## 2017-02-06 VITALS — BP 161/82 | HR 99 | Temp 96.1°F | Resp 18 | Ht 63.0 in | Wt 129.9 lb

## 2017-02-06 DIAGNOSIS — Z8041 Family history of malignant neoplasm of ovary: Secondary | ICD-10-CM

## 2017-02-06 DIAGNOSIS — I8500 Esophageal varices without bleeding: Secondary | ICD-10-CM

## 2017-02-06 DIAGNOSIS — Z800 Family history of malignant neoplasm of digestive organs: Secondary | ICD-10-CM

## 2017-02-06 DIAGNOSIS — Z803 Family history of malignant neoplasm of breast: Secondary | ICD-10-CM

## 2017-02-06 DIAGNOSIS — Z5111 Encounter for antineoplastic chemotherapy: Secondary | ICD-10-CM | POA: Diagnosis not present

## 2017-02-06 DIAGNOSIS — C50412 Malignant neoplasm of upper-outer quadrant of left female breast: Secondary | ICD-10-CM

## 2017-02-06 DIAGNOSIS — J90 Pleural effusion, not elsewhere classified: Secondary | ICD-10-CM

## 2017-02-06 DIAGNOSIS — I10 Essential (primary) hypertension: Secondary | ICD-10-CM

## 2017-02-06 DIAGNOSIS — Z170 Estrogen receptor positive status [ER+]: Principal | ICD-10-CM

## 2017-02-06 DIAGNOSIS — Z7984 Long term (current) use of oral hypoglycemic drugs: Secondary | ICD-10-CM

## 2017-02-06 DIAGNOSIS — Z79899 Other long term (current) drug therapy: Secondary | ICD-10-CM

## 2017-02-06 DIAGNOSIS — K7460 Unspecified cirrhosis of liver: Secondary | ICD-10-CM | POA: Diagnosis not present

## 2017-02-06 DIAGNOSIS — C7951 Secondary malignant neoplasm of bone: Secondary | ICD-10-CM | POA: Diagnosis not present

## 2017-02-06 DIAGNOSIS — E119 Type 2 diabetes mellitus without complications: Secondary | ICD-10-CM

## 2017-02-06 DIAGNOSIS — Z5112 Encounter for antineoplastic immunotherapy: Secondary | ICD-10-CM | POA: Diagnosis not present

## 2017-02-06 DIAGNOSIS — I87309 Chronic venous hypertension (idiopathic) without complications of unspecified lower extremity: Secondary | ICD-10-CM

## 2017-02-06 DIAGNOSIS — Z7982 Long term (current) use of aspirin: Secondary | ICD-10-CM

## 2017-02-06 DIAGNOSIS — E785 Hyperlipidemia, unspecified: Secondary | ICD-10-CM | POA: Diagnosis not present

## 2017-02-06 DIAGNOSIS — C7800 Secondary malignant neoplasm of unspecified lung: Secondary | ICD-10-CM | POA: Diagnosis not present

## 2017-02-06 LAB — CBC WITH DIFFERENTIAL/PLATELET
Basophils Absolute: 0 10*3/uL (ref 0–0.1)
Basophils Relative: 2 %
EOS ABS: 0.1 10*3/uL (ref 0–0.7)
Eosinophils Relative: 3 %
HCT: 31.9 % — ABNORMAL LOW (ref 35.0–47.0)
HEMOGLOBIN: 10.6 g/dL — AB (ref 12.0–16.0)
LYMPHS ABS: 1.3 10*3/uL (ref 1.0–3.6)
LYMPHS PCT: 39 %
MCH: 28.2 pg (ref 26.0–34.0)
MCHC: 33.2 g/dL (ref 32.0–36.0)
MCV: 85.1 fL (ref 80.0–100.0)
Monocytes Absolute: 0.3 10*3/uL (ref 0.2–0.9)
Monocytes Relative: 8 %
Neutro Abs: 1.6 10*3/uL (ref 1.4–6.5)
Neutrophils Relative %: 48 %
Platelets: 225 10*3/uL (ref 150–440)
RBC: 3.75 MIL/uL — AB (ref 3.80–5.20)
RDW: 20.7 % — ABNORMAL HIGH (ref 11.5–14.5)
WBC: 3.2 10*3/uL — AB (ref 3.6–11.0)

## 2017-02-06 LAB — COMPREHENSIVE METABOLIC PANEL
ALBUMIN: 4 g/dL (ref 3.5–5.0)
ALK PHOS: 126 U/L (ref 38–126)
ALT: 31 U/L (ref 14–54)
AST: 38 U/L (ref 15–41)
Anion gap: 8 (ref 5–15)
BUN: 8 mg/dL (ref 6–20)
CALCIUM: 9.2 mg/dL (ref 8.9–10.3)
CO2: 26 mmol/L (ref 22–32)
Chloride: 106 mmol/L (ref 101–111)
Creatinine, Ser: 0.65 mg/dL (ref 0.44–1.00)
GFR calc non Af Amer: >60 mL/min (ref >60)
GLUCOSE: 173 mg/dL — AB (ref 65–99)
Potassium: 3.8 mmol/L (ref 3.5–5.1)
SODIUM: 140 mmol/L (ref 135–145)
Total Bilirubin: 0.8 mg/dL (ref 0.3–1.2)
Total Protein: 7.2 g/dL (ref 6.5–8.1)

## 2017-02-06 MED ORDER — SODIUM CHLORIDE 0.9 % IV SOLN
Freq: Once | INTRAVENOUS | Status: AC
  Administered 2017-02-06: 10:00:00 via INTRAVENOUS
  Filled 2017-02-06: qty 1000

## 2017-02-06 MED ORDER — HEPARIN SOD (PORK) LOCK FLUSH 100 UNIT/ML IV SOLN
500.0000 [IU] | Freq: Once | INTRAVENOUS | Status: AC
  Administered 2017-02-06: 500 [IU] via INTRAVENOUS
  Filled 2017-02-06: qty 5

## 2017-02-06 MED ORDER — SODIUM CHLORIDE 0.9 % IV SOLN
20.0000 mg | Freq: Once | INTRAVENOUS | Status: AC
  Administered 2017-02-06: 20 mg via INTRAVENOUS
  Filled 2017-02-06: qty 2

## 2017-02-06 MED ORDER — SODIUM CHLORIDE 0.9 % IV SOLN
2.0000 mg/kg | Freq: Once | INTRAVENOUS | Status: AC
  Administered 2017-02-06: 126 mg via INTRAVENOUS
  Filled 2017-02-06: qty 6

## 2017-02-06 MED ORDER — SODIUM CHLORIDE 0.9% FLUSH
10.0000 mL | INTRAVENOUS | Status: DC | PRN
  Filled 2017-02-06: qty 10

## 2017-02-06 MED ORDER — ACETAMINOPHEN 325 MG PO TABS
650.0000 mg | ORAL_TABLET | Freq: Once | ORAL | Status: AC
  Administered 2017-02-06: 650 mg via ORAL
  Filled 2017-02-06: qty 2

## 2017-02-06 MED ORDER — SODIUM CHLORIDE 0.9% FLUSH
10.0000 mL | Freq: Once | INTRAVENOUS | Status: AC
  Administered 2017-02-06: 10 mL via INTRAVENOUS
  Filled 2017-02-06: qty 10

## 2017-02-06 MED ORDER — FAMOTIDINE IN NACL 20-0.9 MG/50ML-% IV SOLN
20.0000 mg | Freq: Once | INTRAVENOUS | Status: AC
  Administered 2017-02-06: 20 mg via INTRAVENOUS
  Filled 2017-02-06: qty 50

## 2017-02-06 MED ORDER — DIPHENHYDRAMINE HCL 50 MG/ML IJ SOLN
50.0000 mg | Freq: Once | INTRAMUSCULAR | Status: AC
  Administered 2017-02-06: 50 mg via INTRAVENOUS
  Filled 2017-02-06: qty 1

## 2017-02-06 MED ORDER — HEPARIN SOD (PORK) LOCK FLUSH 100 UNIT/ML IV SOLN: 500.0000 [IU] | Freq: Once | INTRAVENOUS | Status: DC | PRN

## 2017-02-06 MED ORDER — SODIUM CHLORIDE 0.9 % IV SOLN
80.0000 mg/m2 | Freq: Once | INTRAVENOUS | Status: AC
  Administered 2017-02-06: 138 mg via INTRAVENOUS
  Filled 2017-02-06: qty 23

## 2017-02-06 NOTE — Progress Notes (Signed)
[place] OFFICE PROGRESS NOTE  Patient Care Team: Kathrine Haddock, NP as PCP - General (Nurse Practitioner) Robert Bellow, MD (General Surgery) Forest Gleason, MD (Oncology)  Breast cancer [facility])   Staging form: Breast, AJCC 7th Edition     Clinical stage from 04/06/2015: Stage IV (T2, N2, M1) - Signed by Evlyn Kanner, NP on 04/06/2015    Oncology History   1.history of carcinoma of right breast status post lumpectomy and radiation therapy (upper and outer quadrant) February 21, 2009.  Estrogen receptor 90%.  Progesterone receptor 60%.  Patient had lumpectomy and MammoSite partial breast radiation therapy followed by tamoxifen for 5 years.  Patient is still taking tamoxifen.. 2 recent mammogram in December of 2015 negative.  Further evaluation by surgeon revealed a palpable mass in the left upper outer quadrant near axilla.  Ultrasound revealed one small normal-appearing lymph node 0.6 cm in lower limit of axilla.  Adjacent to this there was ill-defined multilobulated hypoechoic mass 1.8 x 2.6 cm.  Biopsy of the left axillary mass was done which was positive for high-grade carcinoma.   2,estrogen receptor positive.  Progesterone receptor positive.  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 3.  MRI scan of breast (January, 2015) reveals multiple abnormal enlarged left axillary lymph node There are 2 oval some circumscribed mass at 2:30 and 3:00 position of the left breast.  0.6 cm x 1.3 cm and 4 x 7 mm Non-mass enhancement extending 2.4 cm these masses is 3.6 cm mass.  Findings suggestive of several pulmonary nodules.  PET  scan shows extensive disease, 4.started on Taxotere,perjeta, Herceptin(January 03, 2015- June 2016.   # June 2016- Herceptin [Letrozole + Ibrance] until May 2017.   # MAY 2017- PET PROGRESSION; # July 2017- START KADCYLA  x4 cycles- SEP 15th 2017CT- PROGRESSION  # SEP 2017- START Taxol + Herceptin.   # Right sided Pleural effusion [s/p Thora Sep  2017]  # Bone mets- X-geva q 6W  # MUGA Jan 18th-63%     Carcinoma of upper-outer quadrant of left breast in female, estrogen receptor positive ([place])   09/28/2016 Initial Diagnosis    Carcinoma of upper-outer quadrant of left breast in female, estrogen receptor positive ([place])       INTERVAL HISTORY:  Tammy Pope 71 y.o.  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  Patient denies any tingling or numbness. Appetite is good. No weight loss. Denies any worsening shortness of breath. Denies any bone pain. No fevers or chills. Her appetite is good. Denies any new onset of skin rash.   REVIEW OF SYSTEMS:  A complete 10 point review of system is done which is negative except mentioned above/history of present illness.   PAST MEDICAL HISTORY :  Past Medical History:  Diagnosis Date  . Axillary mass 12/15/2014  . Breast cancer ([place]) 02/21/2009  . Cancer [facility]) 2010   right breast ductal carcinoma in situ  . Diabetes mellitus without complication ([place])   . Floaters 2014  . Hyperlipidemia 1990  . Hypertension 2009  . Malignant neoplasm of axillary tail of female breast ([place]) 12/16/2014  . Malignant neoplasm of upper-outer quadrant of female breast ([place]) 02/21/2009   DCIS of the right breast, intermediate grade, resected the negative margins. ER 90%, PR 50%, MammoSite partial breast radiation  . 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  Special screening for malignant neoplasms, colon     PAST SURGICAL HISTORY :   Past Surgical History:  Procedure Laterality Date  . APPENDECTOMY  1956  . BREAST BIOPSY Right 2010   stereo  . BREAST SURGERY Right 2010   wide excision  . COLONOSCOPY  2008   ??? Md  . left axilla biopsy Left 12-14-14  . PARTIAL HYSTERECTOMY  1977    FAMILY HISTORY :   Family History  Problem  Relation Age of Onset  . Cancer Other     unknown family members with ovarian,colon,breast cancers  . Hypertension Mother   . Heart disease Daughter     MI    SOCIAL HISTORY:   Social History  Substance Use Topics  . Smoking status: Never Smoker  . Smokeless tobacco: Never Used  . Alcohol use No    ALLERGIES:  is allergic to penicillin g and sulfa antibiotics.  MEDICATIONS:  Current Outpatient Prescriptions  Medication Sig Dispense Refill  . ALPRAZolam (XANAX) 0.5 MG tablet Take 1 tablet (0.5 mg total) by mouth 3 (three) times daily as needed. 90 tablet 2  . amLODipine (NORVASC) 5 MG tablet Take 1 tablet (5 mg total) by mouth daily. 90 tablet 3  . aspirin 81 MG tablet Take 81 mg by mouth daily.    . Calcium 600-200 MG-UNIT tablet Take 1 tablet by mouth 2 (two) times daily. 120 tablet 3  . metFORMIN (GLUCOPHAGE) 500 MG tablet Take 2 tablets (1,000 mg total) by mouth 2 (two) times daily with a meal. 360 tablet 5  . Multiple Vitamins-Minerals (MULTIVITAMIN ADULT PO) Take by mouth.    . ONE TOUCH ULTRA TEST test strip     . potassium chloride SA (KLOR-CON M20) 20 MEQ tablet Take 2 tablets (40 mEq total) by mouth daily. 60 tablet 3  . rosuvastatin (CRESTOR) 5 MG tablet Take 1 tablet (5 mg total) by mouth at bedtime. 90 tablet 1  . valsartan-hydrochlorothiazide (DIOVAN-HCT) 320-25 MG tablet Take 1 tablet by mouth daily. 90 tablet 3   No current facility-administered medications for this visit.    Facility-Administered Medications Ordered in Other Visits  Medication Dose Route Frequency Provider Last Rate Last Dose  . heparin lock flush 100 unit/mL  250 Units Intracatheter PRN Forest Gleason, MD      . heparin lock flush 100 unit/mL  500 Units Intracatheter Once PRN Cammie Sickle, MD      . sodium chloride 0.9 % 250 mL with potassium chloride 40 mEq infusion   Intravenous Continuous Forest Gleason, MD   Stopped at 05/03/15 1229  . sodium chloride 0.9 % injection 10 mL  10 mL  Intracatheter PRN Forest Gleason, MD   10 mL at 04/18/15 1545  . sodium chloride flush (NS) 0.9 % injection 10 mL  10 mL Intracatheter PRN Cammie Sickle, MD        PHYSICAL EXAMINATION: ECOG PERFORMANCE STATUS: 0 - Asymptomatic  BP (!) 161/82 (Patient Position: Sitting)   Pulse 99   Temp (!) 96.1 F (35.6 C) (Tympanic)   Resp 18   Ht '5\' 3"'  (1.6 m)   Wt 129 lb 14.4 oz (58.9 kg)   LMP  (LMP Unknown)   BMI 23.01 kg/m   Filed Weights   02/06/17 0851  Weight: 129 lb 14.4 oz (58.9 kg)    GENERAL: Well-nourished well-developed; Alert, no distress and comfortable.   Accompanied by her husband. EYES: no pallor or icterus OROPHARYNX: no thrush or ulceration; poor  dentition  NECK: supple, no masses felt LYMPH:  no palpable lymphadenopathy in the cervical, axillary or inguinal regions LUNGS: clear to auscultation and  No wheeze or crackles HEART/CVS: regular rate & rhythm and no murmurs; No lower extremity edema ABDOMEN:abdomen soft, non-tender and normal bowel sounds Musculoskeletal:no cyanosis of digits and no clubbing  PSYCH: alert & oriented x 3 with fluent speech NEURO: no focal motor/sensory deficits SKIN:  no rashes or significant lesions LEFT BREAST- 1-2cm mass left upper outer quadrant/axillary tail. Nontender. Skin: macular rash noted on the bilateral extremities  LABORATORY DATA:  I have reviewed the data as listed    Component Value Date/Time   NA 140 02/06/2017 0835   NA 136 09/16/2015 0843   NA 138 03/28/2015 0923   K 3.8 02/06/2017 0835   K 3.3 (L) 03/28/2015 0923   CL 106 02/06/2017 0835   CL 101 03/28/2015 0923   CO2 26 02/06/2017 0835   CO2 28 03/28/2015 0923   GLUCOSE 173 (H) 02/06/2017 0835   GLUCOSE 173 (H) 03/28/2015 0923   BUN 8 02/06/2017 0835   BUN 10 09/16/2015 0843   BUN 9 03/28/2015 0923   CREATININE 0.65 02/06/2017 0835   CREATININE 0.56 03/28/2015 0923   CALCIUM 9.2 02/06/2017 0835   CALCIUM 8.6 (L) 03/28/2015 0923   PROT 7.2  02/06/2017 0835   PROT 7.2 09/16/2015 0843   PROT 7.1 03/28/2015 0923   ALBUMIN 4.0 02/06/2017 0835   ALBUMIN 4.6 09/16/2015 0843   ALBUMIN 3.6 03/28/2015 0923   AST 38 02/06/2017 0835   AST 43 (H) 03/28/2015 0923   ALT 31 02/06/2017 0835   ALT 26 03/28/2015 0923   ALKPHOS 126 02/06/2017 0835   ALKPHOS 85 03/28/2015 0923   BILITOT 0.8 02/06/2017 0835   BILITOT 0.9 09/16/2015 0843   BILITOT 0.6 03/28/2015 0923   GFRNONAA >60 02/06/2017 0835   GFRNONAA >60 03/28/2015 0923   GFRAA >60 02/06/2017 0835   GFRAA >60 03/28/2015 0923    No results found for: SPEP, UPEP  Lab Results  Component Value Date   WBC 3.2 (L) 02/06/2017   NEUTROABS 1.6 02/06/2017   HGB 10.6 (L) 02/06/2017   HCT 31.9 (L) 02/06/2017   MCV 85.1 02/06/2017   PLT 225 02/06/2017      Chemistry      Component Value Date/Time   NA 140 02/06/2017 0835   NA 136 09/16/2015 0843   NA 138 03/28/2015 0923   K 3.8 02/06/2017 0835   K 3.3 (L) 03/28/2015 0923   CL 106 02/06/2017 0835   CL 101 03/28/2015 0923   CO2 26 02/06/2017 0835   CO2 28 03/28/2015 0923   BUN 8 02/06/2017 0835   BUN 10 09/16/2015 0843   BUN 9 03/28/2015 0923   CREATININE 0.65 02/06/2017 0835   CREATININE 0.56 03/28/2015 0923      Component Value Date/Time   CALCIUM 9.2 02/06/2017 0835   CALCIUM 8.6 (L) 03/28/2015 0923   ALKPHOS 126 02/06/2017 0835   ALKPHOS 85 03/28/2015 0923   AST 38 02/06/2017 0835   AST 43 (H) 03/28/2015 0923   ALT 31 02/06/2017 0835   ALT 26 03/28/2015 0923   BILITOT 0.8 02/06/2017 0835   BILITOT 0.9 09/16/2015 0843   BILITOT 0.6 03/28/2015 0923     IMPRESSION: 1. Overall, interval response to therapy. 2. Resolution of right pleural effusion with significant interval appearance of pleural disease involving the right lung. No measurable pleural tumor identified at this time  3. Significant interval improvement in the appearance of left lateral breast lesion and left ventral chest wall lesion. 4. Morphologic  features of the liver compatible with cirrhosis and mild portal venous hypertension with small esophageal varices. 5. Stable appearance of diffuse sclerotic bone metastases. 6. Peripheral and basilar predominant interstitial reticulation is noted affecting both lungs, suggestive of chronic interstitial lung disease.   Electronically Signed   By: Kerby Moors M.D.   On: 12/21/2016 12:03RADIOGRAPHIC STUDIES: I have personally reviewed the radiological images as listed and agreed with the findings in the report. No results found.   ASSESSMENT & PLAN:  Carcinoma of upper-outer quadrant of left breast in female, estrogen receptor positive ([place]) Metastatic breast cancer to the bone/lung. On taxol- Herceptin cycle weekly. JAN 5th 2017- CT scan - significant improvement.    # proceed with cycle #5 day 15 today. Tolerating chemo well; jan 2018- MUGA scan- 63%.   # Bone metastases on Xgeva;   # weekly/ chemo/labs; follow up in 3 weeks/MD/chemo/ X-geva.   Orders Placed This Encounter  Procedures  . CBC with Differential    Standing Status:   Standing    Number of Occurrences:   20    Standing Expiration Date:   02/06/2018  . Comprehensive metabolic panel    Standing Status:   Future    Standing Expiration Date:   02/06/2018       Cammie Sickle, MD 02/06/2017 4:55 PM

## 2017-02-06 NOTE — Assessment & Plan Note (Addendum)
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 On taxol- Herceptin cycle weekly. JAN 5th 2017- CT scan - significant improvement.  Clinically no progression of disease noted.  # proceed with cycle #5 day 15 today. Tolerating chemo well; jan 2018- MUGA scan- 63%.   # Bone metastases on Xgeva;   # weekly/ chemo/labs; follow up in 3 weeks/MD/chemo/ X-geva.

## 2017-02-13 ENCOUNTER — Inpatient Hospital Stay: Payer: Medicare Other

## 2017-02-13 VITALS — BP 156/88 | HR 84 | Temp 97.6°F | Resp 18

## 2017-02-13 DIAGNOSIS — C7800 Secondary malignant neoplasm of unspecified lung: Secondary | ICD-10-CM | POA: Diagnosis not present

## 2017-02-13 DIAGNOSIS — C7951 Secondary malignant neoplasm of bone: Secondary | ICD-10-CM | POA: Diagnosis not present

## 2017-02-13 DIAGNOSIS — Z170 Estrogen receptor positive status [ER+]: Principal | ICD-10-CM

## 2017-02-13 DIAGNOSIS — Z5112 Encounter for antineoplastic immunotherapy: Secondary | ICD-10-CM | POA: Diagnosis not present

## 2017-02-13 DIAGNOSIS — C50412 Malignant neoplasm of upper-outer quadrant of left female breast: Secondary | ICD-10-CM

## 2017-02-13 DIAGNOSIS — Z5111 Encounter for antineoplastic chemotherapy: Secondary | ICD-10-CM | POA: Diagnosis not present

## 2017-02-13 LAB — COMPREHENSIVE METABOLIC PANEL
ALBUMIN: 3.9 g/dL (ref 3.5–5.0)
ALT: 32 U/L (ref 14–54)
AST: 41 U/L (ref 15–41)
Alkaline Phosphatase: 124 U/L (ref 38–126)
Anion gap: 9 (ref 5–15)
BUN: 9 mg/dL (ref 6–20)
CHLORIDE: 105 mmol/L (ref 101–111)
CO2: 25 mmol/L (ref 22–32)
CREATININE: 0.61 mg/dL (ref 0.44–1.00)
Calcium: 8.9 mg/dL (ref 8.9–10.3)
GFR calc Af Amer: >60 mL/min (ref >60)
GLUCOSE: 161 mg/dL — AB (ref 65–99)
POTASSIUM: 3.5 mmol/L (ref 3.5–5.1)
Sodium: 139 mmol/L (ref 135–145)
Total Bilirubin: 0.7 mg/dL (ref 0.3–1.2)
Total Protein: 7.1 g/dL (ref 6.5–8.1)

## 2017-02-13 LAB — CBC WITH DIFFERENTIAL/PLATELET
BASOS ABS: 0 10*3/uL (ref 0–0.1)
BASOS PCT: 1 %
Eosinophils Absolute: 0.1 10*3/uL (ref 0–0.7)
Eosinophils Relative: 3 %
HCT: 30.5 % — ABNORMAL LOW (ref 35.0–47.0)
Hemoglobin: 10.3 g/dL — ABNORMAL LOW (ref 12.0–16.0)
LYMPHS PCT: 40 %
Lymphs Abs: 1.1 10*3/uL (ref 1.0–3.6)
MCH: 28.6 pg (ref 26.0–34.0)
MCHC: 33.7 g/dL (ref 32.0–36.0)
MCV: 84.8 fL (ref 80.0–100.0)
Monocytes Absolute: 0.2 10*3/uL (ref 0.2–0.9)
Monocytes Relative: 8 %
NEUTROS ABS: 1.4 10*3/uL (ref 1.4–6.5)
Neutrophils Relative %: 48 %
Platelets: 213 10*3/uL (ref 150–440)
RBC: 3.6 MIL/uL — AB (ref 3.80–5.20)
RDW: 21.3 % — AB (ref 11.5–14.5)
WBC: 2.9 10*3/uL — AB (ref 3.6–11.0)

## 2017-02-13 MED ORDER — SODIUM CHLORIDE 0.9 % IV SOLN
20.0000 mg | Freq: Once | INTRAVENOUS | Status: AC
  Administered 2017-02-13: 20 mg via INTRAVENOUS
  Filled 2017-02-13: qty 2

## 2017-02-13 MED ORDER — SODIUM CHLORIDE 0.9 % IV SOLN
Freq: Once | INTRAVENOUS | Status: AC
  Administered 2017-02-13: 09:00:00 via INTRAVENOUS
  Filled 2017-02-13: qty 1000

## 2017-02-13 MED ORDER — TRASTUZUMAB CHEMO 150 MG IV SOLR
2.0000 mg/kg | Freq: Once | INTRAVENOUS | Status: AC
  Administered 2017-02-13: 126 mg via INTRAVENOUS
  Filled 2017-02-13: qty 6

## 2017-02-13 MED ORDER — FAMOTIDINE IN NACL 20-0.9 MG/50ML-% IV SOLN
20.0000 mg | Freq: Once | INTRAVENOUS | Status: AC
  Administered 2017-02-13: 20 mg via INTRAVENOUS
  Filled 2017-02-13: qty 50

## 2017-02-13 MED ORDER — DIPHENHYDRAMINE HCL 50 MG/ML IJ SOLN
50.0000 mg | Freq: Once | INTRAMUSCULAR | Status: AC
  Administered 2017-02-13: 50 mg via INTRAVENOUS
  Filled 2017-02-13: qty 1

## 2017-02-13 MED ORDER — ACETAMINOPHEN 325 MG PO TABS
650.0000 mg | ORAL_TABLET | Freq: Once | ORAL | Status: AC
  Administered 2017-02-13: 650 mg via ORAL
  Filled 2017-02-13: qty 2

## 2017-02-13 MED ORDER — SODIUM CHLORIDE 0.9 % IV SOLN
80.0000 mg/m2 | Freq: Once | INTRAVENOUS | Status: AC
  Administered 2017-02-13: 138 mg via INTRAVENOUS
  Filled 2017-02-13: qty 23

## 2017-02-13 MED ORDER — HEPARIN SOD (PORK) LOCK FLUSH 100 UNIT/ML IV SOLN
500.0000 [IU] | Freq: Once | INTRAVENOUS | Status: AC | PRN
  Administered 2017-02-13: 500 [IU]
  Filled 2017-02-13: qty 5

## 2017-02-20 ENCOUNTER — Inpatient Hospital Stay: Payer: Medicare Other | Attending: Internal Medicine

## 2017-02-20 ENCOUNTER — Inpatient Hospital Stay: Payer: Medicare Other

## 2017-02-20 VITALS — BP 156/71 | HR 96 | Temp 97.7°F | Resp 18

## 2017-02-20 DIAGNOSIS — I10 Essential (primary) hypertension: Secondary | ICD-10-CM | POA: Diagnosis not present

## 2017-02-20 DIAGNOSIS — C50412 Malignant neoplasm of upper-outer quadrant of left female breast: Secondary | ICD-10-CM

## 2017-02-20 DIAGNOSIS — Z7982 Long term (current) use of aspirin: Secondary | ICD-10-CM | POA: Insufficient documentation

## 2017-02-20 DIAGNOSIS — Z8041 Family history of malignant neoplasm of ovary: Secondary | ICD-10-CM | POA: Insufficient documentation

## 2017-02-20 DIAGNOSIS — Z7984 Long term (current) use of oral hypoglycemic drugs: Secondary | ICD-10-CM | POA: Insufficient documentation

## 2017-02-20 DIAGNOSIS — R21 Rash and other nonspecific skin eruption: Secondary | ICD-10-CM | POA: Diagnosis not present

## 2017-02-20 DIAGNOSIS — E785 Hyperlipidemia, unspecified: Secondary | ICD-10-CM | POA: Insufficient documentation

## 2017-02-20 DIAGNOSIS — Z5112 Encounter for antineoplastic immunotherapy: Secondary | ICD-10-CM | POA: Diagnosis not present

## 2017-02-20 DIAGNOSIS — Z5111 Encounter for antineoplastic chemotherapy: Secondary | ICD-10-CM | POA: Diagnosis not present

## 2017-02-20 DIAGNOSIS — E119 Type 2 diabetes mellitus without complications: Secondary | ICD-10-CM | POA: Diagnosis not present

## 2017-02-20 DIAGNOSIS — Z803 Family history of malignant neoplasm of breast: Secondary | ICD-10-CM | POA: Insufficient documentation

## 2017-02-20 DIAGNOSIS — Z170 Estrogen receptor positive status [ER+]: Secondary | ICD-10-CM | POA: Insufficient documentation

## 2017-02-20 DIAGNOSIS — Z800 Family history of malignant neoplasm of digestive organs: Secondary | ICD-10-CM | POA: Diagnosis not present

## 2017-02-20 DIAGNOSIS — C7951 Secondary malignant neoplasm of bone: Secondary | ICD-10-CM | POA: Insufficient documentation

## 2017-02-20 DIAGNOSIS — Z79899 Other long term (current) drug therapy: Secondary | ICD-10-CM | POA: Insufficient documentation

## 2017-02-20 LAB — CBC WITH DIFFERENTIAL/PLATELET
Basophils Absolute: 0.1 10*3/uL (ref 0–0.1)
Basophils Relative: 2 %
EOS PCT: 2 %
Eosinophils Absolute: 0.1 10*3/uL (ref 0–0.7)
HEMATOCRIT: 31.2 % — AB (ref 35.0–47.0)
HEMOGLOBIN: 10.3 g/dL — AB (ref 12.0–16.0)
LYMPHS ABS: 1.1 10*3/uL (ref 1.0–3.6)
LYMPHS PCT: 35 %
MCH: 28.4 pg (ref 26.0–34.0)
MCHC: 33.1 g/dL (ref 32.0–36.0)
MCV: 85.6 fL (ref 80.0–100.0)
Monocytes Absolute: 0.3 10*3/uL (ref 0.2–0.9)
Monocytes Relative: 11 %
Neutro Abs: 1.6 10*3/uL (ref 1.4–6.5)
Neutrophils Relative %: 50 %
PLATELETS: 205 10*3/uL (ref 150–440)
RBC: 3.64 MIL/uL — AB (ref 3.80–5.20)
RDW: 21.9 % — ABNORMAL HIGH (ref 11.5–14.5)
WBC: 3.2 10*3/uL — AB (ref 3.6–11.0)

## 2017-02-20 MED ORDER — FAMOTIDINE IN NACL 20-0.9 MG/50ML-% IV SOLN
20.0000 mg | Freq: Once | INTRAVENOUS | Status: AC
Start: 2017-02-20 — End: 2017-02-20
  Administered 2017-02-20: 20 mg via INTRAVENOUS
  Filled 2017-02-20: qty 50

## 2017-02-20 MED ORDER — TRASTUZUMAB CHEMO 150 MG IV SOLR
2.0000 mg/kg | Freq: Once | INTRAVENOUS | Status: AC
  Administered 2017-02-20: 126 mg via INTRAVENOUS
  Filled 2017-02-20: qty 6

## 2017-02-20 MED ORDER — ACETAMINOPHEN 325 MG PO TABS
650.0000 mg | ORAL_TABLET | Freq: Once | ORAL | Status: AC
  Administered 2017-02-20: 650 mg via ORAL
  Filled 2017-02-20: qty 2

## 2017-02-20 MED ORDER — DEXAMETHASONE SODIUM PHOSPHATE 100 MG/10ML IJ SOLN
20.0000 mg | Freq: Once | INTRAMUSCULAR | Status: AC
  Administered 2017-02-20: 20 mg via INTRAVENOUS
  Filled 2017-02-20: qty 2

## 2017-02-20 MED ORDER — HEPARIN SOD (PORK) LOCK FLUSH 100 UNIT/ML IV SOLN
500.0000 [IU] | Freq: Once | INTRAVENOUS | Status: AC | PRN
  Administered 2017-02-20: 500 [IU]
  Filled 2017-02-20: qty 5

## 2017-02-20 MED ORDER — SODIUM CHLORIDE 0.9 % IV SOLN
Freq: Once | INTRAVENOUS | Status: AC
  Administered 2017-02-20: 09:00:00 via INTRAVENOUS
  Filled 2017-02-20: qty 1000

## 2017-02-20 MED ORDER — PACLITAXEL CHEMO INJECTION 300 MG/50ML
80.0000 mg/m2 | Freq: Once | INTRAVENOUS | Status: AC
  Administered 2017-02-20: 138 mg via INTRAVENOUS
  Filled 2017-02-20: qty 23

## 2017-02-20 MED ORDER — DIPHENHYDRAMINE HCL 50 MG/ML IJ SOLN
50.0000 mg | Freq: Once | INTRAMUSCULAR | Status: AC
  Administered 2017-02-20: 50 mg via INTRAVENOUS
  Filled 2017-02-20: qty 1

## 2017-02-20 NOTE — Progress Notes (Unsigned)
Per Dr. Rogue Bussing, proceed with treatment today without CMP.

## 2017-02-25 ENCOUNTER — Other Ambulatory Visit: Payer: Self-pay | Admitting: *Deleted

## 2017-02-25 DIAGNOSIS — Z170 Estrogen receptor positive status [ER+]: Principal | ICD-10-CM

## 2017-02-25 DIAGNOSIS — C50412 Malignant neoplasm of upper-outer quadrant of left female breast: Secondary | ICD-10-CM

## 2017-02-27 ENCOUNTER — Inpatient Hospital Stay: Payer: Medicare Other

## 2017-02-27 ENCOUNTER — Inpatient Hospital Stay (HOSPITAL_BASED_OUTPATIENT_CLINIC_OR_DEPARTMENT_OTHER): Payer: Medicare Other | Admitting: Internal Medicine

## 2017-02-27 VITALS — BP 138/75 | HR 114 | Temp 97.1°F | Resp 18 | Wt 130.1 lb

## 2017-02-27 DIAGNOSIS — Z800 Family history of malignant neoplasm of digestive organs: Secondary | ICD-10-CM

## 2017-02-27 DIAGNOSIS — Z170 Estrogen receptor positive status [ER+]: Secondary | ICD-10-CM

## 2017-02-27 DIAGNOSIS — Z79899 Other long term (current) drug therapy: Secondary | ICD-10-CM | POA: Diagnosis not present

## 2017-02-27 DIAGNOSIS — I10 Essential (primary) hypertension: Secondary | ICD-10-CM | POA: Diagnosis not present

## 2017-02-27 DIAGNOSIS — Z8041 Family history of malignant neoplasm of ovary: Secondary | ICD-10-CM | POA: Diagnosis not present

## 2017-02-27 DIAGNOSIS — E785 Hyperlipidemia, unspecified: Secondary | ICD-10-CM | POA: Diagnosis not present

## 2017-02-27 DIAGNOSIS — C50612 Malignant neoplasm of axillary tail of left female breast: Secondary | ICD-10-CM

## 2017-02-27 DIAGNOSIS — C7951 Secondary malignant neoplasm of bone: Secondary | ICD-10-CM

## 2017-02-27 DIAGNOSIS — C50412 Malignant neoplasm of upper-outer quadrant of left female breast: Secondary | ICD-10-CM

## 2017-02-27 DIAGNOSIS — R21 Rash and other nonspecific skin eruption: Secondary | ICD-10-CM

## 2017-02-27 DIAGNOSIS — Z7984 Long term (current) use of oral hypoglycemic drugs: Secondary | ICD-10-CM

## 2017-02-27 DIAGNOSIS — Z803 Family history of malignant neoplasm of breast: Secondary | ICD-10-CM

## 2017-02-27 DIAGNOSIS — Z7982 Long term (current) use of aspirin: Secondary | ICD-10-CM | POA: Diagnosis not present

## 2017-02-27 DIAGNOSIS — C50611 Malignant neoplasm of axillary tail of right female breast: Secondary | ICD-10-CM

## 2017-02-27 DIAGNOSIS — E119 Type 2 diabetes mellitus without complications: Secondary | ICD-10-CM

## 2017-02-27 DIAGNOSIS — Z5112 Encounter for antineoplastic immunotherapy: Secondary | ICD-10-CM | POA: Diagnosis not present

## 2017-02-27 DIAGNOSIS — Z5111 Encounter for antineoplastic chemotherapy: Secondary | ICD-10-CM | POA: Diagnosis not present

## 2017-02-27 LAB — COMPREHENSIVE METABOLIC PANEL
ALT: 27 U/L (ref 14–54)
AST: 44 U/L — AB (ref 15–41)
Albumin: 3.8 g/dL (ref 3.5–5.0)
Alkaline Phosphatase: 113 U/L (ref 38–126)
Anion gap: 8 (ref 5–15)
BUN: 10 mg/dL (ref 6–20)
CHLORIDE: 106 mmol/L (ref 101–111)
CO2: 24 mmol/L (ref 22–32)
CREATININE: 0.77 mg/dL (ref 0.44–1.00)
Calcium: 9.2 mg/dL (ref 8.9–10.3)
Glucose, Bld: 246 mg/dL — ABNORMAL HIGH (ref 65–99)
POTASSIUM: 3.1 mmol/L — AB (ref 3.5–5.1)
SODIUM: 138 mmol/L (ref 135–145)
Total Bilirubin: 0.7 mg/dL (ref 0.3–1.2)
Total Protein: 7 g/dL (ref 6.5–8.1)

## 2017-02-27 LAB — CBC WITH DIFFERENTIAL/PLATELET
BASOS ABS: 0.1 10*3/uL (ref 0–0.1)
Basophils Relative: 1 %
EOS ABS: 0.1 10*3/uL (ref 0–0.7)
EOS PCT: 3 %
HCT: 29.9 % — ABNORMAL LOW (ref 35.0–47.0)
Hemoglobin: 10 g/dL — ABNORMAL LOW (ref 12.0–16.0)
Lymphocytes Relative: 36 %
Lymphs Abs: 1.5 10*3/uL (ref 1.0–3.6)
MCH: 28.7 pg (ref 26.0–34.0)
MCHC: 33.6 g/dL (ref 32.0–36.0)
MCV: 85.4 fL (ref 80.0–100.0)
MONO ABS: 0.3 10*3/uL (ref 0.2–0.9)
Monocytes Relative: 7 %
Neutro Abs: 2.1 10*3/uL (ref 1.4–6.5)
Neutrophils Relative %: 53 %
Platelets: 212 10*3/uL (ref 150–440)
RBC: 3.5 MIL/uL — AB (ref 3.80–5.20)
RDW: 21.7 % — ABNORMAL HIGH (ref 11.5–14.5)
WBC: 4.1 10*3/uL (ref 3.6–11.0)

## 2017-02-27 MED ORDER — HEPARIN SOD (PORK) LOCK FLUSH 100 UNIT/ML IV SOLN
500.0000 [IU] | Freq: Once | INTRAVENOUS | Status: AC
  Administered 2017-02-27: 500 [IU] via INTRAVENOUS
  Filled 2017-02-27: qty 5

## 2017-02-27 MED ORDER — HEPARIN SOD (PORK) LOCK FLUSH 100 UNIT/ML IV SOLN: 500.0000 [IU] | Freq: Once | INTRAVENOUS | Status: DC | PRN

## 2017-02-27 MED ORDER — ACETAMINOPHEN 325 MG PO TABS
650.0000 mg | ORAL_TABLET | Freq: Once | ORAL | Status: AC
  Administered 2017-02-27: 650 mg via ORAL
  Filled 2017-02-27: qty 2

## 2017-02-27 MED ORDER — SODIUM CHLORIDE 0.9 % IV SOLN
Freq: Once | INTRAVENOUS | Status: AC
Start: 2017-02-27 — End: 2017-02-27
  Administered 2017-02-27: 10:00:00 via INTRAVENOUS
  Filled 2017-02-27: qty 1000

## 2017-02-27 MED ORDER — PACLITAXEL CHEMO INJECTION 300 MG/50ML
80.0000 mg/m2 | Freq: Once | INTRAVENOUS | Status: AC
  Administered 2017-02-27: 138 mg via INTRAVENOUS
  Filled 2017-02-27: qty 23

## 2017-02-27 MED ORDER — SODIUM CHLORIDE 0.9% FLUSH
10.0000 mL | INTRAVENOUS | Status: DC | PRN
  Administered 2017-02-27: 10 mL via INTRAVENOUS
  Filled 2017-02-27: qty 10

## 2017-02-27 MED ORDER — DIPHENHYDRAMINE HCL 50 MG/ML IJ SOLN
50.0000 mg | Freq: Once | INTRAMUSCULAR | Status: AC
  Administered 2017-02-27: 50 mg via INTRAVENOUS
  Filled 2017-02-27: qty 1

## 2017-02-27 MED ORDER — DENOSUMAB 120 MG/1.7ML ~~LOC~~ SOLN
120.0000 mg | Freq: Once | SUBCUTANEOUS | Status: AC
  Administered 2017-02-27: 120 mg via SUBCUTANEOUS
  Filled 2017-02-27: qty 1.7

## 2017-02-27 MED ORDER — SODIUM CHLORIDE 0.9 % IV SOLN
2.0000 mg/kg | Freq: Once | INTRAVENOUS | Status: AC
  Administered 2017-02-27: 126 mg via INTRAVENOUS
  Filled 2017-02-27: qty 6

## 2017-02-27 MED ORDER — SODIUM CHLORIDE 0.9% FLUSH
10.0000 mL | INTRAVENOUS | Status: DC | PRN
  Administered 2017-02-27: 10 mL
  Filled 2017-02-27: qty 10

## 2017-02-27 MED ORDER — SODIUM CHLORIDE 0.9 % IV SOLN
20.0000 mg | Freq: Once | INTRAVENOUS | Status: AC
  Administered 2017-02-27: 20 mg via INTRAVENOUS
  Filled 2017-02-27: qty 2

## 2017-02-27 MED ORDER — FAMOTIDINE IN NACL 20-0.9 MG/50ML-% IV SOLN
20.0000 mg | Freq: Once | INTRAVENOUS | Status: AC
  Administered 2017-02-27: 20 mg via INTRAVENOUS
  Filled 2017-02-27: qty 50

## 2017-02-27 NOTE — Progress Notes (Signed)
Patient here today for follow up. Patient has a rash on both arms.

## 2017-02-27 NOTE — Assessment & Plan Note (Addendum)
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 On taxol- Herceptin cycle weekly. JAN 5th 2017- CT scan - significant improvement.  Clinically no progression of disease noted.  # proceed with cycle #6 day 8 today. Tolerating chemo well; jan 2018- MUGA scan- 63%. Labs today reviewed;  acceptable for treatment today.   # Bone metastases on Xgeva; today.   # Skin rash- non itchy; recommend benadryl/ hydrocortisone prn.   # weekly/ chemo/labs; follow up in 3 weeks/MD/chemo/ CT scan prior.

## 2017-02-27 NOTE — Progress Notes (Signed)
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  Estrogen receptor 90%.  Progesterone receptor 60%.  Patient had lumpectomy and MammoSite partial breast radiation therapy followed by tamoxifen for 5 years.  Patient is still taking tamoxifen.. 2 recent mammogram in December of 2015 negative.  Further evaluation by surgeon revealed a palpable mass in the left upper outer quadrant near axilla.  Ultrasound revealed one small normal-appearing lymph node 0.6 cm in lower limit of axilla.  Adjacent to this there was ill-defined multilobulated hypoechoic mass 1.8 x 2.6 cm.  Biopsy of the left axillary mass was done which was positive for high-grade carcinoma.   2,estrogen receptor positive.  Progesterone receptor positive.  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 3.  MRI scan of breast (January, 2015) reveals multiple abnormal enlarged left axillary lymph node There are 2 oval some circumscribed mass at 2:30 and 3:00 position of the left breast.  0.6 cm x 1.3 cm and 4 x 7 mm Non-mass enhancement extending 2.4 cm these masses is 3.6 cm mass.  Findings suggestive of several pulmonary nodules.  PET  scan shows extensive disease, 4.started on Taxotere,perjeta, Herceptin(January 03, 2015- June 2016.   # June 2016- Herceptin [Letrozole + Ibrance] until May 2017.   # MAY 2017- PET PROGRESSION; # July 2017- START KADCYLA  x4 cycles- SEP 15th 2017CT- PROGRESSION  # SEP 2017- START Taxol + Herceptin.   # Right sided Pleural effusion [s/p Thora Sep  2017]  # Bone mets- X-geva q 6W  # MUGA Jan 18th-63%     Carcinoma of upper-outer quadrant of left breast in female, estrogen receptor positive ()   09/28/2016 Initial Diagnosis    Carcinoma of upper-outer quadrant of left breast in female, estrogen receptor positive ([place])       INTERVAL HISTORY:  Tammy Pope 71 y.o.  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  She continues to deny any shortness of breath. Denies any cough. Appetite is good. She is fairly active. No headaches. Denies any tingling or numbness. Patient has a mild rash on her right upper extremity. Non-itchy. Denies any jaw pain. No swelling in the legs.   REVIEW OF SYSTEMS:  A complete 10 point review of system is done which is negative except mentioned above/history of present illness.   PAST MEDICAL HISTORY :  Past Medical History:  Diagnosis Date  . Axillary mass 12/15/2014  . Breast cancer ([place]) 02/21/2009  . Cancer [facility]) 2010   right breast ductal carcinoma in situ  . Diabetes mellitus without complication ([place])   . Floaters 2014  . Hyperlipidemia 1990  . Hypertension 2009  . Malignant neoplasm of axillary tail of female breast ([place]) 12/16/2014  . Malignant neoplasm of upper-outer quadrant of female breast ([place]) 02/21/2009   DCIS of the right breast, intermediate grade, resected the negative margins. ER 90%, PR 50%, MammoSite partial breast radiation  . Neoplasm of  uncertain behavior of connective and other soft tissue   . Special screening for malignant neoplasms, colon     PAST SURGICAL HISTORY :   Past Surgical History:  Procedure Laterality Date  . APPENDECTOMY  1956  . BREAST BIOPSY Right 2010   stereo  . BREAST SURGERY Right 2010   wide excision  . COLONOSCOPY  2008   ??? Md  . left axilla biopsy Left 12-14-14  . PARTIAL HYSTERECTOMY  1977     FAMILY HISTORY :   Family History  Problem Relation Age of Onset  . Cancer Other     unknown family members with ovarian,colon,breast cancers  . Hypertension Mother   . Heart disease Daughter     MI    SOCIAL HISTORY:   Social History  Substance Use Topics  . Smoking status: Never Smoker  . Smokeless tobacco: Never Used  . Alcohol use No    ALLERGIES:  is allergic to penicillin g and sulfa antibiotics.  MEDICATIONS:  Current Outpatient Prescriptions  Medication Sig Dispense Refill  . ALPRAZolam (XANAX) 0.5 MG tablet Take 1 tablet (0.5 mg total) by mouth 3 (three) times daily as needed. 90 tablet 2  . amLODipine (NORVASC) 5 MG tablet Take 1 tablet (5 mg total) by mouth daily. 90 tablet 3  . aspirin 81 MG tablet Take 81 mg by mouth daily.    . Calcium 600-200 MG-UNIT tablet Take 1 tablet by mouth 2 (two) times daily. 120 tablet 3  . metFORMIN (GLUCOPHAGE) 500 MG tablet Take 2 tablets (1,000 mg total) by mouth 2 (two) times daily with a meal. 360 tablet 5  . Multiple Vitamins-Minerals (MULTIVITAMIN ADULT PO) Take by mouth.    . ONE TOUCH ULTRA TEST test strip     . potassium chloride SA (KLOR-CON M20) 20 MEQ tablet Take 2 tablets (40 mEq total) by mouth daily. 60 tablet 3  . rosuvastatin (CRESTOR) 5 MG tablet Take 1 tablet (5 mg total) by mouth at bedtime. 90 tablet 1  . valsartan-hydrochlorothiazide (DIOVAN-HCT) 320-25 MG tablet Take 1 tablet by mouth daily. 90 tablet 3   No current facility-administered medications for this visit.    Facility-Administered Medications Ordered in Other Visits  Medication Dose Route Frequency Provider Last Rate Last Dose  . heparin lock flush 100 unit/mL  250 Units Intracatheter PRN Forest Gleason, MD      . heparin lock flush 100 unit/mL  500 Units Intracatheter Once PRN Cammie Sickle, MD      . sodium chloride 0.9 % 250 mL with potassium chloride 40 mEq infusion   Intravenous Continuous Forest Gleason, MD   Stopped at 05/03/15 1229  .  sodium chloride 0.9 % injection 10 mL  10 mL Intracatheter PRN Forest Gleason, MD   10 mL at 04/18/15 1545  . sodium chloride flush (NS) 0.9 % injection 10 mL  10 mL Intravenous PRN Cammie Sickle, MD   10 mL at 02/27/17 0830  . sodium chloride flush (NS) 0.9 % injection 10 mL  10 mL Intracatheter PRN Cammie Sickle, MD   10 mL at 02/27/17 1226    PHYSICAL EXAMINATION: ECOG PERFORMANCE STATUS: 0 - Asymptomatic  BP 138/75 (BP Location: Left Arm, Patient Position: Sitting)   Pulse (!) 114   Temp 97.1 F (36.2 C) (Tympanic)   Resp 18   Wt 130 lb 2 oz (59 kg)   LMP  (LMP Unknown)   BMI 23.05 kg/m   Filed Weights   02/27/17  1610  Weight: 130 lb 2 oz (59 kg)    GENERAL: Well-nourished well-developed; Alert, no distress and comfortable.   Accompanied by her husband. EYES: no pallor or icterus OROPHARYNX: no thrush or ulceration; poor dentition  NECK: supple, no masses felt LYMPH:  no palpable lymphadenopathy in the cervical, axillary or inguinal regions LUNGS: clear to auscultation and  No wheeze or crackles HEART/CVS: regular rate & rhythm and no murmurs; No lower extremity edema ABDOMEN:abdomen soft, non-tender and normal bowel sounds Musculoskeletal:no cyanosis of digits and no clubbing  PSYCH: alert & oriented x 3 with fluent speech NEURO: no focal motor/sensory deficits SKIN:  no rashes or significant lesions LEFT BREAST- 1-2cm mass left upper outer quadrant/axillary tail. Nontender. Skin: macular rash noted on the bilateral extremities  LABORATORY DATA:  I have reviewed the data as listed    Component Value Date/Time   NA 138 02/27/2017 0828   NA 136 09/16/2015 0843   NA 138 03/28/2015 0923   K 3.1 (L) 02/27/2017 0828   K 3.3 (L) 03/28/2015 0923   CL 106 02/27/2017 0828   CL 101 03/28/2015 0923   CO2 24 02/27/2017 0828   CO2 28 03/28/2015 0923   GLUCOSE 246 (H) 02/27/2017 0828   GLUCOSE 173 (H) 03/28/2015 0923   BUN 10 02/27/2017 0828   BUN 10 09/16/2015  0843   BUN 9 03/28/2015 0923   CREATININE 0.77 02/27/2017 0828   CREATININE 0.56 03/28/2015 0923   CALCIUM 9.2 02/27/2017 0828   CALCIUM 8.6 (L) 03/28/2015 0923   PROT 7.0 02/27/2017 0828   PROT 7.2 09/16/2015 0843   PROT 7.1 03/28/2015 0923   ALBUMIN 3.8 02/27/2017 0828   ALBUMIN 4.6 09/16/2015 0843   ALBUMIN 3.6 03/28/2015 0923   AST 44 (H) 02/27/2017 0828   AST 43 (H) 03/28/2015 0923   ALT 27 02/27/2017 0828   ALT 26 03/28/2015 0923   ALKPHOS 113 02/27/2017 0828   ALKPHOS 85 03/28/2015 0923   BILITOT 0.7 02/27/2017 0828   BILITOT 0.9 09/16/2015 0843   BILITOT 0.6 03/28/2015 0923   GFRNONAA >60 02/27/2017 0828   GFRNONAA >60 03/28/2015 0923   GFRAA >60 02/27/2017 0828   GFRAA >60 03/28/2015 0923    No results found for: SPEP, UPEP  Lab Results  Component Value Date   WBC 4.1 02/27/2017   NEUTROABS 2.1 02/27/2017   HGB 10.0 (L) 02/27/2017   HCT 29.9 (L) 02/27/2017   MCV 85.4 02/27/2017   PLT 212 02/27/2017      Chemistry      Component Value Date/Time   NA 138 02/27/2017 0828   NA 136 09/16/2015 0843   NA 138 03/28/2015 0923   K 3.1 (L) 02/27/2017 0828   K 3.3 (L) 03/28/2015 0923   CL 106 02/27/2017 0828   CL 101 03/28/2015 0923   CO2 24 02/27/2017 0828   CO2 28 03/28/2015 0923   BUN 10 02/27/2017 0828   BUN 10 09/16/2015 0843   BUN 9 03/28/2015 0923   CREATININE 0.77 02/27/2017 0828   CREATININE 0.56 03/28/2015 0923      Component Value Date/Time   CALCIUM 9.2 02/27/2017 0828   CALCIUM 8.6 (L) 03/28/2015 0923   ALKPHOS 113 02/27/2017 0828   ALKPHOS 85 03/28/2015 0923   AST 44 (H) 02/27/2017 0828   AST 43 (H) 03/28/2015 0923   ALT 27 02/27/2017 0828   ALT 26 03/28/2015 0923   BILITOT 0.7 02/27/2017 0828   BILITOT 0.9 09/16/2015 9604  BILITOT 0.6 03/28/2015 0923     IMPRESSION: 1. Overall, interval response to therapy. 2. Resolution of right pleural effusion with significant interval appearance of pleural disease involving the right lung.  No measurable pleural tumor identified at this time 3. Significant interval improvement in the appearance of left lateral breast lesion and left ventral chest wall lesion. 4. Morphologic features of the liver compatible with cirrhosis and mild portal venous hypertension with small esophageal varices. 5. Stable appearance of diffuse sclerotic bone metastases. 6. Peripheral and basilar predominant interstitial reticulation is noted affecting both lungs, suggestive of chronic interstitial lung disease.   Electronically Signed   By: Kerby Moors M.D.   On: 12/21/2016 12:03RADIOGRAPHIC STUDIES: I have personally reviewed the radiological images as listed and agreed with the findings in the report. No results found.   ASSESSMENT & PLAN:  Carcinoma of upper-outer quadrant of left breast in female, estrogen receptor positive ([place]) Metastatic breast cancer to the bone/lung. On taxol- Herceptin cycle weekly. JAN 5th 2017- CT scan - significant improvement.  Clinically no progression of disease noted.  # proceed with cycle #6 day 8 today. Tolerating chemo well; jan 2018- MUGA scan- 63%. Labs today reviewed;  acceptable for treatment today.   # Bone metastases on Xgeva; today.   # Skin rash- non itchy; recommend benadryl/ hydrocortisone prn.   # weekly/ chemo/labs; follow up in 3 weeks/MD/chemo/ CT scan prior.    Orders Placed This Encounter  Procedures  . CT ABDOMEN PELVIS W CONTRAST    Standing Status:   Future    Standing Expiration Date:   05/29/2018    Order Specific Question:   Reason for Exam (SYMPTOM  OR DIAGNOSIS REQUIRED)    Answer:   breast cancer    Order Specific Question:   Preferred imaging location?    Answer:   [facility]  . CT CHEST W CONTRAST    Standing Status:   Future    Standing Expiration Date:   04/29/2018    Order Specific Question:   Reason for Exam (SYMPTOM  OR DIAGNOSIS REQUIRED)    Answer:   breast cancer    Order Specific Question:   Preferred  imaging location?    Answer:   [facility]  . Comprehensive metabolic panel    Standing Status:   Future    Standing Expiration Date:   08/30/2017       Cammie Sickle, MD 02/27/2017 1:07 PM

## 2017-03-06 ENCOUNTER — Inpatient Hospital Stay: Payer: Medicare Other

## 2017-03-07 ENCOUNTER — Inpatient Hospital Stay: Payer: Medicare Other

## 2017-03-07 DIAGNOSIS — Z5112 Encounter for antineoplastic immunotherapy: Secondary | ICD-10-CM | POA: Diagnosis not present

## 2017-03-07 DIAGNOSIS — C50412 Malignant neoplasm of upper-outer quadrant of left female breast: Secondary | ICD-10-CM | POA: Diagnosis not present

## 2017-03-07 DIAGNOSIS — R21 Rash and other nonspecific skin eruption: Secondary | ICD-10-CM | POA: Diagnosis not present

## 2017-03-07 DIAGNOSIS — Z5111 Encounter for antineoplastic chemotherapy: Secondary | ICD-10-CM | POA: Diagnosis not present

## 2017-03-07 DIAGNOSIS — C7951 Secondary malignant neoplasm of bone: Secondary | ICD-10-CM | POA: Diagnosis not present

## 2017-03-07 DIAGNOSIS — Z170 Estrogen receptor positive status [ER+]: Secondary | ICD-10-CM | POA: Diagnosis not present

## 2017-03-07 LAB — COMPREHENSIVE METABOLIC PANEL
ALT: 25 U/L (ref 14–54)
AST: 39 U/L (ref 15–41)
Albumin: 3.6 g/dL (ref 3.5–5.0)
Alkaline Phosphatase: 116 U/L (ref 38–126)
Anion gap: 8 (ref 5–15)
BILIRUBIN TOTAL: 0.6 mg/dL (ref 0.3–1.2)
BUN: 8 mg/dL (ref 6–20)
CHLORIDE: 105 mmol/L (ref 101–111)
CO2: 24 mmol/L (ref 22–32)
Calcium: 9 mg/dL (ref 8.9–10.3)
Creatinine, Ser: 0.75 mg/dL (ref 0.44–1.00)
Glucose, Bld: 240 mg/dL — ABNORMAL HIGH (ref 65–99)
POTASSIUM: 3.7 mmol/L (ref 3.5–5.1)
Sodium: 137 mmol/L (ref 135–145)
TOTAL PROTEIN: 6.9 g/dL (ref 6.5–8.1)

## 2017-03-07 LAB — CBC WITH DIFFERENTIAL/PLATELET
Basophils Absolute: 0 10*3/uL (ref 0–0.1)
Basophils Relative: 1 %
EOS PCT: 2 %
Eosinophils Absolute: 0.1 10*3/uL (ref 0–0.7)
HEMATOCRIT: 29.1 % — AB (ref 35.0–47.0)
Hemoglobin: 9.8 g/dL — ABNORMAL LOW (ref 12.0–16.0)
LYMPHS ABS: 1.1 10*3/uL (ref 1.0–3.6)
LYMPHS PCT: 34 %
MCH: 28.6 pg (ref 26.0–34.0)
MCHC: 33.6 g/dL (ref 32.0–36.0)
MCV: 85.1 fL (ref 80.0–100.0)
MONO ABS: 0.3 10*3/uL (ref 0.2–0.9)
MONOS PCT: 10 %
NEUTROS ABS: 1.8 10*3/uL (ref 1.4–6.5)
Neutrophils Relative %: 53 %
PLATELETS: 211 10*3/uL (ref 150–440)
RBC: 3.42 MIL/uL — ABNORMAL LOW (ref 3.80–5.20)
RDW: 22.9 % — AB (ref 11.5–14.5)
WBC: 3.4 10*3/uL — ABNORMAL LOW (ref 3.6–11.0)

## 2017-03-07 MED ORDER — ACETAMINOPHEN 325 MG PO TABS
650.0000 mg | ORAL_TABLET | Freq: Once | ORAL | Status: AC
  Administered 2017-03-07: 650 mg via ORAL
  Filled 2017-03-07: qty 2

## 2017-03-07 MED ORDER — FAMOTIDINE IN NACL 20-0.9 MG/50ML-% IV SOLN
20.0000 mg | Freq: Once | INTRAVENOUS | Status: AC
  Administered 2017-03-07: 20 mg via INTRAVENOUS
  Filled 2017-03-07: qty 50

## 2017-03-07 MED ORDER — HEPARIN SOD (PORK) LOCK FLUSH 100 UNIT/ML IV SOLN
500.0000 [IU] | Freq: Once | INTRAVENOUS | Status: AC
  Administered 2017-03-07: 500 [IU] via INTRAVENOUS
  Filled 2017-03-07: qty 5

## 2017-03-07 MED ORDER — SODIUM CHLORIDE 0.9 % IV SOLN
20.0000 mg | Freq: Once | INTRAVENOUS | Status: AC
  Administered 2017-03-07: 20 mg via INTRAVENOUS
  Filled 2017-03-07: qty 2

## 2017-03-07 MED ORDER — DIPHENHYDRAMINE HCL 50 MG/ML IJ SOLN
50.0000 mg | Freq: Once | INTRAMUSCULAR | Status: AC
  Administered 2017-03-07: 50 mg via INTRAVENOUS
  Filled 2017-03-07: qty 1

## 2017-03-07 MED ORDER — HEPARIN SOD (PORK) LOCK FLUSH 100 UNIT/ML IV SOLN: 500.0000 [IU] | Freq: Once | INTRAVENOUS | Status: DC | PRN

## 2017-03-07 MED ORDER — SODIUM CHLORIDE 0.9 % IV SOLN
80.0000 mg/m2 | Freq: Once | INTRAVENOUS | Status: AC
  Administered 2017-03-07: 138 mg via INTRAVENOUS
  Filled 2017-03-07: qty 23

## 2017-03-07 MED ORDER — TRASTUZUMAB CHEMO 150 MG IV SOLR
2.0000 mg/kg | Freq: Once | INTRAVENOUS | Status: AC
  Administered 2017-03-07: 126 mg via INTRAVENOUS
  Filled 2017-03-07: qty 6

## 2017-03-07 MED ORDER — SODIUM CHLORIDE 0.9% FLUSH
10.0000 mL | INTRAVENOUS | Status: DC | PRN
  Administered 2017-03-07: 10 mL via INTRAVENOUS
  Filled 2017-03-07: qty 10

## 2017-03-07 MED ORDER — SODIUM CHLORIDE 0.9 % IV SOLN
Freq: Once | INTRAVENOUS | Status: AC
  Administered 2017-03-07: 10:00:00 via INTRAVENOUS
  Filled 2017-03-07: qty 1000

## 2017-03-11 ENCOUNTER — Ambulatory Visit
Admission: RE | Admit: 2017-03-11 | Discharge: 2017-03-11 | Disposition: A | Discharge status: Home / Self Care | Payer: Medicare Other | Source: Ambulatory Visit | Attending: Internal Medicine | Admitting: Internal Medicine

## 2017-03-11 DIAGNOSIS — C7951 Secondary malignant neoplasm of bone: Secondary | ICD-10-CM | POA: Diagnosis not present

## 2017-03-11 DIAGNOSIS — I700 Atherosclerosis of aorta: Secondary | ICD-10-CM | POA: Diagnosis not present

## 2017-03-11 DIAGNOSIS — R918 Other nonspecific abnormal finding of lung field: Secondary | ICD-10-CM | POA: Diagnosis not present

## 2017-03-11 DIAGNOSIS — R161 Splenomegaly, not elsewhere classified: Secondary | ICD-10-CM | POA: Insufficient documentation

## 2017-03-11 DIAGNOSIS — C50412 Malignant neoplasm of upper-outer quadrant of left female breast: Secondary | ICD-10-CM | POA: Diagnosis not present

## 2017-03-11 DIAGNOSIS — Z170 Estrogen receptor positive status [ER+]: Secondary | ICD-10-CM | POA: Diagnosis not present

## 2017-03-11 DIAGNOSIS — K7689 Other specified diseases of liver: Secondary | ICD-10-CM | POA: Diagnosis not present

## 2017-03-11 DIAGNOSIS — I2510 Atherosclerotic heart disease of native coronary artery without angina pectoris: Secondary | ICD-10-CM | POA: Diagnosis not present

## 2017-03-11 MED ORDER — IOPAMIDOL (ISOVUE-300) INJECTION 61%
100.0000 mL | Freq: Once | INTRAVENOUS | Status: AC | PRN
  Administered 2017-03-11: 100 mL via INTRAVENOUS

## 2017-03-19 ENCOUNTER — Other Ambulatory Visit: Payer: Self-pay | Admitting: *Deleted

## 2017-03-19 DIAGNOSIS — Z170 Estrogen receptor positive status [ER+]: Principal | ICD-10-CM

## 2017-03-19 DIAGNOSIS — C50412 Malignant neoplasm of upper-outer quadrant of left female breast: Secondary | ICD-10-CM

## 2017-03-20 ENCOUNTER — Inpatient Hospital Stay: Payer: Medicare Other

## 2017-03-20 ENCOUNTER — Inpatient Hospital Stay (HOSPITAL_BASED_OUTPATIENT_CLINIC_OR_DEPARTMENT_OTHER): Payer: Medicare Other | Admitting: Internal Medicine

## 2017-03-20 ENCOUNTER — Inpatient Hospital Stay: Payer: Medicare Other | Attending: Internal Medicine

## 2017-03-20 VITALS — BP 166/84 | HR 108 | Temp 97.4°F | Resp 18 | Wt 129.4 lb

## 2017-03-20 DIAGNOSIS — Z923 Personal history of irradiation: Secondary | ICD-10-CM | POA: Diagnosis not present

## 2017-03-20 DIAGNOSIS — C7951 Secondary malignant neoplasm of bone: Secondary | ICD-10-CM | POA: Insufficient documentation

## 2017-03-20 DIAGNOSIS — I10 Essential (primary) hypertension: Secondary | ICD-10-CM | POA: Insufficient documentation

## 2017-03-20 DIAGNOSIS — Z7982 Long term (current) use of aspirin: Secondary | ICD-10-CM | POA: Diagnosis not present

## 2017-03-20 DIAGNOSIS — Z7984 Long term (current) use of oral hypoglycemic drugs: Secondary | ICD-10-CM | POA: Insufficient documentation

## 2017-03-20 DIAGNOSIS — I700 Atherosclerosis of aorta: Secondary | ICD-10-CM | POA: Insufficient documentation

## 2017-03-20 DIAGNOSIS — Z8041 Family history of malignant neoplasm of ovary: Secondary | ICD-10-CM | POA: Diagnosis not present

## 2017-03-20 DIAGNOSIS — C50412 Malignant neoplasm of upper-outer quadrant of left female breast: Secondary | ICD-10-CM | POA: Diagnosis not present

## 2017-03-20 DIAGNOSIS — E785 Hyperlipidemia, unspecified: Secondary | ICD-10-CM | POA: Diagnosis not present

## 2017-03-20 DIAGNOSIS — C50611 Malignant neoplasm of axillary tail of right female breast: Secondary | ICD-10-CM

## 2017-03-20 DIAGNOSIS — G629 Polyneuropathy, unspecified: Secondary | ICD-10-CM | POA: Diagnosis not present

## 2017-03-20 DIAGNOSIS — Z5112 Encounter for antineoplastic immunotherapy: Secondary | ICD-10-CM | POA: Insufficient documentation

## 2017-03-20 DIAGNOSIS — Z5111 Encounter for antineoplastic chemotherapy: Secondary | ICD-10-CM | POA: Insufficient documentation

## 2017-03-20 DIAGNOSIS — Z803 Family history of malignant neoplasm of breast: Secondary | ICD-10-CM

## 2017-03-20 DIAGNOSIS — Z79810 Long term (current) use of selective estrogen receptor modulators (SERMs): Secondary | ICD-10-CM | POA: Insufficient documentation

## 2017-03-20 DIAGNOSIS — I2510 Atherosclerotic heart disease of native coronary artery without angina pectoris: Secondary | ICD-10-CM | POA: Diagnosis not present

## 2017-03-20 DIAGNOSIS — Z79899 Other long term (current) drug therapy: Secondary | ICD-10-CM

## 2017-03-20 DIAGNOSIS — Z170 Estrogen receptor positive status [ER+]: Secondary | ICD-10-CM | POA: Insufficient documentation

## 2017-03-20 DIAGNOSIS — R21 Rash and other nonspecific skin eruption: Secondary | ICD-10-CM | POA: Diagnosis not present

## 2017-03-20 DIAGNOSIS — C50612 Malignant neoplasm of axillary tail of left female breast: Secondary | ICD-10-CM

## 2017-03-20 DIAGNOSIS — E119 Type 2 diabetes mellitus without complications: Secondary | ICD-10-CM | POA: Diagnosis not present

## 2017-03-20 DIAGNOSIS — I25810 Atherosclerosis of coronary artery bypass graft(s) without angina pectoris: Secondary | ICD-10-CM

## 2017-03-20 DIAGNOSIS — Z800 Family history of malignant neoplasm of digestive organs: Secondary | ICD-10-CM | POA: Diagnosis not present

## 2017-03-20 LAB — CBC WITH DIFFERENTIAL/PLATELET
BASOS PCT: 1 %
Basophils Absolute: 0.1 10*3/uL (ref 0–0.1)
EOS ABS: 0.1 10*3/uL (ref 0–0.7)
EOS PCT: 2 %
HCT: 32 % — ABNORMAL LOW (ref 35.0–47.0)
HEMOGLOBIN: 10.5 g/dL — AB (ref 12.0–16.0)
Lymphocytes Relative: 22 %
Lymphs Abs: 1.3 10*3/uL (ref 1.0–3.6)
MCH: 28 pg (ref 26.0–34.0)
MCHC: 32.9 g/dL (ref 32.0–36.0)
MCV: 85 fL (ref 80.0–100.0)
MONOS PCT: 12 %
Monocytes Absolute: 0.7 10*3/uL (ref 0.2–0.9)
NEUTROS PCT: 63 %
Neutro Abs: 3.6 10*3/uL (ref 1.4–6.5)
PLATELETS: 221 10*3/uL (ref 150–440)
RBC: 3.77 MIL/uL — ABNORMAL LOW (ref 3.80–5.20)
RDW: 22.2 % — ABNORMAL HIGH (ref 11.5–14.5)
WBC: 5.7 10*3/uL (ref 3.6–11.0)

## 2017-03-20 LAB — COMPREHENSIVE METABOLIC PANEL
ALBUMIN: 3.9 g/dL (ref 3.5–5.0)
ALK PHOS: 114 U/L (ref 38–126)
ALT: 20 U/L (ref 14–54)
ANION GAP: 7 (ref 5–15)
AST: 33 U/L (ref 15–41)
BUN: 9 mg/dL (ref 6–20)
CALCIUM: 9.1 mg/dL (ref 8.9–10.3)
CHLORIDE: 107 mmol/L (ref 101–111)
CO2: 26 mmol/L (ref 22–32)
Creatinine, Ser: 0.74 mg/dL (ref 0.44–1.00)
GFR calc Af Amer: >60 mL/min (ref >60)
GFR calc non Af Amer: >60 mL/min (ref >60)
GLUCOSE: 207 mg/dL — AB (ref 65–99)
POTASSIUM: 3.7 mmol/L (ref 3.5–5.1)
SODIUM: 140 mmol/L (ref 135–145)
Total Bilirubin: 0.8 mg/dL (ref 0.3–1.2)
Total Protein: 6.9 g/dL (ref 6.5–8.1)

## 2017-03-20 MED ORDER — FAMOTIDINE IN NACL 20-0.9 MG/50ML-% IV SOLN
20.0000 mg | Freq: Once | INTRAVENOUS | Status: AC
  Administered 2017-03-20: 20 mg via INTRAVENOUS
  Filled 2017-03-20: qty 50

## 2017-03-20 MED ORDER — HEPARIN SOD (PORK) LOCK FLUSH 100 UNIT/ML IV SOLN
500.0000 [IU] | Freq: Once | INTRAVENOUS | Status: AC
  Administered 2017-03-20: 500 [IU] via INTRAVENOUS
  Filled 2017-03-20: qty 5

## 2017-03-20 MED ORDER — SODIUM CHLORIDE 0.9 % IV SOLN
Freq: Once | INTRAVENOUS | Status: AC
  Administered 2017-03-20: 10:00:00 via INTRAVENOUS
  Filled 2017-03-20: qty 1000

## 2017-03-20 MED ORDER — DENOSUMAB 120 MG/1.7ML ~~LOC~~ SOLN
120.0000 mg | Freq: Once | SUBCUTANEOUS | Status: AC
  Administered 2017-03-20: 120 mg via SUBCUTANEOUS
  Filled 2017-03-20: qty 1.7

## 2017-03-20 MED ORDER — SODIUM CHLORIDE 0.9% FLUSH
10.0000 mL | Freq: Once | INTRAVENOUS | Status: AC
  Administered 2017-03-20: 10 mL via INTRAVENOUS
  Filled 2017-03-20: qty 10

## 2017-03-20 MED ORDER — TRASTUZUMAB CHEMO 150 MG IV SOLR
2.0000 mg/kg | Freq: Once | INTRAVENOUS | Status: AC
  Administered 2017-03-20: 126 mg via INTRAVENOUS
  Filled 2017-03-20: qty 6

## 2017-03-20 MED ORDER — SODIUM CHLORIDE 0.9 % IV SOLN
20.0000 mg | Freq: Once | INTRAVENOUS | Status: AC
  Administered 2017-03-20: 20 mg via INTRAVENOUS
  Filled 2017-03-20: qty 2

## 2017-03-20 MED ORDER — ACETAMINOPHEN 325 MG PO TABS
650.0000 mg | ORAL_TABLET | Freq: Once | ORAL | Status: AC
  Administered 2017-03-20: 650 mg via ORAL
  Filled 2017-03-20: qty 2

## 2017-03-20 MED ORDER — SODIUM CHLORIDE 0.9 % IV SOLN
80.0000 mg/m2 | Freq: Once | INTRAVENOUS | Status: AC
  Administered 2017-03-20: 138 mg via INTRAVENOUS
  Filled 2017-03-20: qty 23

## 2017-03-20 MED ORDER — DIPHENHYDRAMINE HCL 50 MG/ML IJ SOLN
50.0000 mg | Freq: Once | INTRAMUSCULAR | Status: AC
  Administered 2017-03-20: 50 mg via INTRAVENOUS
  Filled 2017-03-20: qty 1

## 2017-03-20 NOTE — Progress Notes (Signed)
[place] OFFICE PROGRESS NOTE  Patient Care Team: Kathrine Haddock, NP as PCP - General (Nurse Practitioner) Robert Bellow, MD (General Surgery) Forest Gleason, MD (Oncology)  Breast cancer [facility])   Staging form: Breast, AJCC 7th Edition     Clinical stage from 04/06/2015: Stage IV (T2, N2, M1) - Signed by Evlyn Kanner, NP on 04/06/2015    Oncology History   1.history of carcinoma of right breast status post lumpectomy and radiation therapy (upper and outer quadrant) February 21, 2009.  Estrogen receptor 90%.  Progesterone receptor 60%.  Patient had lumpectomy and MammoSite partial breast radiation therapy followed by tamoxifen for 5 years.  Patient is still taking tamoxifen.. 2 recent mammogram in December of 2015 negative.  Further evaluation by surgeon revealed a palpable mass in the left upper outer quadrant near axilla.  Ultrasound revealed one small normal-appearing lymph node 0.6 cm in lower limit of axilla.  Adjacent to this there was ill-defined multilobulated hypoechoic mass 1.8 x 2.6 cm.  Biopsy of the left axillary mass was done which was positive for high-grade carcinoma.   2,estrogen receptor positive.  Progesterone receptor positive.  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 3.  MRI scan of breast (January, 2015) reveals multiple abnormal enlarged left axillary lymph node There are 2 oval some circumscribed mass at 2:30 and 3:00 position of the left breast.  0.6 cm x 1.3 cm and 4 x 7 mm Non-mass enhancement extending 2.4 cm these masses is 3.6 cm mass.  Findings suggestive of several pulmonary nodules.  PET  scan shows extensive disease, 4.started on Taxotere,perjeta, Herceptin(January 03, 2015- June 2016.   # June 2016- Herceptin [Letrozole + Ibrance] until May 2017.   # MAY 2017- PET PROGRESSION; # July 2017- START KADCYLA  x4 cycles- SEP 15th 2017CT- PROGRESSION  # SEP 2017- START Taxol + Herceptin.   # Right sided Pleural effusion [s/p Thora Sep  2017]  # Bone mets- X-geva q 6W  # MUGA Jan 18th-63%     Carcinoma of upper-outer quadrant of left breast in female, estrogen receptor positive ([place])   09/28/2016 Initial Diagnosis    Carcinoma of upper-outer quadrant of left breast in female, estrogen receptor positive ([place])       INTERVAL HISTORY:  MCKENLEIGH TARLTON 71 y.o.  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  Patient's appetite are good. Denies any weight loss. Denies any lumps or bumps. Complains of mild tingling and numbness of her upper extremities. None in the lower extremity is. Complains of mild rash on the bilateral upper extremity is not itchy. No swelling in the legs. No jaw pain.   REVIEW OF SYSTEMS:  A complete 10 point review of system is done which is negative except mentioned above/history of present illness.   PAST MEDICAL HISTORY :  Past Medical History:  Diagnosis Date  . Axillary mass 12/15/2014  . Breast cancer ([place]) 02/21/2009  . Cancer [facility]) 2010   right breast ductal carcinoma in situ  . Diabetes mellitus without complication ([place])   . Floaters 2014  . Hyperlipidemia 1990  . Hypertension 2009  . Malignant neoplasm of axillary tail of female breast ([place]) 12/16/2014  . Malignant neoplasm of upper-outer quadrant of female breast ([place]) 02/21/2009   DCIS of the right breast, intermediate grade, resected the negative margins.  ER 90%, PR 50%, MammoSite partial breast radiation  . 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 Special screening for malignant neoplasms, colon     PAST SURGICAL HISTORY :   Past Surgical History:  Procedure Laterality Date  . APPENDECTOMY  1956  . BREAST BIOPSY Right 2010   stereo  . BREAST SURGERY Right 2010   wide excision  . COLONOSCOPY  2008   ??? Md  . left axilla  biopsy Left 12-14-14  . PARTIAL HYSTERECTOMY  1977    FAMILY HISTORY :   Family History  Problem Relation Age of Onset  . Cancer Other     unknown family members with ovarian,colon,breast cancers  . Hypertension Mother   . Heart disease Daughter     MI    SOCIAL HISTORY:   Social History  Substance Use Topics  . Smoking status: Never Smoker  . Smokeless tobacco: Never Used  . Alcohol use No    ALLERGIES:  is allergic to penicillin g and sulfa antibiotics.  MEDICATIONS:  Current Outpatient Prescriptions  Medication Sig Dispense Refill  . ALPRAZolam (XANAX) 0.5 MG tablet Take 1 tablet (0.5 mg total) by mouth 3 (three) times daily as needed. 90 tablet 2  . amLODipine (NORVASC) 5 MG tablet Take 1 tablet (5 mg total) by mouth daily. 90 tablet 3  . aspirin 81 MG tablet Take 81 mg by mouth daily.    . Calcium 600-200 MG-UNIT tablet Take 1 tablet by mouth 2 (two) times daily. 120 tablet 3  . metFORMIN (GLUCOPHAGE) 500 MG tablet Take 2 tablets (1,000 mg total) by mouth 2 (two) times daily with a meal. 360 tablet 5  . Multiple Vitamins-Minerals (MULTIVITAMIN ADULT PO) Take by mouth.    . ONE TOUCH ULTRA TEST test strip     . potassium chloride SA (KLOR-CON M20) 20 MEQ tablet Take 2 tablets (40 mEq total) by mouth daily. 60 tablet 3  . rosuvastatin (CRESTOR) 5 MG tablet Take 1 tablet (5 mg total) by mouth at bedtime. 90 tablet 1  . valsartan-hydrochlorothiazide (DIOVAN-HCT) 320-25 MG tablet Take 1 tablet by mouth daily. 90 tablet 3   No current facility-administered medications for this visit.    Facility-Administered Medications Ordered in Other Visits  Medication Dose Route Frequency Provider Last Rate Last Dose  . heparin lock flush 100 unit/mL  250 Units Intracatheter PRN Forest Gleason, MD      . sodium chloride 0.9 % 250 mL with potassium chloride 40 mEq infusion   Intravenous Continuous Forest Gleason, MD   Stopped at 05/03/15 1229  . sodium chloride 0.9 % injection 10 mL  10 mL  Intracatheter PRN Forest Gleason, MD   10 mL at 04/18/15 1545    PHYSICAL EXAMINATION: ECOG PERFORMANCE STATUS: 0 - Asymptomatic  BP (!) 166/84 (BP Location: Left Arm, Patient Position: Sitting)   Pulse (!) 108   Temp 97.4 F (36.3 C) (Tympanic)   Resp 18   Wt 129 lb 6 oz (58.7 kg)   LMP  (LMP Unknown)   BMI 22.92 kg/m   Filed Weights   03/20/17 0910  Weight: 129 lb 6 oz (58.7 kg)    GENERAL: Well-nourished well-developed; Alert, no distress and comfortable.   Accompanied by her husband; 2 daughters. EYES: no pallor or icterus OROPHARYNX: no thrush or ulceration; poor dentition  NECK: supple, no masses felt LYMPH:  no palpable lymphadenopathy in the cervical, axillary or inguinal regions LUNGS: clear to auscultation  and  No wheeze or crackles HEART/CVS: regular rate & rhythm and no murmurs; No lower extremity edema ABDOMEN:abdomen soft, non-tender and normal bowel sounds Musculoskeletal:no cyanosis of digits and no clubbing  PSYCH: alert & oriented x 3 with fluent speech NEURO: no focal motor/sensory deficits SKIN:  no rashes or significant lesions LEFT BREAST- 1-2cm mass left upper outer quadrant/axillary tail. Nontender. Skin: macular rash noted on the bilateral extremities  LABORATORY DATA:  I have reviewed the data as listed    Component Value Date/Time   NA 140 03/20/2017 0847   NA 136 09/16/2015 0843   NA 138 03/28/2015 0923   K 3.7 03/20/2017 0847   K 3.3 (L) 03/28/2015 0923   CL 107 03/20/2017 0847   CL 101 03/28/2015 0923   CO2 26 03/20/2017 0847   CO2 28 03/28/2015 0923   GLUCOSE 207 (H) 03/20/2017 0847   GLUCOSE 173 (H) 03/28/2015 0923   BUN 9 03/20/2017 0847   BUN 10 09/16/2015 0843   BUN 9 03/28/2015 0923   CREATININE 0.74 03/20/2017 0847   CREATININE 0.56 03/28/2015 0923   CALCIUM 9.1 03/20/2017 0847   CALCIUM 8.6 (L) 03/28/2015 0923   PROT 6.9 03/20/2017 0847   PROT 7.2 09/16/2015 0843   PROT 7.1 03/28/2015 0923   ALBUMIN 3.9 03/20/2017 0847    ALBUMIN 4.6 09/16/2015 0843   ALBUMIN 3.6 03/28/2015 0923   AST 33 03/20/2017 0847   AST 43 (H) 03/28/2015 0923   ALT 20 03/20/2017 0847   ALT 26 03/28/2015 0923   ALKPHOS 114 03/20/2017 0847   ALKPHOS 85 03/28/2015 0923   BILITOT 0.8 03/20/2017 0847   BILITOT 0.9 09/16/2015 0843   BILITOT 0.6 03/28/2015 0923   GFRNONAA >60 03/20/2017 0847   GFRNONAA >60 03/28/2015 0923   GFRAA >60 03/20/2017 0847   GFRAA >60 03/28/2015 0923    No results found for: SPEP, UPEP  Lab Results  Component Value Date   WBC 5.7 03/20/2017   NEUTROABS 3.6 03/20/2017   HGB 10.5 (L) 03/20/2017   HCT 32.0 (L) 03/20/2017   MCV 85.0 03/20/2017   PLT 221 03/20/2017      Chemistry      Component Value Date/Time   NA 140 03/20/2017 0847   NA 136 09/16/2015 0843   NA 138 03/28/2015 0923   K 3.7 03/20/2017 0847   K 3.3 (L) 03/28/2015 0923   CL 107 03/20/2017 0847   CL 101 03/28/2015 0923   CO2 26 03/20/2017 0847   CO2 28 03/28/2015 0923   BUN 9 03/20/2017 0847   BUN 10 09/16/2015 0843   BUN 9 03/28/2015 0923   CREATININE 0.74 03/20/2017 0847   CREATININE 0.56 03/28/2015 0923      Component Value Date/Time   CALCIUM 9.1 03/20/2017 0847   CALCIUM 8.6 (L) 03/28/2015 0923   ALKPHOS 114 03/20/2017 0847   ALKPHOS 85 03/28/2015 0923   AST 33 03/20/2017 0847   AST 43 (H) 03/28/2015 0923   ALT 20 03/20/2017 0847   ALT 26 03/28/2015 0923   BILITOT 0.8 03/20/2017 0847   BILITOT 0.9 09/16/2015 0843   BILITOT 0.6 03/28/2015 0923     IMPRESSION: 1. Overall, today's study demonstrates similar findings to the prior examination from January 2018. Specifically, there is widespread metastatic disease to the bones which appears essentially unchanged. No other sites of extraskeletal metastatic disease are noted on today's examination. 2. Morphologic changes in the liver compatible with underlying cirrhosis redemonstrated. This is associated with evidence  of portal hypertension, including a dilated  portal vein and splenomegaly. 3. 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 4. Aortic atherosclerosis, in addition to two vessel coronary artery disease. Assessment for potential risk factor modification, dietary therapy or pharmacologic therapy may be warranted, if clinically indicated. 5. Additional incidental findings, as above.   Electronically Signed   By: Vinnie Langton M.D.   On: 03/11/2017 13:28  ASSESSMENT & PLAN:  Carcinoma of upper-outer quadrant of left breast in female, estrogen receptor positive ([place]) Metastatic breast cancer to the bone/lung. On taxol- Herceptin cycle weekly. March 26th 2017- CT scan - significant improvement.  Clinically no progression of disease noted.  # proceed with cycle #7 day 1 today. Tolerating chemo well; jan 2018- MUGA scan- 63%. Labs today reviewed;  acceptable for treatment today. Will order MUGA scan at next visit.   # Bone metastases on Xgeva; today.   # Skin rash- non itchy; recommend benadryl/ hydrocortisone prn.   # PN- G-1; monitor for now.   # weekly/ chemo/labs; follow up in 2 weeks/MD/chemo.     # I reviewed the blood work- with the patient in detail; also reviewed the imaging independently [as summarized above]; and with the patient in detail.   Orders Placed This Encounter  Procedures  . CBC with Differential    Standing Status:   Standing    Number of Occurrences:   20    Standing Expiration Date:   03/20/2018  . Basic metabolic panel    Standing Status:   Future    Standing Expiration Date:   03/20/2018  . Comprehensive metabolic panel    Standing Status:   Future    Standing Expiration Date:   03/20/2018  . Basic metabolic panel    Standing Status:   Future    Standing Expiration Date:   03/20/2018  . Comprehensive metabolic panel    Standing Status:    Future    Standing Expiration Date:   03/20/2018  . Basic metabolic panel    Standing Status:   Future    Standing Expiration Date:   03/20/2018       Cammie Sickle, MD 03/20/2017 4:37 PM

## 2017-03-20 NOTE — Assessment & Plan Note (Addendum)
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 On taxol- Herceptin cycle weekly. March 26th 2017- CT scan - significant improvement.  Clinically no progression of disease noted.  # proceed with cycle #7 day 1 today. Tolerating chemo well; jan 2018- MUGA scan- 63%. Labs today reviewed;  acceptable for treatment today. Will order MUGA scan at next visit.   # Bone metastases on Xgeva; today.   # Skin rash- non itchy; recommend benadryl/ hydrocortisone prn.   # PN- G-1; monitor for now.   # weekly/ chemo/labs; follow up in 2 weeks/MD/chemo.     # I reviewed the blood work- with the patient in detail; also reviewed the imaging independently [as summarized above]; and with the patient in detail.

## 2017-03-20 NOTE — Progress Notes (Signed)
Patient offers no complaints today. 

## 2017-03-27 ENCOUNTER — Inpatient Hospital Stay: Payer: Medicare Other

## 2017-03-27 VITALS — BP 169/89 | HR 93 | Temp 97.7°F | Resp 18

## 2017-03-27 DIAGNOSIS — Z170 Estrogen receptor positive status [ER+]: Secondary | ICD-10-CM | POA: Diagnosis not present

## 2017-03-27 DIAGNOSIS — Z5111 Encounter for antineoplastic chemotherapy: Secondary | ICD-10-CM | POA: Diagnosis not present

## 2017-03-27 DIAGNOSIS — Z5112 Encounter for antineoplastic immunotherapy: Secondary | ICD-10-CM | POA: Diagnosis not present

## 2017-03-27 DIAGNOSIS — C7951 Secondary malignant neoplasm of bone: Secondary | ICD-10-CM | POA: Diagnosis not present

## 2017-03-27 DIAGNOSIS — C50412 Malignant neoplasm of upper-outer quadrant of left female breast: Secondary | ICD-10-CM

## 2017-03-27 DIAGNOSIS — Z79810 Long term (current) use of selective estrogen receptor modulators (SERMs): Secondary | ICD-10-CM | POA: Diagnosis not present

## 2017-03-27 LAB — CBC WITH DIFFERENTIAL/PLATELET
BASOS ABS: 0 10*3/uL (ref 0–0.1)
BASOS PCT: 1 %
Eosinophils Absolute: 0.2 10*3/uL (ref 0–0.7)
Eosinophils Relative: 4 %
HEMATOCRIT: 29.7 % — AB (ref 35.0–47.0)
HEMOGLOBIN: 10 g/dL — AB (ref 12.0–16.0)
Lymphocytes Relative: 21 %
Lymphs Abs: 1.2 10*3/uL (ref 1.0–3.6)
MCH: 28.2 pg (ref 26.0–34.0)
MCHC: 33.6 g/dL (ref 32.0–36.0)
MCV: 83.9 fL (ref 80.0–100.0)
Monocytes Absolute: 0.4 10*3/uL (ref 0.2–0.9)
Monocytes Relative: 7 %
NEUTROS ABS: 3.8 10*3/uL (ref 1.4–6.5)
NEUTROS PCT: 67 %
Platelets: 214 10*3/uL (ref 150–440)
RBC: 3.54 MIL/uL — ABNORMAL LOW (ref 3.80–5.20)
RDW: 21.2 % — ABNORMAL HIGH (ref 11.5–14.5)
WBC: 5.6 10*3/uL (ref 3.6–11.0)

## 2017-03-27 LAB — COMPREHENSIVE METABOLIC PANEL
ALBUMIN: 3.7 g/dL (ref 3.5–5.0)
ALT: 24 U/L (ref 14–54)
AST: 37 U/L (ref 15–41)
Alkaline Phosphatase: 116 U/L (ref 38–126)
Anion gap: 7 (ref 5–15)
BILIRUBIN TOTAL: 0.6 mg/dL (ref 0.3–1.2)
BUN: 11 mg/dL (ref 6–20)
CALCIUM: 9.2 mg/dL (ref 8.9–10.3)
CO2: 25 mmol/L (ref 22–32)
Chloride: 106 mmol/L (ref 101–111)
Creatinine, Ser: 0.67 mg/dL (ref 0.44–1.00)
GFR calc Af Amer: >60 mL/min (ref >60)
GFR calc non Af Amer: >60 mL/min (ref >60)
GLUCOSE: 214 mg/dL — AB (ref 65–99)
POTASSIUM: 3.8 mmol/L (ref 3.5–5.1)
Sodium: 138 mmol/L (ref 135–145)
TOTAL PROTEIN: 6.9 g/dL (ref 6.5–8.1)

## 2017-03-27 MED ORDER — SODIUM CHLORIDE 0.9 % IV SOLN
Freq: Once | INTRAVENOUS | Status: AC
  Administered 2017-03-27: 09:00:00 via INTRAVENOUS
  Filled 2017-03-27: qty 1000

## 2017-03-27 MED ORDER — SODIUM CHLORIDE 0.9 % IV SOLN
20.0000 mg | Freq: Once | INTRAVENOUS | Status: AC
  Administered 2017-03-27: 20 mg via INTRAVENOUS
  Filled 2017-03-27: qty 2

## 2017-03-27 MED ORDER — SODIUM CHLORIDE 0.9 % IV SOLN
2.0000 mg/kg | Freq: Once | INTRAVENOUS | Status: AC
  Administered 2017-03-27: 126 mg via INTRAVENOUS
  Filled 2017-03-27: qty 6

## 2017-03-27 MED ORDER — DIPHENHYDRAMINE HCL 50 MG/ML IJ SOLN
50.0000 mg | Freq: Once | INTRAMUSCULAR | Status: AC
  Administered 2017-03-27: 50 mg via INTRAVENOUS
  Filled 2017-03-27: qty 1

## 2017-03-27 MED ORDER — PACLITAXEL CHEMO INJECTION 300 MG/50ML
80.0000 mg/m2 | Freq: Once | INTRAVENOUS | Status: AC
  Administered 2017-03-27: 138 mg via INTRAVENOUS
  Filled 2017-03-27: qty 23

## 2017-03-27 MED ORDER — FAMOTIDINE IN NACL 20-0.9 MG/50ML-% IV SOLN
20.0000 mg | Freq: Once | INTRAVENOUS | Status: AC
  Administered 2017-03-27: 20 mg via INTRAVENOUS
  Filled 2017-03-27: qty 50

## 2017-03-27 MED ORDER — ACETAMINOPHEN 325 MG PO TABS
650.0000 mg | ORAL_TABLET | Freq: Once | ORAL | Status: AC
  Administered 2017-03-27: 650 mg via ORAL
  Filled 2017-03-27: qty 2

## 2017-03-27 MED ORDER — HEPARIN SOD (PORK) LOCK FLUSH 100 UNIT/ML IV SOLN
500.0000 [IU] | Freq: Once | INTRAVENOUS | Status: AC | PRN
  Administered 2017-03-27: 500 [IU]
  Filled 2017-03-27: qty 5

## 2017-04-03 ENCOUNTER — Inpatient Hospital Stay (HOSPITAL_BASED_OUTPATIENT_CLINIC_OR_DEPARTMENT_OTHER): Payer: Medicare Other | Admitting: Internal Medicine

## 2017-04-03 ENCOUNTER — Inpatient Hospital Stay: Payer: Medicare Other

## 2017-04-03 VITALS — BP 154/80 | HR 101 | Temp 97.5°F | Resp 18 | Wt 130.4 lb

## 2017-04-03 DIAGNOSIS — C50412 Malignant neoplasm of upper-outer quadrant of left female breast: Secondary | ICD-10-CM | POA: Diagnosis not present

## 2017-04-03 DIAGNOSIS — R21 Rash and other nonspecific skin eruption: Secondary | ICD-10-CM | POA: Diagnosis not present

## 2017-04-03 DIAGNOSIS — Z79899 Other long term (current) drug therapy: Secondary | ICD-10-CM

## 2017-04-03 DIAGNOSIS — E785 Hyperlipidemia, unspecified: Secondary | ICD-10-CM

## 2017-04-03 DIAGNOSIS — I2510 Atherosclerotic heart disease of native coronary artery without angina pectoris: Secondary | ICD-10-CM

## 2017-04-03 DIAGNOSIS — G629 Polyneuropathy, unspecified: Secondary | ICD-10-CM | POA: Diagnosis not present

## 2017-04-03 DIAGNOSIS — Z7984 Long term (current) use of oral hypoglycemic drugs: Secondary | ICD-10-CM

## 2017-04-03 DIAGNOSIS — Z79810 Long term (current) use of selective estrogen receptor modulators (SERMs): Secondary | ICD-10-CM | POA: Diagnosis not present

## 2017-04-03 DIAGNOSIS — E119 Type 2 diabetes mellitus without complications: Secondary | ICD-10-CM

## 2017-04-03 DIAGNOSIS — Z170 Estrogen receptor positive status [ER+]: Secondary | ICD-10-CM

## 2017-04-03 DIAGNOSIS — I10 Essential (primary) hypertension: Secondary | ICD-10-CM | POA: Diagnosis not present

## 2017-04-03 DIAGNOSIS — C7951 Secondary malignant neoplasm of bone: Secondary | ICD-10-CM

## 2017-04-03 DIAGNOSIS — Z5181 Encounter for therapeutic drug level monitoring: Secondary | ICD-10-CM

## 2017-04-03 DIAGNOSIS — Z7982 Long term (current) use of aspirin: Secondary | ICD-10-CM

## 2017-04-03 DIAGNOSIS — I700 Atherosclerosis of aorta: Secondary | ICD-10-CM

## 2017-04-03 DIAGNOSIS — Z5112 Encounter for antineoplastic immunotherapy: Secondary | ICD-10-CM | POA: Diagnosis not present

## 2017-04-03 DIAGNOSIS — Z5111 Encounter for antineoplastic chemotherapy: Secondary | ICD-10-CM | POA: Diagnosis not present

## 2017-04-03 LAB — CBC WITH DIFFERENTIAL/PLATELET
BASOS ABS: 0 10*3/uL (ref 0–0.1)
Basophils Relative: 1 %
EOS ABS: 0.2 10*3/uL (ref 0–0.7)
Eosinophils Relative: 6 %
HEMATOCRIT: 30 % — AB (ref 35.0–47.0)
HEMOGLOBIN: 10 g/dL — AB (ref 12.0–16.0)
Lymphocytes Relative: 33 %
Lymphs Abs: 1.1 10*3/uL (ref 1.0–3.6)
MCH: 28.4 pg (ref 26.0–34.0)
MCHC: 33.5 g/dL (ref 32.0–36.0)
MCV: 84.8 fL (ref 80.0–100.0)
MONO ABS: 0.2 10*3/uL (ref 0.2–0.9)
MONOS PCT: 7 %
NEUTROS ABS: 1.8 10*3/uL (ref 1.4–6.5)
NEUTROS PCT: 53 %
Platelets: 201 10*3/uL (ref 150–440)
RBC: 3.53 MIL/uL — ABNORMAL LOW (ref 3.80–5.20)
RDW: 22.5 % — AB (ref 11.5–14.5)
WBC: 3.3 10*3/uL — ABNORMAL LOW (ref 3.6–11.0)

## 2017-04-03 LAB — BASIC METABOLIC PANEL
ANION GAP: 8 (ref 5–15)
BUN: 10 mg/dL (ref 6–20)
CALCIUM: 9.2 mg/dL (ref 8.9–10.3)
CO2: 23 mmol/L (ref 22–32)
CREATININE: 0.65 mg/dL (ref 0.44–1.00)
Chloride: 106 mmol/L (ref 101–111)
GFR calc non Af Amer: >60 mL/min (ref >60)
Glucose, Bld: 282 mg/dL — ABNORMAL HIGH (ref 65–99)
Potassium: 3.6 mmol/L (ref 3.5–5.1)
SODIUM: 137 mmol/L (ref 135–145)

## 2017-04-03 MED ORDER — FAMOTIDINE IN NACL 20-0.9 MG/50ML-% IV SOLN
20.0000 mg | Freq: Once | INTRAVENOUS | Status: AC
  Administered 2017-04-03: 20 mg via INTRAVENOUS
  Filled 2017-04-03: qty 50

## 2017-04-03 MED ORDER — HEPARIN SOD (PORK) LOCK FLUSH 100 UNIT/ML IV SOLN
500.0000 [IU] | Freq: Once | INTRAVENOUS | Status: AC
  Administered 2017-04-03: 500 [IU] via INTRAVENOUS

## 2017-04-03 MED ORDER — SODIUM CHLORIDE 0.9% FLUSH
10.0000 mL | INTRAVENOUS | Status: DC | PRN
  Administered 2017-04-03: 10 mL via INTRAVENOUS
  Filled 2017-04-03: qty 10

## 2017-04-03 MED ORDER — DIPHENHYDRAMINE HCL 50 MG/ML IJ SOLN
50.0000 mg | Freq: Once | INTRAMUSCULAR | Status: AC
  Administered 2017-04-03: 50 mg via INTRAVENOUS
  Filled 2017-04-03: qty 1

## 2017-04-03 MED ORDER — SODIUM CHLORIDE 0.9 % IV SOLN
80.0000 mg/m2 | Freq: Once | INTRAVENOUS | Status: AC
  Administered 2017-04-03: 138 mg via INTRAVENOUS
  Filled 2017-04-03: qty 23

## 2017-04-03 MED ORDER — HEPARIN SOD (PORK) LOCK FLUSH 100 UNIT/ML IV SOLN
INTRAVENOUS | Status: AC
  Filled 2017-04-03: qty 5

## 2017-04-03 MED ORDER — DEXAMETHASONE SODIUM PHOSPHATE 100 MG/10ML IJ SOLN
20.0000 mg | Freq: Once | INTRAMUSCULAR | Status: AC
  Administered 2017-04-03: 20 mg via INTRAVENOUS
  Filled 2017-04-03: qty 2

## 2017-04-03 MED ORDER — SODIUM CHLORIDE 0.9 % IV SOLN
Freq: Once | INTRAVENOUS | Status: AC
  Administered 2017-04-03: 11:00:00 via INTRAVENOUS
  Filled 2017-04-03: qty 1000

## 2017-04-03 MED ORDER — ACETAMINOPHEN 325 MG PO TABS
650.0000 mg | ORAL_TABLET | Freq: Once | ORAL | Status: AC
  Administered 2017-04-03: 650 mg via ORAL
  Filled 2017-04-03: qty 2

## 2017-04-03 MED ORDER — TRASTUZUMAB CHEMO 150 MG IV SOLR
2.0000 mg/kg | Freq: Once | INTRAVENOUS | Status: AC
  Administered 2017-04-03: 126 mg via INTRAVENOUS
  Filled 2017-04-03: qty 6

## 2017-04-03 NOTE — Assessment & Plan Note (Addendum)
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 On taxol- Herceptin cycle weekly days-1,8,15 q 28days. March 26th 2017- CT scan - significant improvement.  Clinically no progression of disease noted.  # proceed with cycle #7 day 15 today. Tolerating chemo well; jan 2018- MUGA scan- 63%. Labs today reviewed;  acceptable for treatment today.MUGA scan ordered today.   # Bone metastases on Xgeva; today.   # Skin rash- non itchy; recommend benadryl/ hydrocortisone prn.   # follow up in 2 weeks/MD/chemo; MUGA scan prior.

## 2017-04-03 NOTE — Progress Notes (Signed)
[place] OFFICE PROGRESS NOTE  Patient Care Team: Kathrine Haddock, NP as PCP - General (Nurse Practitioner) Robert Bellow, MD (General Surgery) Forest Gleason, MD (Oncology)  Breast cancer [facility])   Staging form: Breast, AJCC 7th Edition     Clinical stage from 04/06/2015: Stage IV (T2, N2, M1) - Signed by Evlyn Kanner, NP on 04/06/2015    Oncology History   1.history of carcinoma of right breast status post lumpectomy and radiation therapy (upper and outer quadrant) February 21, 2009.  Estrogen receptor 90%.  Progesterone receptor 60%.  Patient had lumpectomy and MammoSite partial breast radiation therapy followed by tamoxifen for 5 years.  Patient is still taking tamoxifen.. 2 recent mammogram in December of 2015 negative.  Further evaluation by surgeon revealed a palpable mass in the left upper outer quadrant near axilla.  Ultrasound revealed one small normal-appearing lymph node 0.6 cm in lower limit of axilla.  Adjacent to this there was ill-defined multilobulated hypoechoic mass 1.8 x 2.6 cm.  Biopsy of the left axillary mass was done which was positive for high-grade carcinoma.   2,estrogen receptor positive.  Progesterone receptor positive.  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 3.  MRI scan of breast (January, 2015) reveals multiple abnormal enlarged left axillary lymph node There are 2 oval some circumscribed mass at 2:30 and 3:00 position of the left breast.  0.6 cm x 1.3 cm and 4 x 7 mm Non-mass enhancement extending 2.4 cm these masses is 3.6 cm mass.  Findings suggestive of several pulmonary nodules.  PET  scan shows extensive disease, 4.started on Taxotere,perjeta, Herceptin(January 03, 2015- June 2016.   # June 2016- Herceptin [Letrozole + Ibrance] until May 2017.   # MAY 2017- PET PROGRESSION; # July 2017- START KADCYLA  x4 cycles- SEP 15th 2017CT- PROGRESSION  # SEP 2017- START Taxol + Herceptin.   # Right sided Pleural effusion [s/p Thora Sep  2017]  # Bone mets- X-geva q 6W  # MUGA Jan 18th-63%     Carcinoma of upper-outer quadrant of left breast in female, estrogen receptor positive ([place])   09/28/2016 Initial Diagnosis    Carcinoma of upper-outer quadrant of left breast in female, estrogen receptor positive ([place])       INTERVAL HISTORY:  Tammy Pope 71 y.o.  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  Denies any worse and shortness of breath or cough. Skin rash is improved. No headaches. No jaw pain. Appetite is good. Gaining weight. Denies any lumps or bumps.   REVIEW OF SYSTEMS:  A complete 10 point review of system is done which is negative except mentioned above/history of present illness.   PAST MEDICAL HISTORY :  Past Medical History:  Diagnosis Date  . Axillary mass 12/15/2014  . Breast cancer ([place]) 02/21/2009  . Cancer [facility]) 2010   right breast ductal carcinoma in situ  . Diabetes mellitus without complication ([place])   . Floaters 2014  . Hyperlipidemia 1990  . Hypertension 2009  . Malignant neoplasm of axillary tail of female breast ([place]) 12/16/2014  . Malignant neoplasm of upper-outer quadrant of female breast ([place]) 02/21/2009   DCIS of the right breast, intermediate grade, resected the negative margins. ER 90%, PR 50%, MammoSite partial breast radiation  . 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 Special screening for malignant neoplasms, colon  PAST SURGICAL HISTORY :   Past Surgical History:  Procedure Laterality Date  . APPENDECTOMY  1956  . BREAST BIOPSY Right 2010   stereo  . BREAST SURGERY Right 2010   wide excision  . COLONOSCOPY  2008   ??? Md  . left axilla biopsy Left 12-14-14  . PARTIAL HYSTERECTOMY  1977    FAMILY HISTORY :   Family History  Problem Relation Age of Onset  . Cancer Other     unknown family  members with ovarian,colon,breast cancers  . Hypertension Mother   . Heart disease Daughter     MI    SOCIAL HISTORY:   Social History  Substance Use Topics  . Smoking status: Never Smoker  . Smokeless tobacco: Never Used  . Alcohol use No    ALLERGIES:  is allergic to penicillin g and sulfa antibiotics.  MEDICATIONS:  Current Outpatient Prescriptions  Medication Sig Dispense Refill  . ALPRAZolam (XANAX) 0.5 MG tablet Take 1 tablet (0.5 mg total) by mouth 3 (three) times daily as needed. 90 tablet 2  . amLODipine (NORVASC) 5 MG tablet Take 1 tablet (5 mg total) by mouth daily. 90 tablet 3  . aspirin 81 MG tablet Take 81 mg by mouth daily.    . Calcium 600-200 MG-UNIT tablet Take 1 tablet by mouth 2 (two) times daily. 120 tablet 3  . metFORMIN (GLUCOPHAGE) 500 MG tablet Take 2 tablets (1,000 mg total) by mouth 2 (two) times daily with a meal. 360 tablet 5  . Multiple Vitamins-Minerals (MULTIVITAMIN ADULT PO) Take by mouth.    . ONE TOUCH ULTRA TEST test strip     . potassium chloride SA (KLOR-CON M20) 20 MEQ tablet Take 2 tablets (40 mEq total) by mouth daily. 60 tablet 3  . rosuvastatin (CRESTOR) 5 MG tablet Take 1 tablet (5 mg total) by mouth at bedtime. 90 tablet 1  . valsartan-hydrochlorothiazide (DIOVAN-HCT) 320-25 MG tablet Take 1 tablet by mouth daily. 90 tablet 3   No current facility-administered medications for this visit.    Facility-Administered Medications Ordered in Other Visits  Medication Dose Route Frequency Provider Last Rate Last Dose  . heparin lock flush 100 unit/mL  250 Units Intracatheter PRN Forest Gleason, MD      . sodium chloride 0.9 % 250 mL with potassium chloride 40 mEq infusion   Intravenous Continuous Forest Gleason, MD   Stopped at 05/03/15 1229  . sodium chloride 0.9 % injection 10 mL  10 mL Intracatheter PRN Forest Gleason, MD   10 mL at 04/18/15 1545    PHYSICAL EXAMINATION: ECOG PERFORMANCE STATUS: 0 - Asymptomatic  BP (!) 154/80 (BP Location:  Left Arm, Patient Position: Sitting)   Pulse (!) 101   Temp 97.5 F (36.4 C) (Tympanic)   Resp 18   Wt 130 lb 6 oz (59.1 kg)   LMP  (LMP Unknown)   BMI 23.09 kg/m   Filed Weights   04/03/17 1005  Weight: 130 lb 6 oz (59.1 kg)    GENERAL: Well-nourished well-developed; Alert, no distress and comfortable.   Accompanied by her husband; 2 daughters. EYES: no pallor or icterus OROPHARYNX: no thrush or ulceration; poor dentition  NECK: supple, no masses felt LYMPH:  no palpable lymphadenopathy in the cervical, axillary or inguinal regions LUNGS: clear to auscultation and  No wheeze or crackles HEART/CVS: regular rate & rhythm and no murmurs; No lower extremity edema ABDOMEN:abdomen soft, non-tender and normal bowel sounds Musculoskeletal:no cyanosis of digits and no clubbing  PSYCH: alert & oriented x 3 with fluent speech NEURO: no focal motor/sensory deficits SKIN:  no rashes or significant lesions LEFT BREAST- 1-2cm mass left upper outer quadrant/axillary tail. Nontender. Skin: macular rash noted on the bilateral extremities  LABORATORY DATA:  I have reviewed the data as listed    Component Value Date/Time   NA 137 04/03/2017 0933   NA 136 09/16/2015 0843   NA 138 03/28/2015 0923   K 3.6 04/03/2017 0933   K 3.3 (L) 03/28/2015 0923   CL 106 04/03/2017 0933   CL 101 03/28/2015 0923   CO2 23 04/03/2017 0933   CO2 28 03/28/2015 0923   GLUCOSE 282 (H) 04/03/2017 0933   GLUCOSE 173 (H) 03/28/2015 0923   BUN 10 04/03/2017 0933   BUN 10 09/16/2015 0843   BUN 9 03/28/2015 0923   CREATININE 0.65 04/03/2017 0933   CREATININE 0.56 03/28/2015 0923   CALCIUM 9.2 04/03/2017 0933   CALCIUM 8.6 (L) 03/28/2015 0923   PROT 6.9 03/27/2017 0825   PROT 7.2 09/16/2015 0843   PROT 7.1 03/28/2015 0923   ALBUMIN 3.7 03/27/2017 0825   ALBUMIN 4.6 09/16/2015 0843   ALBUMIN 3.6 03/28/2015 0923   AST 37 03/27/2017 0825   AST 43 (H) 03/28/2015 0923   ALT 24 03/27/2017 0825   ALT 26  03/28/2015 0923   ALKPHOS 116 03/27/2017 0825   ALKPHOS 85 03/28/2015 0923   BILITOT 0.6 03/27/2017 0825   BILITOT 0.9 09/16/2015 0843   BILITOT 0.6 03/28/2015 0923   GFRNONAA >60 04/03/2017 0933   GFRNONAA >60 03/28/2015 0923   GFRAA >60 04/03/2017 0933   GFRAA >60 03/28/2015 0923    No results found for: SPEP, UPEP  Lab Results  Component Value Date   WBC 3.3 (L) 04/03/2017   NEUTROABS 1.8 04/03/2017   HGB 10.0 (L) 04/03/2017   HCT 30.0 (L) 04/03/2017   MCV 84.8 04/03/2017   PLT 201 04/03/2017      Chemistry      Component Value Date/Time   NA 137 04/03/2017 0933   NA 136 09/16/2015 0843   NA 138 03/28/2015 0923   K 3.6 04/03/2017 0933   K 3.3 (L) 03/28/2015 0923   CL 106 04/03/2017 0933   CL 101 03/28/2015 0923   CO2 23 04/03/2017 0933   CO2 28 03/28/2015 0923   BUN 10 04/03/2017 0933   BUN 10 09/16/2015 0843   BUN 9 03/28/2015 0923   CREATININE 0.65 04/03/2017 0933   CREATININE 0.56 03/28/2015 0923      Component Value Date/Time   CALCIUM 9.2 04/03/2017 0933   CALCIUM 8.6 (L) 03/28/2015 0923   ALKPHOS 116 03/27/2017 0825   ALKPHOS 85 03/28/2015 0923   AST 37 03/27/2017 0825   AST 43 (H) 03/28/2015 0923   ALT 24 03/27/2017 0825   ALT 26 03/28/2015 0923   BILITOT 0.6 03/27/2017 0825   BILITOT 0.9 09/16/2015 0843   BILITOT 0.6 03/28/2015 0923     IMPRESSION: 1. Overall, today's study demonstrates similar findings to the prior examination from January 2018. Specifically, there is widespread metastatic disease to the bones which appears essentially unchanged. No other sites of extraskeletal metastatic disease are noted on today's examination. 2. Morphologic changes in the liver compatible with underlying cirrhosis redemonstrated. This is associated with evidence of portal hypertension, including a dilated portal vein and splenomegaly. 3. 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 4. Aortic atherosclerosis, in addition to two vessel coronary artery disease. Assessment for potential risk factor modification, dietary therapy or pharmacologic therapy may be warranted, if clinically indicated. 5. Additional incidental findings, as above.   Electronically Signed   By: Vinnie Langton M.D.   On: 03/11/2017 13:28  ASSESSMENT & PLAN:  Carcinoma of upper-outer quadrant of left breast in female, estrogen receptor positive ([place]) Metastatic breast cancer to the bone/lung. On taxol- Herceptin cycle weekly days-1,8,15 q 28days. March 26th 2017- CT scan - significant improvement.  Clinically no progression of disease noted.  # proceed with cycle #7 day 15 today. Tolerating chemo well; jan 2018- MUGA scan- 63%. Labs today reviewed;  acceptable for treatment today.MUGA scan ordered today.   # Bone metastases on Xgeva; today.   # Skin rash- non itchy; recommend benadryl/ hydrocortisone prn.   # follow up in 2 weeks/MD/chemo; MUGA scan prior.    Orders Placed This Encounter  Procedures  . NM Cardiac [place] [facility]    Standing Status:   Future    Standing Expiration Date:   04/03/2018    Order Specific Question:   Reason for Exam (SYMPTOM  OR DIAGNOSIS REQUIRED)    Answer:   cardiotoxic chemo    Order Specific Question:   Preferred imaging location?    Answer:   [facility]    Order Specific Question:   If indicated for the ordered procedure, I authorize the administration of a radiopharmaceutical per Radiology protocol    Answer:   Yes       Cammie Sickle, MD 04/09/2017 8:10 AM

## 2017-04-03 NOTE — Progress Notes (Signed)
Patient here today for follow up.   

## 2017-04-11 ENCOUNTER — Encounter
Admission: RE | Admit: 2017-04-11 | Discharge: 2017-04-11 | Disposition: A | Discharge status: Home / Self Care | Payer: Medicare Other | Source: Ambulatory Visit | Attending: Internal Medicine | Admitting: Internal Medicine

## 2017-04-11 DIAGNOSIS — Z170 Estrogen receptor positive status [ER+]: Secondary | ICD-10-CM | POA: Insufficient documentation

## 2017-04-11 DIAGNOSIS — C50912 Malignant neoplasm of unspecified site of left female breast: Secondary | ICD-10-CM | POA: Diagnosis not present

## 2017-04-11 DIAGNOSIS — Z5181 Encounter for therapeutic drug level monitoring: Secondary | ICD-10-CM | POA: Diagnosis not present

## 2017-04-11 DIAGNOSIS — Z79899 Other long term (current) drug therapy: Secondary | ICD-10-CM | POA: Diagnosis not present

## 2017-04-11 DIAGNOSIS — C50412 Malignant neoplasm of upper-outer quadrant of left female breast: Secondary | ICD-10-CM | POA: Insufficient documentation

## 2017-04-11 MED ORDER — TECHNETIUM TC 99M-LABELED RED BLOOD CELLS IV KIT
20.0000 | PACK | Freq: Once | INTRAVENOUS | Status: AC | PRN
  Administered 2017-04-11: 19.936 via INTRAVENOUS

## 2017-04-17 ENCOUNTER — Other Ambulatory Visit: Payer: Self-pay

## 2017-04-17 ENCOUNTER — Inpatient Hospital Stay: Payer: Medicare Other

## 2017-04-17 ENCOUNTER — Inpatient Hospital Stay: Payer: Medicare Other | Attending: Internal Medicine

## 2017-04-17 ENCOUNTER — Inpatient Hospital Stay (HOSPITAL_BASED_OUTPATIENT_CLINIC_OR_DEPARTMENT_OTHER): Payer: Medicare Other | Admitting: Internal Medicine

## 2017-04-17 VITALS — BP 160/87 | HR 120 | Temp 98.0°F | Resp 22 | Wt 129.4 lb

## 2017-04-17 DIAGNOSIS — E785 Hyperlipidemia, unspecified: Secondary | ICD-10-CM

## 2017-04-17 DIAGNOSIS — C50412 Malignant neoplasm of upper-outer quadrant of left female breast: Secondary | ICD-10-CM

## 2017-04-17 DIAGNOSIS — Z8041 Family history of malignant neoplasm of ovary: Secondary | ICD-10-CM | POA: Diagnosis not present

## 2017-04-17 DIAGNOSIS — Z170 Estrogen receptor positive status [ER+]: Principal | ICD-10-CM

## 2017-04-17 DIAGNOSIS — Z79810 Long term (current) use of selective estrogen receptor modulators (SERMs): Secondary | ICD-10-CM | POA: Insufficient documentation

## 2017-04-17 DIAGNOSIS — E119 Type 2 diabetes mellitus without complications: Secondary | ICD-10-CM

## 2017-04-17 DIAGNOSIS — Z923 Personal history of irradiation: Secondary | ICD-10-CM | POA: Diagnosis not present

## 2017-04-17 DIAGNOSIS — R000 Tachycardia, unspecified: Secondary | ICD-10-CM | POA: Diagnosis not present

## 2017-04-17 DIAGNOSIS — Z9049 Acquired absence of other specified parts of digestive tract: Secondary | ICD-10-CM | POA: Insufficient documentation

## 2017-04-17 DIAGNOSIS — Z5111 Encounter for antineoplastic chemotherapy: Secondary | ICD-10-CM | POA: Diagnosis not present

## 2017-04-17 DIAGNOSIS — C7800 Secondary malignant neoplasm of unspecified lung: Secondary | ICD-10-CM | POA: Insufficient documentation

## 2017-04-17 DIAGNOSIS — Z7982 Long term (current) use of aspirin: Secondary | ICD-10-CM

## 2017-04-17 DIAGNOSIS — C7951 Secondary malignant neoplasm of bone: Secondary | ICD-10-CM | POA: Insufficient documentation

## 2017-04-17 DIAGNOSIS — I10 Essential (primary) hypertension: Secondary | ICD-10-CM | POA: Insufficient documentation

## 2017-04-17 DIAGNOSIS — Z79899 Other long term (current) drug therapy: Secondary | ICD-10-CM | POA: Insufficient documentation

## 2017-04-17 DIAGNOSIS — Z800 Family history of malignant neoplasm of digestive organs: Secondary | ICD-10-CM

## 2017-04-17 DIAGNOSIS — I700 Atherosclerosis of aorta: Secondary | ICD-10-CM | POA: Diagnosis not present

## 2017-04-17 DIAGNOSIS — Z7984 Long term (current) use of oral hypoglycemic drugs: Secondary | ICD-10-CM | POA: Insufficient documentation

## 2017-04-17 DIAGNOSIS — C773 Secondary and unspecified malignant neoplasm of axilla and upper limb lymph nodes: Secondary | ICD-10-CM

## 2017-04-17 DIAGNOSIS — R161 Splenomegaly, not elsewhere classified: Secondary | ICD-10-CM | POA: Insufficient documentation

## 2017-04-17 DIAGNOSIS — J90 Pleural effusion, not elsewhere classified: Secondary | ICD-10-CM | POA: Insufficient documentation

## 2017-04-17 DIAGNOSIS — Z803 Family history of malignant neoplasm of breast: Secondary | ICD-10-CM | POA: Diagnosis not present

## 2017-04-17 DIAGNOSIS — Z5112 Encounter for antineoplastic immunotherapy: Secondary | ICD-10-CM | POA: Insufficient documentation

## 2017-04-17 LAB — CBC WITH DIFFERENTIAL/PLATELET
BASOS PCT: 1 %
Basophils Absolute: 0 10*3/uL (ref 0–0.1)
EOS ABS: 0.1 10*3/uL (ref 0–0.7)
Eosinophils Relative: 3 %
HEMATOCRIT: 33 % — AB (ref 35.0–47.0)
HEMOGLOBIN: 10.8 g/dL — AB (ref 12.0–16.0)
Lymphocytes Relative: 23 %
Lymphs Abs: 1.2 10*3/uL (ref 1.0–3.6)
MCH: 28.1 pg (ref 26.0–34.0)
MCHC: 32.8 g/dL (ref 32.0–36.0)
MCV: 85.7 fL (ref 80.0–100.0)
Monocytes Absolute: 0.6 10*3/uL (ref 0.2–0.9)
Monocytes Relative: 11 %
NEUTROS ABS: 3.3 10*3/uL (ref 1.4–6.5)
NEUTROS PCT: 62 %
Platelets: 225 10*3/uL (ref 150–440)
RBC: 3.86 MIL/uL (ref 3.80–5.20)
RDW: 21.8 % — ABNORMAL HIGH (ref 11.5–14.5)
WBC: 5.2 10*3/uL (ref 3.6–11.0)

## 2017-04-17 LAB — COMPREHENSIVE METABOLIC PANEL
ALK PHOS: 130 U/L — AB (ref 38–126)
ALT: 23 U/L (ref 14–54)
ANION GAP: 9 (ref 5–15)
AST: 39 U/L (ref 15–41)
Albumin: 3.8 g/dL (ref 3.5–5.0)
BILIRUBIN TOTAL: 0.7 mg/dL (ref 0.3–1.2)
BUN: 11 mg/dL (ref 6–20)
CALCIUM: 9.3 mg/dL (ref 8.9–10.3)
CO2: 25 mmol/L (ref 22–32)
Chloride: 105 mmol/L (ref 101–111)
Creatinine, Ser: 0.7 mg/dL (ref 0.44–1.00)
GFR calc Af Amer: >60 mL/min (ref >60)
GFR calc non Af Amer: >60 mL/min (ref >60)
GLUCOSE: 255 mg/dL — AB (ref 65–99)
Potassium: 3.7 mmol/L (ref 3.5–5.1)
SODIUM: 139 mmol/L (ref 135–145)
TOTAL PROTEIN: 7.2 g/dL (ref 6.5–8.1)

## 2017-04-17 MED ORDER — FAMOTIDINE IN NACL 20-0.9 MG/50ML-% IV SOLN
20.0000 mg | Freq: Once | INTRAVENOUS | Status: AC
  Administered 2017-04-17: 20 mg via INTRAVENOUS
  Filled 2017-04-17: qty 50

## 2017-04-17 MED ORDER — ACETAMINOPHEN 325 MG PO TABS
650.0000 mg | ORAL_TABLET | Freq: Once | ORAL | Status: AC
  Administered 2017-04-17: 650 mg via ORAL
  Filled 2017-04-17: qty 2

## 2017-04-17 MED ORDER — HEPARIN SOD (PORK) LOCK FLUSH 100 UNIT/ML IV SOLN
500.0000 [IU] | Freq: Once | INTRAVENOUS | Status: AC | PRN
  Administered 2017-04-17: 500 [IU]

## 2017-04-17 MED ORDER — HEPARIN SOD (PORK) LOCK FLUSH 100 UNIT/ML IV SOLN
INTRAVENOUS | Status: AC
  Filled 2017-04-17: qty 5

## 2017-04-17 MED ORDER — DIPHENHYDRAMINE HCL 50 MG/ML IJ SOLN
50.0000 mg | Freq: Once | INTRAMUSCULAR | Status: AC
  Administered 2017-04-17: 50 mg via INTRAVENOUS
  Filled 2017-04-17: qty 1

## 2017-04-17 MED ORDER — SODIUM CHLORIDE 0.9 % IV SOLN
20.0000 mg | Freq: Once | INTRAVENOUS | Status: AC
  Administered 2017-04-17: 20 mg via INTRAVENOUS
  Filled 2017-04-17: qty 2

## 2017-04-17 MED ORDER — SODIUM CHLORIDE 0.9 % IV SOLN
Freq: Once | INTRAVENOUS | Status: AC
  Administered 2017-04-17: 10:00:00 via INTRAVENOUS
  Filled 2017-04-17: qty 1000

## 2017-04-17 MED ORDER — DEXTROSE 5 % IV SOLN
80.0000 mg/m2 | Freq: Once | INTRAVENOUS | Status: AC
  Administered 2017-04-17: 138 mg via INTRAVENOUS
  Filled 2017-04-17: qty 23

## 2017-04-17 MED ORDER — TRASTUZUMAB CHEMO 150 MG IV SOLR
2.0000 mg/kg | Freq: Once | INTRAVENOUS | Status: AC
  Administered 2017-04-17: 126 mg via INTRAVENOUS
  Filled 2017-04-17: qty 6

## 2017-04-17 NOTE — Progress Notes (Signed)
HR 120 today. Proceed with treatment per Dr Rogue Bussing

## 2017-04-17 NOTE — Assessment & Plan Note (Addendum)
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 On taxol- Herceptin cycle weekly days-1,8,15 q 28days. March 26th 2017- CT scan - significant improvement.  Clinically no progression of disease noted.  # proceed with cycle #7 day 15 today. Tolerating chemo well;. Labs today reviewed;  acceptable for treatment today April 26th -MUGA scan- 58 % [Jan 2018-baseline 63%].   # Tachycardia- EKG revealed sinus tachycardia; with sinus arrhythmia. No new recommendations.  # Bone metastases on Xgeva   # follow up in 2 weeks/MD/chemo.

## 2017-04-17 NOTE — Progress Notes (Signed)
Patient here today for follow up.   

## 2017-04-17 NOTE — Progress Notes (Signed)
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  Estrogen receptor 90%.  Progesterone receptor 60%.  Patient had lumpectomy and MammoSite partial breast radiation therapy followed by tamoxifen for 5 years.  Patient is still taking tamoxifen.. 2 recent mammogram in December of 2015 negative.  Further evaluation by surgeon revealed a palpable mass in the left upper outer quadrant near axilla.  Ultrasound revealed one small normal-appearing lymph node 0.6 cm in lower limit of axilla.  Adjacent to this there was ill-defined multilobulated hypoechoic mass 1.8 x 2.6 cm.  Biopsy of the left axillary mass was done which was positive for high-grade carcinoma.   2,estrogen receptor positive.  Progesterone receptor positive.  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 3.  MRI scan of breast (January, 2015) reveals multiple abnormal enlarged left axillary lymph node There are 2 oval some circumscribed mass at 2:30 and 3:00 position of the left breast.  0.6 cm x 1.3 cm and 4 x 7 mm Non-mass enhancement extending 2.4 cm these masses is 3.6 cm mass.  Findings suggestive of several pulmonary nodules.  PET  scan shows extensive disease, 4.started on Taxotere,perjeta, Herceptin(January 03, 2015- June 2016.   # June 2016- Herceptin [Letrozole + Ibrance] until May 2017.   # MAY 2017- PET PROGRESSION; # July 2017- START KADCYLA  x4 cycles- SEP 15th 2017CT- PROGRESSION  # SEP 2017- START Taxol + Herceptin.   # Right sided Pleural effusion [s/p Thora Sep  2017]  # Bone mets- X-geva q 6W  # MUGA Jan 18th-63%     Carcinoma of upper-outer quadrant of left breast in female, estrogen receptor positive ([place])   09/28/2016 Initial Diagnosis    Carcinoma of upper-outer quadrant of left breast in female, estrogen receptor positive ([place])       INTERVAL HISTORY:  Tammy Pope 71 y.o.  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  Patient denies any chest pain. Denies any dizziness. Denies any worsening shortness of breath or cough. Denies any palpitations. No headaches. No jaw pain. Appetite is good. Gaining weight. Denies any lumps or bumps.   REVIEW OF SYSTEMS:  A complete 10 point review of system is done which is negative except mentioned above/history of present illness.   PAST MEDICAL HISTORY :  Past Medical History:  Diagnosis Date  . Axillary mass 12/15/2014  . Breast cancer ([place]) 02/21/2009  . Cancer [facility]) 2010   right breast ductal carcinoma in situ  . Diabetes mellitus without complication ([place])   . Floaters 2014  . Hyperlipidemia 1990  . Hypertension 2009  . Malignant neoplasm of axillary tail of female breast ([place]) 12/16/2014  . Malignant neoplasm of upper-outer quadrant of female breast ([place]) 02/21/2009   DCIS of the right breast, intermediate grade, resected the negative margins. ER 90%, PR 50%, MammoSite partial breast radiation  . 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 Special screening for malignant neoplasms, colon     PAST SURGICAL HISTORY :   Past Surgical History:  Procedure Laterality Date  . APPENDECTOMY  1956  . BREAST BIOPSY Right 2010   stereo  . BREAST SURGERY Right 2010   wide excision  . COLONOSCOPY  2008   ??? Md  . left axilla biopsy Left 12-14-14  . PARTIAL HYSTERECTOMY  1977    FAMILY HISTORY :    Family History  Problem Relation Age of Onset  . Cancer Other     unknown family members with ovarian,colon,breast cancers  . Hypertension Mother   . Heart disease Daughter     MI    SOCIAL HISTORY:   Social History  Substance Use Topics  . Smoking status: Never Smoker  . Smokeless tobacco: Never Used  . Alcohol use No    ALLERGIES:  is allergic to penicillin g and sulfa antibiotics.  MEDICATIONS:  Current Outpatient Prescriptions  Medication Sig Dispense Refill  . ALPRAZolam (XANAX) 0.5 MG tablet Take 1 tablet (0.5 mg total) by mouth 3 (three) times daily as needed. 90 tablet 2  . amLODipine (NORVASC) 5 MG tablet Take 1 tablet (5 mg total) by mouth daily. 90 tablet 3  . aspirin 81 MG tablet Take 81 mg by mouth daily.    . Calcium 600-200 MG-UNIT tablet Take 1 tablet by mouth 2 (two) times daily. 120 tablet 3  . metFORMIN (GLUCOPHAGE) 500 MG tablet Take 2 tablets (1,000 mg total) by mouth 2 (two) times daily with a meal. 360 tablet 5  . Multiple Vitamins-Minerals (MULTIVITAMIN ADULT PO) Take by mouth.    . ONE TOUCH ULTRA TEST test strip     . potassium chloride SA (KLOR-CON M20) 20 MEQ tablet Take 2 tablets (40 mEq total) by mouth daily. 60 tablet 3  . rosuvastatin (CRESTOR) 5 MG tablet Take 1 tablet (5 mg total) by mouth at bedtime. 90 tablet 1  . valsartan-hydrochlorothiazide (DIOVAN-HCT) 320-25 MG tablet Take 1 tablet by mouth daily. 90 tablet 3   No current facility-administered medications for this visit.    Facility-Administered Medications Ordered in Other Visits  Medication Dose Route Frequency Provider Last Rate Last Dose  . heparin lock flush 100 unit/mL  250 Units Intracatheter PRN Forest Gleason, MD      . sodium chloride 0.9 % 250 mL with potassium chloride 40 mEq infusion   Intravenous Continuous Forest Gleason, MD   Stopped at 05/03/15 1229  . sodium chloride 0.9 % injection 10 mL  10 mL Intracatheter PRN Forest Gleason, MD   10 mL at 04/18/15 1545    PHYSICAL  EXAMINATION: ECOG PERFORMANCE STATUS: 0 - Asymptomatic  BP (!) 160/87 (BP Location: Left Arm, Patient Position: Sitting)   Pulse (!) 120   Temp 98 F (36.7 C) (Tympanic)   Resp (!) 22   Wt 129 lb 6 oz (58.7 kg)   LMP  (LMP Unknown)   BMI 22.92 kg/m   Filed Weights   04/17/17 0842  Weight: 129 lb 6 oz (58.7 kg)    GENERAL: Well-nourished well-developed; Alert, no distress and comfortable.   Accompanied by her husband; 2 daughters. EYES: no pallor or icterus OROPHARYNX: no thrush or ulceration; poor dentition  NECK: supple, no masses felt LYMPH:  no palpable lymphadenopathy in the cervical, axillary or inguinal regions LUNGS: clear to auscultation and  No wheeze or crackles HEART/CVS: regular rate & rhythm and no murmurs; No lower extremity edema  ABDOMEN:abdomen soft, non-tender and normal bowel sounds Musculoskeletal:no cyanosis of digits and no clubbing  PSYCH: alert & oriented x 3 with fluent speech NEURO: no focal motor/sensory deficits SKIN:  no rashes or significant lesions LEFT BREAST- 1-2cm mass left upper outer quadrant/axillary tail. Nontender. Skin: macular rash noted on the bilateral extremities  LABORATORY DATA:  I have reviewed the data as listed    Component Value Date/Time   NA 139 04/17/2017 0802   NA 136 09/16/2015 0843   NA 138 03/28/2015 0923   K 3.7 04/17/2017 0802   K 3.3 (L) 03/28/2015 0923   CL 105 04/17/2017 0802   CL 101 03/28/2015 0923   CO2 25 04/17/2017 0802   CO2 28 03/28/2015 0923   GLUCOSE 255 (H) 04/17/2017 0802   GLUCOSE 173 (H) 03/28/2015 0923   BUN 11 04/17/2017 0802   BUN 10 09/16/2015 0843   BUN 9 03/28/2015 0923   CREATININE 0.70 04/17/2017 0802   CREATININE 0.56 03/28/2015 0923   CALCIUM 9.3 04/17/2017 0802   CALCIUM 8.6 (L) 03/28/2015 0923   PROT 7.2 04/17/2017 0802   PROT 7.2 09/16/2015 0843   PROT 7.1 03/28/2015 0923   ALBUMIN 3.8 04/17/2017 0802   ALBUMIN 4.6 09/16/2015 0843   ALBUMIN 3.6 03/28/2015 0923   AST 39  04/17/2017 0802   AST 43 (H) 03/28/2015 0923   ALT 23 04/17/2017 0802   ALT 26 03/28/2015 0923   ALKPHOS 130 (H) 04/17/2017 0802   ALKPHOS 85 03/28/2015 0923   BILITOT 0.7 04/17/2017 0802   BILITOT 0.9 09/16/2015 0843   BILITOT 0.6 03/28/2015 0923   GFRNONAA >60 04/17/2017 0802   GFRNONAA >60 03/28/2015 0923   GFRAA >60 04/17/2017 0802   GFRAA >60 03/28/2015 0923    No results found for: SPEP, UPEP  Lab Results  Component Value Date   WBC 5.2 04/17/2017   NEUTROABS 3.3 04/17/2017   HGB 10.8 (L) 04/17/2017   HCT 33.0 (L) 04/17/2017   MCV 85.7 04/17/2017   PLT 225 04/17/2017      Chemistry      Component Value Date/Time   NA 139 04/17/2017 0802   NA 136 09/16/2015 0843   NA 138 03/28/2015 0923   K 3.7 04/17/2017 0802   K 3.3 (L) 03/28/2015 0923   CL 105 04/17/2017 0802   CL 101 03/28/2015 0923   CO2 25 04/17/2017 0802   CO2 28 03/28/2015 0923   BUN 11 04/17/2017 0802   BUN 10 09/16/2015 0843   BUN 9 03/28/2015 0923   CREATININE 0.70 04/17/2017 0802   CREATININE 0.56 03/28/2015 0923      Component Value Date/Time   CALCIUM 9.3 04/17/2017 0802   CALCIUM 8.6 (L) 03/28/2015 0923   ALKPHOS 130 (H) 04/17/2017 0802   ALKPHOS 85 03/28/2015 0923   AST 39 04/17/2017 0802   AST 43 (H) 03/28/2015 0923   ALT 23 04/17/2017 0802   ALT 26 03/28/2015 0923   BILITOT 0.7 04/17/2017 0802   BILITOT 0.9 09/16/2015 0843   BILITOT 0.6 03/28/2015 0923     IMPRESSION: 1. Overall, today's study demonstrates similar findings to the prior examination from January 2018. Specifically, there is widespread metastatic disease to the bones which appears essentially unchanged. No other sites of extraskeletal metastatic disease are noted on today's examination. 2. Morphologic changes in the liver compatible with underlying cirrhosis redemonstrated. This is associated with evidence of portal hypertension, including a dilated portal vein and splenomegaly. 3. 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 4. Aortic atherosclerosis, in addition to two vessel coronary artery disease. Assessment for potential risk factor modification, dietary therapy or pharmacologic therapy may be warranted, if clinically indicated. 5. Additional incidental findings, as above.   Electronically Signed   By: Vinnie Langton M.D.   On: 03/11/2017 13:28  IMPRESSION: Normal LEFT ventricular ejection fraction of 58%, slightly decreased from the 63% on the prior exam.  Normal LEFT ventricular wall motion.  Mild splenic enlargement.  ASSESSMENT & PLAN:  Carcinoma of upper-outer quadrant of left breast in female, estrogen receptor positive ([place]) Metastatic breast cancer to the bone/lung. On taxol- Herceptin cycle weekly days-1,8,15 q 28days. March 26th 2017- CT scan - significant improvement.  Clinically no progression of disease noted.  # proceed with cycle #7 day 15 today. Tolerating chemo well;. Labs today reviewed;  acceptable for treatment today April 26th -MUGA scan- 58 % [Jan 2018-baseline 63%].   # Tachycardia- EKG revealed sinus tachycardia; with sinus arrhythmia. No new recommendations.  # Bone metastases on Xgeva   # follow up in 2 weeks/MD/chemo.   Orders Placed This Encounter  Procedures  . Comprehensive metabolic panel    Standing Status:   Future    Standing Expiration Date:   04/17/2018  . EKG 12-Lead    Exam room 16 in cancer center    Standing Status:   Future    Number of Occurrences:   1    Standing Expiration Date:   04/17/2018    Scheduling Instructions:     STAT    Order Specific Question:   Where should this test be performed    Answer:   Wyvonna Plum, MD 04/17/2017 4:28 PM

## 2017-04-24 ENCOUNTER — Inpatient Hospital Stay: Payer: Medicare Other

## 2017-04-24 ENCOUNTER — Other Ambulatory Visit: Payer: Medicare Other

## 2017-04-24 ENCOUNTER — Ambulatory Visit: Payer: Medicare Other

## 2017-04-24 ENCOUNTER — Ambulatory Visit: Payer: Medicare Other | Admitting: Internal Medicine

## 2017-04-24 VITALS — BP 144/88 | HR 118 | Temp 97.2°F | Resp 20

## 2017-04-24 DIAGNOSIS — Z170 Estrogen receptor positive status [ER+]: Secondary | ICD-10-CM | POA: Diagnosis not present

## 2017-04-24 DIAGNOSIS — C50412 Malignant neoplasm of upper-outer quadrant of left female breast: Secondary | ICD-10-CM | POA: Diagnosis not present

## 2017-04-24 DIAGNOSIS — Z79810 Long term (current) use of selective estrogen receptor modulators (SERMs): Secondary | ICD-10-CM | POA: Diagnosis not present

## 2017-04-24 DIAGNOSIS — C7800 Secondary malignant neoplasm of unspecified lung: Secondary | ICD-10-CM | POA: Diagnosis not present

## 2017-04-24 DIAGNOSIS — C50611 Malignant neoplasm of axillary tail of right female breast: Secondary | ICD-10-CM

## 2017-04-24 DIAGNOSIS — C50612 Malignant neoplasm of axillary tail of left female breast: Secondary | ICD-10-CM

## 2017-04-24 DIAGNOSIS — C7951 Secondary malignant neoplasm of bone: Secondary | ICD-10-CM

## 2017-04-24 DIAGNOSIS — Z5112 Encounter for antineoplastic immunotherapy: Secondary | ICD-10-CM | POA: Diagnosis not present

## 2017-04-24 DIAGNOSIS — Z5111 Encounter for antineoplastic chemotherapy: Secondary | ICD-10-CM | POA: Diagnosis not present

## 2017-04-24 LAB — CBC WITH DIFFERENTIAL/PLATELET
BASOS ABS: 0.1 10*3/uL (ref 0–0.1)
Basophils Relative: 1 %
Eosinophils Absolute: 0.2 10*3/uL (ref 0–0.7)
Eosinophils Relative: 3 %
HEMATOCRIT: 32.1 % — AB (ref 35.0–47.0)
Hemoglobin: 10.6 g/dL — ABNORMAL LOW (ref 12.0–16.0)
LYMPHS ABS: 1.7 10*3/uL (ref 1.0–3.6)
LYMPHS PCT: 22 %
MCH: 28.2 pg (ref 26.0–34.0)
MCHC: 33 g/dL (ref 32.0–36.0)
MCV: 85.5 fL (ref 80.0–100.0)
MONO ABS: 0.4 10*3/uL (ref 0.2–0.9)
MONOS PCT: 6 %
NEUTROS ABS: 5.3 10*3/uL (ref 1.4–6.5)
Neutrophils Relative %: 68 %
Platelets: 254 10*3/uL (ref 150–440)
RBC: 3.75 MIL/uL — ABNORMAL LOW (ref 3.80–5.20)
RDW: 21 % — AB (ref 11.5–14.5)
WBC: 7.8 10*3/uL (ref 3.6–11.0)

## 2017-04-24 LAB — BASIC METABOLIC PANEL
ANION GAP: 7 (ref 5–15)
BUN: 12 mg/dL (ref 6–20)
CALCIUM: 9.4 mg/dL (ref 8.9–10.3)
CO2: 25 mmol/L (ref 22–32)
Chloride: 106 mmol/L (ref 101–111)
Creatinine, Ser: 0.75 mg/dL (ref 0.44–1.00)
GFR calc Af Amer: >60 mL/min (ref >60)
GFR calc non Af Amer: >60 mL/min (ref >60)
GLUCOSE: 258 mg/dL — AB (ref 65–99)
Potassium: 3.6 mmol/L (ref 3.5–5.1)
Sodium: 138 mmol/L (ref 135–145)

## 2017-04-24 MED ORDER — HEPARIN SOD (PORK) LOCK FLUSH 100 UNIT/ML IV SOLN
500.0000 [IU] | Freq: Once | INTRAVENOUS | Status: AC | PRN
  Administered 2017-04-24: 500 [IU]

## 2017-04-24 MED ORDER — SODIUM CHLORIDE 0.9 % IV SOLN
Freq: Once | INTRAVENOUS | Status: AC
  Administered 2017-04-24: 09:00:00 via INTRAVENOUS
  Filled 2017-04-24: qty 1000

## 2017-04-24 MED ORDER — PACLITAXEL CHEMO INJECTION 300 MG/50ML
80.0000 mg/m2 | Freq: Once | INTRAVENOUS | Status: AC
  Administered 2017-04-24: 138 mg via INTRAVENOUS
  Filled 2017-04-24: qty 23

## 2017-04-24 MED ORDER — DENOSUMAB 120 MG/1.7ML ~~LOC~~ SOLN
120.0000 mg | Freq: Once | SUBCUTANEOUS | Status: AC
  Administered 2017-04-24: 120 mg via SUBCUTANEOUS
  Filled 2017-04-24: qty 1.7

## 2017-04-24 MED ORDER — TRASTUZUMAB CHEMO 150 MG IV SOLR
2.0000 mg/kg | Freq: Once | INTRAVENOUS | Status: AC
  Administered 2017-04-24: 126 mg via INTRAVENOUS
  Filled 2017-04-24: qty 6

## 2017-04-24 MED ORDER — SODIUM CHLORIDE 0.9% FLUSH
10.0000 mL | INTRAVENOUS | Status: DC | PRN
  Administered 2017-04-24: 10 mL
  Filled 2017-04-24: qty 10

## 2017-04-24 MED ORDER — FAMOTIDINE IN NACL 20-0.9 MG/50ML-% IV SOLN
20.0000 mg | Freq: Once | INTRAVENOUS | Status: AC
  Administered 2017-04-24: 20 mg via INTRAVENOUS
  Filled 2017-04-24: qty 50

## 2017-04-24 MED ORDER — SODIUM CHLORIDE 0.9 % IV SOLN
20.0000 mg | Freq: Once | INTRAVENOUS | Status: AC
  Administered 2017-04-24: 20 mg via INTRAVENOUS
  Filled 2017-04-24: qty 2

## 2017-04-24 MED ORDER — DIPHENHYDRAMINE HCL 50 MG/ML IJ SOLN
50.0000 mg | Freq: Once | INTRAMUSCULAR | Status: AC
  Administered 2017-04-24: 50 mg via INTRAVENOUS
  Filled 2017-04-24: qty 1

## 2017-04-24 MED ORDER — ACETAMINOPHEN 325 MG PO TABS
650.0000 mg | ORAL_TABLET | Freq: Once | ORAL | Status: AC
  Administered 2017-04-24: 650 mg via ORAL
  Filled 2017-04-24: qty 2

## 2017-04-24 MED ORDER — HEPARIN SOD (PORK) LOCK FLUSH 100 UNIT/ML IV SOLN
500.0000 [IU] | Freq: Once | INTRAVENOUS | Status: DC
  Filled 2017-04-24: qty 5

## 2017-04-24 MED ORDER — SODIUM CHLORIDE 0.9% FLUSH
10.0000 mL | Freq: Once | INTRAVENOUS | Status: AC
  Administered 2017-04-24: 10 mL via INTRAVENOUS
  Filled 2017-04-24: qty 10

## 2017-04-24 NOTE — Progress Notes (Signed)
Patient noted to have blood glucose of 258 today.  Also, heart rated noted to be 118-121 today.  Okay to proceed with treatment today per Dr. Rogue Bussing.

## 2017-05-01 ENCOUNTER — Inpatient Hospital Stay (HOSPITAL_BASED_OUTPATIENT_CLINIC_OR_DEPARTMENT_OTHER): Payer: Medicare Other | Admitting: Internal Medicine

## 2017-05-01 ENCOUNTER — Inpatient Hospital Stay: Payer: Medicare Other

## 2017-05-01 ENCOUNTER — Other Ambulatory Visit: Payer: Self-pay | Admitting: Internal Medicine

## 2017-05-01 VITALS — BP 148/79 | HR 109 | Temp 97.2°F | Resp 18 | Wt 131.4 lb

## 2017-05-01 DIAGNOSIS — Z5111 Encounter for antineoplastic chemotherapy: Secondary | ICD-10-CM | POA: Diagnosis not present

## 2017-05-01 DIAGNOSIS — Z800 Family history of malignant neoplasm of digestive organs: Secondary | ICD-10-CM

## 2017-05-01 DIAGNOSIS — Z9049 Acquired absence of other specified parts of digestive tract: Secondary | ICD-10-CM

## 2017-05-01 DIAGNOSIS — I700 Atherosclerosis of aorta: Secondary | ICD-10-CM

## 2017-05-01 DIAGNOSIS — Z170 Estrogen receptor positive status [ER+]: Principal | ICD-10-CM

## 2017-05-01 DIAGNOSIS — C50412 Malignant neoplasm of upper-outer quadrant of left female breast: Secondary | ICD-10-CM | POA: Diagnosis not present

## 2017-05-01 DIAGNOSIS — J90 Pleural effusion, not elsewhere classified: Secondary | ICD-10-CM | POA: Diagnosis not present

## 2017-05-01 DIAGNOSIS — R000 Tachycardia, unspecified: Secondary | ICD-10-CM

## 2017-05-01 DIAGNOSIS — R161 Splenomegaly, not elsewhere classified: Secondary | ICD-10-CM

## 2017-05-01 DIAGNOSIS — Z8041 Family history of malignant neoplasm of ovary: Secondary | ICD-10-CM

## 2017-05-01 DIAGNOSIS — C7800 Secondary malignant neoplasm of unspecified lung: Secondary | ICD-10-CM | POA: Diagnosis not present

## 2017-05-01 DIAGNOSIS — Z79899 Other long term (current) drug therapy: Secondary | ICD-10-CM

## 2017-05-01 DIAGNOSIS — C773 Secondary and unspecified malignant neoplasm of axilla and upper limb lymph nodes: Secondary | ICD-10-CM

## 2017-05-01 DIAGNOSIS — E119 Type 2 diabetes mellitus without complications: Secondary | ICD-10-CM

## 2017-05-01 DIAGNOSIS — E785 Hyperlipidemia, unspecified: Secondary | ICD-10-CM | POA: Diagnosis not present

## 2017-05-01 DIAGNOSIS — Z803 Family history of malignant neoplasm of breast: Secondary | ICD-10-CM

## 2017-05-01 DIAGNOSIS — Z7984 Long term (current) use of oral hypoglycemic drugs: Secondary | ICD-10-CM

## 2017-05-01 DIAGNOSIS — Z923 Personal history of irradiation: Secondary | ICD-10-CM

## 2017-05-01 DIAGNOSIS — Z5112 Encounter for antineoplastic immunotherapy: Secondary | ICD-10-CM | POA: Diagnosis not present

## 2017-05-01 DIAGNOSIS — C7951 Secondary malignant neoplasm of bone: Secondary | ICD-10-CM | POA: Diagnosis not present

## 2017-05-01 DIAGNOSIS — Z79810 Long term (current) use of selective estrogen receptor modulators (SERMs): Secondary | ICD-10-CM

## 2017-05-01 DIAGNOSIS — Z7982 Long term (current) use of aspirin: Secondary | ICD-10-CM

## 2017-05-01 DIAGNOSIS — I10 Essential (primary) hypertension: Secondary | ICD-10-CM

## 2017-05-01 LAB — CBC WITH DIFFERENTIAL/PLATELET
BASOS ABS: 0.1 10*3/uL (ref 0–0.1)
Basophils Relative: 1 %
EOS PCT: 9 %
Eosinophils Absolute: 0.3 10*3/uL (ref 0–0.7)
HCT: 30.6 % — ABNORMAL LOW (ref 35.0–47.0)
Hemoglobin: 10.1 g/dL — ABNORMAL LOW (ref 12.0–16.0)
LYMPHS PCT: 33 %
Lymphs Abs: 1.2 10*3/uL (ref 1.0–3.6)
MCH: 28.4 pg (ref 26.0–34.0)
MCHC: 33.2 g/dL (ref 32.0–36.0)
MCV: 85.7 fL (ref 80.0–100.0)
Monocytes Absolute: 0.2 10*3/uL (ref 0.2–0.9)
Monocytes Relative: 7 %
NEUTROS ABS: 1.8 10*3/uL (ref 1.4–6.5)
Neutrophils Relative %: 50 %
PLATELETS: 197 10*3/uL (ref 150–440)
RBC: 3.57 MIL/uL — AB (ref 3.80–5.20)
RDW: 20.7 % — ABNORMAL HIGH (ref 11.5–14.5)
WBC: 3.6 10*3/uL (ref 3.6–11.0)

## 2017-05-01 LAB — COMPREHENSIVE METABOLIC PANEL
ALT: 30 U/L (ref 14–54)
AST: 40 U/L (ref 15–41)
Albumin: 3.7 g/dL (ref 3.5–5.0)
Alkaline Phosphatase: 118 U/L (ref 38–126)
Anion gap: 5 (ref 5–15)
BUN: 9 mg/dL (ref 6–20)
CHLORIDE: 106 mmol/L (ref 101–111)
CO2: 28 mmol/L (ref 22–32)
Calcium: 9.4 mg/dL (ref 8.9–10.3)
Creatinine, Ser: 0.65 mg/dL (ref 0.44–1.00)
GFR calc Af Amer: >60 mL/min (ref >60)
Glucose, Bld: 255 mg/dL — ABNORMAL HIGH (ref 65–99)
Potassium: 4.1 mmol/L (ref 3.5–5.1)
Sodium: 139 mmol/L (ref 135–145)
Total Bilirubin: 0.6 mg/dL (ref 0.3–1.2)
Total Protein: 6.9 g/dL (ref 6.5–8.1)

## 2017-05-01 MED ORDER — FAMOTIDINE IN NACL 20-0.9 MG/50ML-% IV SOLN
20.0000 mg | Freq: Once | INTRAVENOUS | Status: AC
  Administered 2017-05-01: 20 mg via INTRAVENOUS
  Filled 2017-05-01: qty 50

## 2017-05-01 MED ORDER — PACLITAXEL CHEMO INJECTION 300 MG/50ML
80.0000 mg/m2 | Freq: Once | INTRAVENOUS | Status: AC
  Administered 2017-05-01: 138 mg via INTRAVENOUS
  Filled 2017-05-01: qty 23

## 2017-05-01 MED ORDER — SODIUM CHLORIDE 0.9 % IV SOLN
Freq: Once | INTRAVENOUS | Status: AC
  Administered 2017-05-01: 11:00:00 via INTRAVENOUS
  Filled 2017-05-01: qty 1000

## 2017-05-01 MED ORDER — HEPARIN SOD (PORK) LOCK FLUSH 100 UNIT/ML IV SOLN
500.0000 [IU] | Freq: Once | INTRAVENOUS | Status: AC | PRN
Start: 2017-05-01 — End: 2017-05-01
  Administered 2017-05-01: 500 [IU]
  Filled 2017-05-01 (×2): qty 5

## 2017-05-01 MED ORDER — TRASTUZUMAB CHEMO 150 MG IV SOLR
2.0000 mg/kg | Freq: Once | INTRAVENOUS | Status: AC
  Administered 2017-05-01: 126 mg via INTRAVENOUS
  Filled 2017-05-01: qty 6

## 2017-05-01 MED ORDER — SODIUM CHLORIDE 0.9 % IV SOLN
20.0000 mg | Freq: Once | INTRAVENOUS | Status: AC
  Administered 2017-05-01: 20 mg via INTRAVENOUS
  Filled 2017-05-01: qty 2

## 2017-05-01 MED ORDER — ACETAMINOPHEN 325 MG PO TABS
650.0000 mg | ORAL_TABLET | Freq: Once | ORAL | Status: AC
  Administered 2017-05-01: 650 mg via ORAL
  Filled 2017-05-01: qty 2

## 2017-05-01 MED ORDER — DIPHENHYDRAMINE HCL 50 MG/ML IJ SOLN
50.0000 mg | Freq: Once | INTRAMUSCULAR | Status: AC
  Administered 2017-05-01: 50 mg via INTRAVENOUS
  Filled 2017-05-01: qty 1

## 2017-05-01 NOTE — Assessment & Plan Note (Addendum)
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 On taxol- Herceptin cycle weekly days-1,8,15 q 28days. March 26th 2017- CT scan - significant improvement.  Clinically no progression of disease noted.  # proceed with cycle #8 day 15 today. Tolerating chemo well;. Labs today reviewed;  acceptable for treatment today April 26th -MUGA scan- 58 % [Jan 2018-baseline 63%].   # Tachycardia- [May 2409] EKG revealed sinus tachycardia; with sinus arrhythmia; currently improved.  # Bone metastases on X-geva [last 4/4]; will repeat it next visit.  # follow up with me in 2 weeks;chemo;labs.  X-MD in 4 weeks.

## 2017-05-01 NOTE — Progress Notes (Signed)
[place] OFFICE PROGRESS NOTE  Patient Care Team: Kathrine Haddock, NP as PCP - General (Nurse Practitioner) Bary Castilla Forest Gleason, MD (General Surgery) Forest Gleason, MD (Oncology)  Breast cancer [facility])   Staging form: Breast, AJCC 7th Edition     Clinical stage from 04/06/2015: Stage IV (T2, N2, M1) - Signed by Evlyn Kanner, NP on 04/06/2015    Oncology History   1.history of carcinoma of right breast status post lumpectomy and radiation therapy (upper and outer quadrant) February 21, 2009.  Estrogen receptor 90%.  Progesterone receptor 60%.  Patient had lumpectomy and MammoSite partial breast radiation therapy followed by tamoxifen for 5 years.  Patient is still taking tamoxifen.. 2 recent mammogram in December of 2015 negative.  Further evaluation by surgeon revealed a palpable mass in the left upper outer quadrant near axilla.  Ultrasound revealed one small normal-appearing lymph node 0.6 cm in lower limit of axilla.  Adjacent to this there was ill-defined multilobulated hypoechoic mass 1.8 x 2.6 cm.  Biopsy of the left axillary mass was done which was positive for high-grade carcinoma.   2,estrogen receptor positive.  Progesterone receptor positive.  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 3.  MRI scan of breast (January, 2015) reveals multiple abnormal enlarged left axillary lymph node There are 2 oval some circumscribed mass at 2:30 and 3:00 position of the left breast.  0.6 cm x 1.3 cm and 4 x 7 mm Non-mass enhancement extending 2.4 cm these masses is 3.6 cm mass.  Findings suggestive of several pulmonary nodules.  PET  scan shows extensive disease, 4.started on Taxotere,perjeta, Herceptin(January 03, 2015- June 2016.   # June 2016- Herceptin [Letrozole + Ibrance] until May 2017.   # MAY 2017- PET PROGRESSION; # July 2017- START KADCYLA  x4 cycles- SEP 15th 2017CT- PROGRESSION  # SEP 2017- START Taxol + Herceptin.   # Right sided Pleural effusion [s/p Thora Sep  2017]  # Bone mets- X-geva q 6W  # MUGA Jan 18th-63%     Carcinoma of upper-outer quadrant of left breast in female, estrogen receptor positive ([place])   09/28/2016 Initial Diagnosis    Carcinoma of upper-outer quadrant of left breast in female, estrogen receptor positive ([place])       INTERVAL HISTORY:  Tammy Pope 71 y.o.  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  As per the husband patient has been doing well. She continues to deny any worsening shortness of breath or cough. Denies any chest pain. No headache. No jaw pain. Denies any new lumps or bumps. Denies any tingling or numbness. No falls. No swelling in the legs.   REVIEW OF SYSTEMS:  A complete 10 point review of system is done which is negative except mentioned above/history of present illness.   PAST MEDICAL HISTORY :  Past Medical History:  Diagnosis Date  . Axillary mass 12/15/2014  . Breast cancer ([place]) 02/21/2009  . Cancer [facility]) 2010   right breast ductal carcinoma in situ  . Diabetes mellitus without complication ([place])   . Floaters 2014  . Hyperlipidemia 1990  . Hypertension 2009  . Malignant neoplasm of axillary tail of female breast ([place]) 12/16/2014  . Malignant neoplasm of upper-outer quadrant of female breast ([place]) 02/21/2009   DCIS of the right breast, intermediate grade, resected the negative margins. ER 90%, PR 50%, MammoSite partial  breast radiation  . 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 Special screening for malignant neoplasms, colon     PAST SURGICAL HISTORY :   Past Surgical History:  Procedure Laterality Date  . APPENDECTOMY  1956  . BREAST BIOPSY Right 2010   stereo  . BREAST SURGERY Right 2010   wide excision  . COLONOSCOPY  2008   ??? Md  . left axilla biopsy Left 12-14-14  . PARTIAL  HYSTERECTOMY  1977    FAMILY HISTORY :   Family History  Problem Relation Age of Onset  . Cancer Other        unknown family members with ovarian,colon,breast cancers  . Hypertension Mother   . Heart disease Daughter        MI    SOCIAL HISTORY:   Social History  Substance Use Topics  . Smoking status: Never Smoker  . Smokeless tobacco: Never Used  . Alcohol use No    ALLERGIES:  is allergic to penicillin g and sulfa antibiotics.  MEDICATIONS:  Current Outpatient Prescriptions  Medication Sig Dispense Refill  . amLODipine (NORVASC) 5 MG tablet Take 1 tablet (5 mg total) by mouth daily. 90 tablet 3  . aspirin 81 MG tablet Take 81 mg by mouth daily.    . Calcium 600-200 MG-UNIT tablet Take 1 tablet by mouth 2 (two) times daily. 120 tablet 3  . metFORMIN (GLUCOPHAGE) 500 MG tablet Take 2 tablets (1,000 mg total) by mouth 2 (two) times daily with a meal. 360 tablet 5  . Multiple Vitamins-Minerals (MULTIVITAMIN ADULT PO) Take by mouth.    . potassium chloride SA (KLOR-CON M20) 20 MEQ tablet Take 2 tablets (40 mEq total) by mouth daily. 60 tablet 3  . rosuvastatin (CRESTOR) 5 MG tablet Take 1 tablet (5 mg total) by mouth at bedtime. 90 tablet 1  . valsartan-hydrochlorothiazide (DIOVAN-HCT) 320-25 MG tablet Take 1 tablet by mouth daily. 90 tablet 3  . ALPRAZolam (XANAX) 0.5 MG tablet TAKE ONE TABLET BY MOUTH 3 TIMES DAILY AS NEEDED 90 tablet 1  . ONE TOUCH ULTRA TEST test strip      No current facility-administered medications for this visit.    Facility-Administered Medications Ordered in Other Visits  Medication Dose Route Frequency Provider Last Rate Last Dose  . heparin lock flush 100 unit/mL  250 Units Intracatheter PRN Choksi, Janak, MD      . sodium chloride 0.9 % 250 mL with potassium chloride 40 mEq infusion   Intravenous Continuous Forest Gleason, MD   Stopped at 05/03/15 1229  . sodium chloride 0.9 % injection 10 mL  10 mL Intracatheter PRN Forest Gleason, MD   10 mL at  04/18/15 1545    PHYSICAL EXAMINATION: ECOG PERFORMANCE STATUS: 0 - Asymptomatic  BP (!) 148/79 (BP Location: Left Arm, Patient Position: Sitting)   Pulse (!) 109   Temp 97.2 F (36.2 C) (Tympanic)   Resp 18   Wt 131 lb 6 oz (59.6 kg)   LMP  (LMP Unknown)   BMI 23.27 kg/m   Filed Weights   05/01/17 0859  Weight: 131 lb 6 oz (59.6 kg)    GENERAL: Well-nourished well-developed; Alert, no distress and comfortable.   Accompanied by her husband.  EYES: no pallor or icterus OROPHARYNX: no thrush or ulceration; poor dentition  NECK: supple, no masses felt LYMPH:  no palpable lymphadenopathy in the cervical, axillary or inguinal regions LUNGS: clear to auscultation and  No wheeze or  crackles HEART/CVS: regular rate & rhythm and no murmurs; No lower extremity edema ABDOMEN:abdomen soft, non-tender and normal bowel sounds Musculoskeletal:no cyanosis of digits and no clubbing  PSYCH: alert & oriented x 3 with fluent speech NEURO: no focal motor/sensory deficits SKIN:  no rashes or significant lesions LEFT BREAST- 1-2cm mass left upper outer quadrant/axillary tail. Nontender. Skin: macular rash noted on the bilateral extremities  LABORATORY DATA:  I have reviewed the data as listed    Component Value Date/Time   NA 139 05/01/2017 0819   NA 136 09/16/2015 0843   NA 138 03/28/2015 0923   K 4.1 05/01/2017 0819   K 3.3 (L) 03/28/2015 0923   CL 106 05/01/2017 0819   CL 101 03/28/2015 0923   CO2 28 05/01/2017 0819   CO2 28 03/28/2015 0923   GLUCOSE 255 (H) 05/01/2017 0819   GLUCOSE 173 (H) 03/28/2015 0923   BUN 9 05/01/2017 0819   BUN 10 09/16/2015 0843   BUN 9 03/28/2015 0923   CREATININE 0.65 05/01/2017 0819   CREATININE 0.56 03/28/2015 0923   CALCIUM 9.4 05/01/2017 0819   CALCIUM 8.6 (L) 03/28/2015 0923   PROT 6.9 05/01/2017 0819   PROT 7.2 09/16/2015 0843   PROT 7.1 03/28/2015 0923   ALBUMIN 3.7 05/01/2017 0819   ALBUMIN 4.6 09/16/2015 0843   ALBUMIN 3.6 03/28/2015  0923   AST 40 05/01/2017 0819   AST 43 (H) 03/28/2015 0923   ALT 30 05/01/2017 0819   ALT 26 03/28/2015 0923   ALKPHOS 118 05/01/2017 0819   ALKPHOS 85 03/28/2015 0923   BILITOT 0.6 05/01/2017 0819   BILITOT 0.9 09/16/2015 0843   BILITOT 0.6 03/28/2015 0923   GFRNONAA >60 05/01/2017 0819   GFRNONAA >60 03/28/2015 0923   GFRAA >60 05/01/2017 0819   GFRAA >60 03/28/2015 0923    No results found for: SPEP, UPEP  Lab Results  Component Value Date   WBC 3.6 05/01/2017   NEUTROABS 1.8 05/01/2017   HGB 10.1 (L) 05/01/2017   HCT 30.6 (L) 05/01/2017   MCV 85.7 05/01/2017   PLT 197 05/01/2017      Chemistry      Component Value Date/Time   NA 139 05/01/2017 0819   NA 136 09/16/2015 0843   NA 138 03/28/2015 0923   K 4.1 05/01/2017 0819   K 3.3 (L) 03/28/2015 0923   CL 106 05/01/2017 0819   CL 101 03/28/2015 0923   CO2 28 05/01/2017 0819   CO2 28 03/28/2015 0923   BUN 9 05/01/2017 0819   BUN 10 09/16/2015 0843   BUN 9 03/28/2015 0923   CREATININE 0.65 05/01/2017 0819   CREATININE 0.56 03/28/2015 0923      Component Value Date/Time   CALCIUM 9.4 05/01/2017 0819   CALCIUM 8.6 (L) 03/28/2015 0923   ALKPHOS 118 05/01/2017 0819   ALKPHOS 85 03/28/2015 0923   AST 40 05/01/2017 0819   AST 43 (H) 03/28/2015 0923   ALT 30 05/01/2017 0819   ALT 26 03/28/2015 0923   BILITOT 0.6 05/01/2017 0819   BILITOT 0.9 09/16/2015 0843   BILITOT 0.6 03/28/2015 0923     IMPRESSION: 1. Overall, today's study demonstrates similar findings to the prior examination from January 2018. Specifically, there is widespread metastatic disease to the bones which appears essentially unchanged. No other sites of extraskeletal metastatic disease are noted on today's examination. 2. Morphologic changes in the liver compatible with underlying cirrhosis redemonstrated. This is associated with evidence of portal hypertension, including a  dilated portal vein and splenomegaly. 3. 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 4. Aortic atherosclerosis, in addition to two vessel coronary artery disease. Assessment for potential risk factor modification, dietary therapy or pharmacologic therapy may be warranted, if clinically indicated. 5. Additional incidental findings, as above.   Electronically Signed   By: Vinnie Langton M.D.   On: 03/11/2017 13:28  IMPRESSION: Normal LEFT ventricular ejection fraction of 58%, slightly decreased from the 63% on the prior exam.  Normal LEFT ventricular wall motion.  Mild splenic enlargement.  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 On taxol- Herceptin cycle weekly days-1,8,15 q 28days. March 26th 2017- CT scan - significant improvement.  Clinically no progression of disease noted.  # proceed with cycle #8 day 15 today. Tolerating chemo well;. Labs today reviewed;  acceptable for treatment today April 26th -MUGA scan- 58 % [Jan 2018-baseline 63%].   # Tachycardia- [May 8830] EKG revealed sinus tachycardia; with sinus arrhythmia; currently improved.  # Bone metastases on X-geva [last 4/4]; will repeat it next visit.  # follow up with me in 2 weeks;chemo;labs.  X-MD in 4 weeks.  Orders Placed This Encounter  Procedures  . Comprehensive metabolic panel    Standing Status:   Standing    Number of Occurrences:   15    Standing Expiration Date:   05/01/2018       Cammie Sickle, MD 05/05/2017 7:16 PM

## 2017-05-01 NOTE — Progress Notes (Signed)
Patient here today for follow up.  Patient states no new concerns today  

## 2017-05-02 ENCOUNTER — Other Ambulatory Visit: Payer: Self-pay | Admitting: Internal Medicine

## 2017-05-15 ENCOUNTER — Other Ambulatory Visit: Payer: Self-pay | Admitting: Internal Medicine

## 2017-05-15 ENCOUNTER — Inpatient Hospital Stay: Payer: Medicare Other

## 2017-05-15 VITALS — BP 146/84 | HR 112 | Temp 97.0°F | Resp 18

## 2017-05-15 DIAGNOSIS — C7800 Secondary malignant neoplasm of unspecified lung: Secondary | ICD-10-CM | POA: Diagnosis not present

## 2017-05-15 DIAGNOSIS — Z5112 Encounter for antineoplastic immunotherapy: Secondary | ICD-10-CM | POA: Diagnosis not present

## 2017-05-15 DIAGNOSIS — Z170 Estrogen receptor positive status [ER+]: Principal | ICD-10-CM

## 2017-05-15 DIAGNOSIS — Z79810 Long term (current) use of selective estrogen receptor modulators (SERMs): Secondary | ICD-10-CM | POA: Diagnosis not present

## 2017-05-15 DIAGNOSIS — C50412 Malignant neoplasm of upper-outer quadrant of left female breast: Secondary | ICD-10-CM

## 2017-05-15 DIAGNOSIS — Z5111 Encounter for antineoplastic chemotherapy: Secondary | ICD-10-CM | POA: Diagnosis not present

## 2017-05-15 LAB — COMPREHENSIVE METABOLIC PANEL
ALT: 25 U/L (ref 14–54)
AST: 41 U/L (ref 15–41)
Albumin: 3.8 g/dL (ref 3.5–5.0)
Alkaline Phosphatase: 122 U/L (ref 38–126)
Anion gap: 8 (ref 5–15)
BILIRUBIN TOTAL: 0.5 mg/dL (ref 0.3–1.2)
BUN: 10 mg/dL (ref 6–20)
CHLORIDE: 106 mmol/L (ref 101–111)
CO2: 25 mmol/L (ref 22–32)
Calcium: 9.4 mg/dL (ref 8.9–10.3)
Creatinine, Ser: 0.75 mg/dL (ref 0.44–1.00)
Glucose, Bld: 213 mg/dL — ABNORMAL HIGH (ref 65–99)
Potassium: 3.9 mmol/L (ref 3.5–5.1)
Sodium: 139 mmol/L (ref 135–145)
TOTAL PROTEIN: 6.9 g/dL (ref 6.5–8.1)

## 2017-05-15 LAB — CBC WITH DIFFERENTIAL/PLATELET
BASOS ABS: 0 10*3/uL (ref 0–0.1)
Basophils Relative: 1 %
Eosinophils Absolute: 0.1 10*3/uL (ref 0–0.7)
Eosinophils Relative: 3 %
HCT: 31.6 % — ABNORMAL LOW (ref 35.0–47.0)
Hemoglobin: 10.3 g/dL — ABNORMAL LOW (ref 12.0–16.0)
LYMPHS ABS: 1.3 10*3/uL (ref 1.0–3.6)
Lymphocytes Relative: 33 %
MCH: 27.6 pg (ref 26.0–34.0)
MCHC: 32.7 g/dL (ref 32.0–36.0)
MCV: 84.7 fL (ref 80.0–100.0)
MONO ABS: 0.4 10*3/uL (ref 0.2–0.9)
MONOS PCT: 11 %
Neutro Abs: 2.1 10*3/uL (ref 1.4–6.5)
Neutrophils Relative %: 52 %
PLATELETS: 200 10*3/uL (ref 150–440)
RBC: 3.73 MIL/uL — ABNORMAL LOW (ref 3.80–5.20)
RDW: 21.1 % — AB (ref 11.5–14.5)
WBC: 4 10*3/uL (ref 3.6–11.0)

## 2017-05-15 MED ORDER — DIPHENHYDRAMINE HCL 50 MG/ML IJ SOLN
50.0000 mg | Freq: Once | INTRAMUSCULAR | Status: AC
  Administered 2017-05-15: 50 mg via INTRAVENOUS
  Filled 2017-05-15: qty 1

## 2017-05-15 MED ORDER — SODIUM CHLORIDE 0.9% FLUSH
10.0000 mL | INTRAVENOUS | Status: DC | PRN
  Administered 2017-05-15: 10 mL via INTRAVENOUS
  Filled 2017-05-15: qty 10

## 2017-05-15 MED ORDER — HEPARIN SOD (PORK) LOCK FLUSH 100 UNIT/ML IV SOLN
500.0000 [IU] | Freq: Once | INTRAVENOUS | Status: AC
  Administered 2017-05-15: 500 [IU] via INTRAVENOUS

## 2017-05-15 MED ORDER — ACETAMINOPHEN 325 MG PO TABS
650.0000 mg | ORAL_TABLET | Freq: Once | ORAL | Status: AC
  Administered 2017-05-15: 650 mg via ORAL
  Filled 2017-05-15: qty 2

## 2017-05-15 MED ORDER — PACLITAXEL CHEMO INJECTION 300 MG/50ML
80.0000 mg/m2 | Freq: Once | INTRAVENOUS | Status: AC
  Administered 2017-05-15: 138 mg via INTRAVENOUS
  Filled 2017-05-15: qty 23

## 2017-05-15 MED ORDER — SODIUM CHLORIDE 0.9 % IV SOLN
Freq: Once | INTRAVENOUS | Status: AC
  Administered 2017-05-15: 10:00:00 via INTRAVENOUS
  Filled 2017-05-15: qty 1000

## 2017-05-15 MED ORDER — FAMOTIDINE IN NACL 20-0.9 MG/50ML-% IV SOLN
20.0000 mg | Freq: Once | INTRAVENOUS | Status: AC
  Administered 2017-05-15: 20 mg via INTRAVENOUS
  Filled 2017-05-15: qty 50

## 2017-05-15 MED ORDER — TRASTUZUMAB CHEMO 150 MG IV SOLR
2.0000 mg/kg | Freq: Once | INTRAVENOUS | Status: AC
  Administered 2017-05-15: 126 mg via INTRAVENOUS
  Filled 2017-05-15: qty 6

## 2017-05-15 MED ORDER — SODIUM CHLORIDE 0.9 % IV SOLN
20.0000 mg | Freq: Once | INTRAVENOUS | Status: AC
  Administered 2017-05-15: 20 mg via INTRAVENOUS
  Filled 2017-05-15: qty 2

## 2017-05-22 ENCOUNTER — Inpatient Hospital Stay: Payer: Medicare Other

## 2017-05-22 ENCOUNTER — Inpatient Hospital Stay: Payer: Medicare Other | Attending: Internal Medicine

## 2017-05-22 VITALS — BP 170/80 | HR 85 | Temp 97.0°F | Resp 18

## 2017-05-22 DIAGNOSIS — Z79810 Long term (current) use of selective estrogen receptor modulators (SERMs): Secondary | ICD-10-CM | POA: Diagnosis not present

## 2017-05-22 DIAGNOSIS — G629 Polyneuropathy, unspecified: Secondary | ICD-10-CM | POA: Insufficient documentation

## 2017-05-22 DIAGNOSIS — I10 Essential (primary) hypertension: Secondary | ICD-10-CM | POA: Diagnosis not present

## 2017-05-22 DIAGNOSIS — Z923 Personal history of irradiation: Secondary | ICD-10-CM | POA: Insufficient documentation

## 2017-05-22 DIAGNOSIS — Z7984 Long term (current) use of oral hypoglycemic drugs: Secondary | ICD-10-CM | POA: Insufficient documentation

## 2017-05-22 DIAGNOSIS — E119 Type 2 diabetes mellitus without complications: Secondary | ICD-10-CM | POA: Insufficient documentation

## 2017-05-22 DIAGNOSIS — R161 Splenomegaly, not elsewhere classified: Secondary | ICD-10-CM | POA: Insufficient documentation

## 2017-05-22 DIAGNOSIS — Z170 Estrogen receptor positive status [ER+]: Secondary | ICD-10-CM | POA: Diagnosis not present

## 2017-05-22 DIAGNOSIS — I700 Atherosclerosis of aorta: Secondary | ICD-10-CM | POA: Insufficient documentation

## 2017-05-22 DIAGNOSIS — Z7982 Long term (current) use of aspirin: Secondary | ICD-10-CM | POA: Diagnosis not present

## 2017-05-22 DIAGNOSIS — Z79899 Other long term (current) drug therapy: Secondary | ICD-10-CM | POA: Diagnosis not present

## 2017-05-22 DIAGNOSIS — E785 Hyperlipidemia, unspecified: Secondary | ICD-10-CM | POA: Insufficient documentation

## 2017-05-22 DIAGNOSIS — C50412 Malignant neoplasm of upper-outer quadrant of left female breast: Secondary | ICD-10-CM

## 2017-05-22 DIAGNOSIS — D649 Anemia, unspecified: Secondary | ICD-10-CM | POA: Insufficient documentation

## 2017-05-22 DIAGNOSIS — Z5111 Encounter for antineoplastic chemotherapy: Secondary | ICD-10-CM | POA: Insufficient documentation

## 2017-05-22 DIAGNOSIS — R000 Tachycardia, unspecified: Secondary | ICD-10-CM | POA: Diagnosis not present

## 2017-05-22 DIAGNOSIS — C7951 Secondary malignant neoplasm of bone: Secondary | ICD-10-CM | POA: Insufficient documentation

## 2017-05-22 LAB — COMPREHENSIVE METABOLIC PANEL
ALK PHOS: 103 U/L (ref 38–126)
ALT: 24 U/L (ref 14–54)
ANION GAP: 6 (ref 5–15)
AST: 40 U/L (ref 15–41)
Albumin: 3.7 g/dL (ref 3.5–5.0)
BUN: 11 mg/dL (ref 6–20)
CALCIUM: 9.6 mg/dL (ref 8.9–10.3)
CO2: 27 mmol/L (ref 22–32)
CREATININE: 0.66 mg/dL (ref 0.44–1.00)
Chloride: 106 mmol/L (ref 101–111)
Glucose, Bld: 208 mg/dL — ABNORMAL HIGH (ref 65–99)
Potassium: 3.6 mmol/L (ref 3.5–5.1)
Sodium: 139 mmol/L (ref 135–145)
TOTAL PROTEIN: 6.8 g/dL (ref 6.5–8.1)
Total Bilirubin: 0.7 mg/dL (ref 0.3–1.2)

## 2017-05-22 LAB — CBC WITH DIFFERENTIAL/PLATELET
Basophils Absolute: 0.1 10*3/uL (ref 0–0.1)
Basophils Relative: 1 %
EOS PCT: 4 %
Eosinophils Absolute: 0.2 10*3/uL (ref 0–0.7)
HCT: 31 % — ABNORMAL LOW (ref 35.0–47.0)
HEMOGLOBIN: 10.2 g/dL — AB (ref 12.0–16.0)
LYMPHS ABS: 1.3 10*3/uL (ref 1.0–3.6)
LYMPHS PCT: 24 %
MCH: 27.7 pg (ref 26.0–34.0)
MCHC: 32.9 g/dL (ref 32.0–36.0)
MCV: 84.1 fL (ref 80.0–100.0)
MONOS PCT: 5 %
Monocytes Absolute: 0.3 10*3/uL (ref 0.2–0.9)
NEUTROS PCT: 66 %
Neutro Abs: 3.7 10*3/uL (ref 1.4–6.5)
Platelets: 206 10*3/uL (ref 150–440)
RBC: 3.68 MIL/uL — AB (ref 3.80–5.20)
RDW: 20.2 % — ABNORMAL HIGH (ref 11.5–14.5)
WBC: 5.5 10*3/uL (ref 3.6–11.0)

## 2017-05-22 MED ORDER — SODIUM CHLORIDE 0.9 % IV SOLN
Freq: Once | INTRAVENOUS | Status: AC
  Administered 2017-05-22: 10:00:00 via INTRAVENOUS
  Filled 2017-05-22: qty 1000

## 2017-05-22 MED ORDER — DIPHENHYDRAMINE HCL 50 MG/ML IJ SOLN
50.0000 mg | Freq: Once | INTRAMUSCULAR | Status: AC
  Administered 2017-05-22: 50 mg via INTRAVENOUS
  Filled 2017-05-22: qty 1

## 2017-05-22 MED ORDER — FAMOTIDINE IN NACL 20-0.9 MG/50ML-% IV SOLN
20.0000 mg | Freq: Once | INTRAVENOUS | Status: AC
  Administered 2017-05-22: 20 mg via INTRAVENOUS
  Filled 2017-05-22: qty 50

## 2017-05-22 MED ORDER — SODIUM CHLORIDE 0.9 % IV SOLN
20.0000 mg | Freq: Once | INTRAVENOUS | Status: AC
  Administered 2017-05-22: 20 mg via INTRAVENOUS
  Filled 2017-05-22: qty 2

## 2017-05-22 MED ORDER — TRASTUZUMAB CHEMO 150 MG IV SOLR
2.0000 mg/kg | Freq: Once | INTRAVENOUS | Status: AC
  Administered 2017-05-22: 126 mg via INTRAVENOUS
  Filled 2017-05-22: qty 6

## 2017-05-22 MED ORDER — HEPARIN SOD (PORK) LOCK FLUSH 100 UNIT/ML IV SOLN
500.0000 [IU] | Freq: Once | INTRAVENOUS | Status: AC | PRN
  Administered 2017-05-22: 500 [IU]
  Filled 2017-05-22: qty 5

## 2017-05-22 MED ORDER — PACLITAXEL CHEMO INJECTION 300 MG/50ML
80.0000 mg/m2 | Freq: Once | INTRAVENOUS | Status: AC
  Administered 2017-05-22: 138 mg via INTRAVENOUS
  Filled 2017-05-22: qty 23

## 2017-05-22 MED ORDER — ACETAMINOPHEN 325 MG PO TABS
650.0000 mg | ORAL_TABLET | Freq: Once | ORAL | Status: AC
  Administered 2017-05-22: 650 mg via ORAL
  Filled 2017-05-22: qty 2

## 2017-05-23 ENCOUNTER — Encounter: Payer: Self-pay | Admitting: *Deleted

## 2017-05-23 NOTE — Progress Notes (Signed)
Reviewed chart- pt needs to be added to covering provider's schedule next Wednesday per Dr. Aletha Halim note and also need to be added for possible xgeva.  msg sent to scheduling team to add these additional apts.

## 2017-05-29 ENCOUNTER — Inpatient Hospital Stay: Payer: Medicare Other

## 2017-05-29 VITALS — BP 155/87 | HR 90 | Temp 98.8°F | Resp 18

## 2017-05-29 DIAGNOSIS — C50612 Malignant neoplasm of axillary tail of left female breast: Secondary | ICD-10-CM

## 2017-05-29 DIAGNOSIS — G629 Polyneuropathy, unspecified: Secondary | ICD-10-CM | POA: Diagnosis not present

## 2017-05-29 DIAGNOSIS — C50412 Malignant neoplasm of upper-outer quadrant of left female breast: Secondary | ICD-10-CM | POA: Diagnosis not present

## 2017-05-29 DIAGNOSIS — C7951 Secondary malignant neoplasm of bone: Secondary | ICD-10-CM | POA: Diagnosis not present

## 2017-05-29 DIAGNOSIS — Z170 Estrogen receptor positive status [ER+]: Principal | ICD-10-CM

## 2017-05-29 DIAGNOSIS — Z5111 Encounter for antineoplastic chemotherapy: Secondary | ICD-10-CM | POA: Diagnosis not present

## 2017-05-29 DIAGNOSIS — Z79810 Long term (current) use of selective estrogen receptor modulators (SERMs): Secondary | ICD-10-CM | POA: Diagnosis not present

## 2017-05-29 DIAGNOSIS — C50611 Malignant neoplasm of axillary tail of right female breast: Secondary | ICD-10-CM

## 2017-05-29 LAB — CBC WITH DIFFERENTIAL/PLATELET
BASOS ABS: 0 10*3/uL (ref 0–0.1)
Basophils Relative: 1 %
EOS ABS: 0.1 10*3/uL (ref 0–0.7)
Eosinophils Relative: 6 %
HCT: 30.1 % — ABNORMAL LOW (ref 35.0–47.0)
HEMOGLOBIN: 9.9 g/dL — AB (ref 12.0–16.0)
LYMPHS PCT: 42 %
Lymphs Abs: 1.1 10*3/uL (ref 1.0–3.6)
MCH: 27.6 pg (ref 26.0–34.0)
MCHC: 33 g/dL (ref 32.0–36.0)
MCV: 83.7 fL (ref 80.0–100.0)
Monocytes Absolute: 0.2 10*3/uL (ref 0.2–0.9)
Monocytes Relative: 6 %
NEUTROS PCT: 45 %
Neutro Abs: 1.2 10*3/uL — ABNORMAL LOW (ref 1.4–6.5)
PLATELETS: 166 10*3/uL (ref 150–440)
RBC: 3.59 MIL/uL — AB (ref 3.80–5.20)
RDW: 21.5 % — ABNORMAL HIGH (ref 11.5–14.5)
WBC: 2.7 10*3/uL — AB (ref 3.6–11.0)

## 2017-05-29 LAB — COMPREHENSIVE METABOLIC PANEL
ALT: 24 U/L (ref 14–54)
AST: 36 U/L (ref 15–41)
Albumin: 3.7 g/dL (ref 3.5–5.0)
Alkaline Phosphatase: 102 U/L (ref 38–126)
Anion gap: 9 (ref 5–15)
BUN: 11 mg/dL (ref 6–20)
CHLORIDE: 103 mmol/L (ref 101–111)
CO2: 26 mmol/L (ref 22–32)
CREATININE: 0.62 mg/dL (ref 0.44–1.00)
Calcium: 9.4 mg/dL (ref 8.9–10.3)
GFR calc Af Amer: >60 mL/min (ref >60)
GFR calc non Af Amer: >60 mL/min (ref >60)
GLUCOSE: 245 mg/dL — AB (ref 65–99)
Potassium: 3.8 mmol/L (ref 3.5–5.1)
SODIUM: 138 mmol/L (ref 135–145)
Total Bilirubin: 0.4 mg/dL (ref 0.3–1.2)
Total Protein: 6.6 g/dL (ref 6.5–8.1)

## 2017-05-29 MED ORDER — ACETAMINOPHEN 325 MG PO TABS
650.0000 mg | ORAL_TABLET | Freq: Once | ORAL | Status: AC
  Administered 2017-05-29: 650 mg via ORAL
  Filled 2017-05-29: qty 2

## 2017-05-29 MED ORDER — DENOSUMAB 120 MG/1.7ML ~~LOC~~ SOLN
120.0000 mg | Freq: Once | SUBCUTANEOUS | Status: AC
  Administered 2017-05-29: 120 mg via SUBCUTANEOUS
  Filled 2017-05-29: qty 1.7

## 2017-05-29 MED ORDER — TRASTUZUMAB CHEMO 150 MG IV SOLR
2.0000 mg/kg | Freq: Once | INTRAVENOUS | Status: AC
  Administered 2017-05-29: 126 mg via INTRAVENOUS
  Filled 2017-05-29: qty 6

## 2017-05-29 MED ORDER — DIPHENHYDRAMINE HCL 50 MG/ML IJ SOLN
50.0000 mg | Freq: Once | INTRAMUSCULAR | Status: AC
  Administered 2017-05-29: 50 mg via INTRAVENOUS
  Filled 2017-05-29: qty 1

## 2017-05-29 MED ORDER — PACLITAXEL CHEMO INJECTION 300 MG/50ML
80.0000 mg/m2 | Freq: Once | INTRAVENOUS | Status: AC
  Administered 2017-05-29: 138 mg via INTRAVENOUS
  Filled 2017-05-29: qty 23

## 2017-05-29 MED ORDER — HEPARIN SOD (PORK) LOCK FLUSH 100 UNIT/ML IV SOLN
500.0000 [IU] | Freq: Once | INTRAVENOUS | Status: AC
  Administered 2017-05-29: 500 [IU] via INTRAVENOUS
  Filled 2017-05-29: qty 5

## 2017-05-29 MED ORDER — SODIUM CHLORIDE 0.9 % IV SOLN
Freq: Once | INTRAVENOUS | Status: AC
  Administered 2017-05-29: 10:00:00 via INTRAVENOUS
  Filled 2017-05-29: qty 1000

## 2017-05-29 MED ORDER — SODIUM CHLORIDE 0.9% FLUSH
10.0000 mL | INTRAVENOUS | Status: DC | PRN
  Administered 2017-05-29: 10 mL via INTRAVENOUS
  Filled 2017-05-29: qty 10

## 2017-05-29 MED ORDER — SODIUM CHLORIDE 0.9 % IV SOLN
20.0000 mg | Freq: Once | INTRAVENOUS | Status: AC
  Administered 2017-05-29: 20 mg via INTRAVENOUS
  Filled 2017-05-29: qty 2

## 2017-05-29 MED ORDER — FAMOTIDINE IN NACL 20-0.9 MG/50ML-% IV SOLN
20.0000 mg | Freq: Once | INTRAVENOUS | Status: AC
  Administered 2017-05-29: 20 mg via INTRAVENOUS
  Filled 2017-05-29: qty 50

## 2017-05-29 NOTE — Progress Notes (Signed)
ANC: 1200. MD, Dr. Grayland Ormond, notified via telephone. Per MD order: proceed with scheduled treatment today.

## 2017-06-03 ENCOUNTER — Encounter: Payer: Self-pay | Admitting: Unknown Physician Specialty

## 2017-06-03 ENCOUNTER — Ambulatory Visit (INDEPENDENT_AMBULATORY_CARE_PROVIDER_SITE_OTHER): Payer: Medicare Other | Admitting: Unknown Physician Specialty

## 2017-06-03 VITALS — BP 127/82 | HR 116 | Temp 98.4°F | Ht 62.6 in | Wt 129.6 lb

## 2017-06-03 DIAGNOSIS — E782 Mixed hyperlipidemia: Secondary | ICD-10-CM

## 2017-06-03 DIAGNOSIS — I10 Essential (primary) hypertension: Secondary | ICD-10-CM | POA: Diagnosis not present

## 2017-06-03 DIAGNOSIS — E1122 Type 2 diabetes mellitus with diabetic chronic kidney disease: Secondary | ICD-10-CM

## 2017-06-03 DIAGNOSIS — I499 Cardiac arrhythmia, unspecified: Secondary | ICD-10-CM

## 2017-06-03 LAB — BAYER DCA HB A1C WAIVED: HB A1C (BAYER DCA - WAIVED): 6.5 % (ref <7.0)

## 2017-06-03 NOTE — Progress Notes (Signed)
BP 127/82 (BP Location: Left Arm, Cuff Size: Small)   Pulse (!) 116   Temp 98.4 F (36.9 C)   Ht 5' 2.6" (1.59 m)   Wt 129 lb 9.6 oz (58.8 kg)   LMP  (LMP Unknown)   SpO2 95%   BMI 23.25 kg/m    Subjective:    Patient ID: Tammy Pope, female    DOB: 06/15/1946, 71 y.o.   MRN: [id number]  HPI: Tammy Pope is a 71 y.o. female  Chief Complaint  Patient presents with  . Diabetes    pt states she knows she is due for eye exam   . Hyperlipidemia  . Hypertension   Diabetes: Using medications without difficulties No hypoglycemic episodes No hyperglycemic episodes Feet problems: none Blood Sugars averaging: eye exam within last year Last Hgb A1C:   Hypertension  Using medications without difficulty Average home BPs   Using medication without problems or lightheadedness No chest pain with exertion or shortness of breath No Edema  Elevated Cholesterol Using medications without problems No Muscle aches  Diet: Exercise:     Relevant past medical, surgical, family and social history reviewed and updated as indicated. Interim medical history since our last visit reviewed. Allergies and medications reviewed and updated.  Review of Systems  Per HPI unless specifically indicated above     Objective:    BP 127/82 (BP Location: Left Arm, Cuff Size: Small)   Pulse (!) 116   Temp 98.4 F (36.9 C)   Ht 5' 2.6" (1.59 m)   Wt 129 lb 9.6 oz (58.8 kg)   LMP  (LMP Unknown)   SpO2 95%   BMI 23.25 kg/m   Wt Readings from Last 3 Encounters:  06/03/17 129 lb 9.6 oz (58.8 kg)  05/01/17 131 lb 6 oz (59.6 kg)  04/17/17 129 lb 6 oz (58.7 kg)    Physical Exam  Constitutional: She is oriented to person, place, and time. She appears well-developed and well-nourished. No distress.  HENT:  Head: Normocephalic and atraumatic.  Eyes: Conjunctivae and lids are normal. Right eye exhibits no discharge. Left eye exhibits no discharge. No scleral icterus.  Neck: Normal range  of motion. Neck supple. No JVD present. Carotid bruit is not present.  Cardiovascular: Normal rate and normal heart sounds.  An irregular rhythm present.  Pulmonary/Chest: Effort normal and breath sounds normal.  Abdominal: Normal appearance. There is no splenomegaly or hepatomegaly.  Musculoskeletal: Normal range of motion.  Neurological: She is alert and oriented to person, place, and time.  Skin: Skin is warm, dry and intact. No rash noted. No pallor.  Psychiatric: She has a normal mood and affect. Her behavior is normal. Judgment and thought content normal.   EKG compared to her previous.  No acute changes. Sinus rhythm with PACs  Results for orders placed or performed in visit on 05/29/17  CBC with Differential  Result Value Ref Range   WBC 2.7 (L) 3.6 - 11.0 K/uL   RBC 3.59 (L) 3.80 - 5.20 MIL/uL   Hemoglobin 9.9 (L) 12.0 - 16.0 g/dL   HCT 30.1 (L) 35.0 - 47.0 %   MCV 83.7 80.0 - 100.0 fL   MCH 27.6 26.0 - 34.0 pg   MCHC 33.0 32.0 - 36.0 g/dL   RDW 21.5 (H) 11.5 - 14.5 %   Platelets 166 150 - 440 K/uL   Neutrophils Relative % 45 %   Neutro Abs 1.2 (L) 1.4 - 6.5 K/uL   Lymphocytes Relative  42 %   Lymphs Abs 1.1 1.0 - 3.6 K/uL   Monocytes Relative 6 %   Monocytes Absolute 0.2 0.2 - 0.9 K/uL   Eosinophils Relative 6 %   Eosinophils Absolute 0.1 0 - 0.7 K/uL   Basophils Relative 1 %   Basophils Absolute 0.0 0 - 0.1 K/uL  Comprehensive metabolic panel  Result Value Ref Range   Sodium 138 135 - 145 mmol/L   Potassium 3.8 3.5 - 5.1 mmol/L   Chloride 103 101 - 111 mmol/L   CO2 26 22 - 32 mmol/L   Glucose, Bld 245 (H) 65 - 99 mg/dL   BUN 11 6 - 20 mg/dL   Creatinine, Ser 0.62 0.44 - 1.00 mg/dL   Calcium 9.4 8.9 - 10.3 mg/dL   Total Protein 6.6 6.5 - 8.1 g/dL   Albumin 3.7 3.5 - 5.0 g/dL   AST 36 15 - 41 U/L   ALT 24 14 - 54 U/L   Alkaline Phosphatase 102 38 - 126 U/L   Total Bilirubin 0.4 0.3 - 1.2 mg/dL   GFR calc non Af Amer >60 >60 mL/min   GFR calc Af Amer >60 >60  mL/min   Anion gap 9 5 - 15      Assessment & Plan:   Problem List Items Addressed This Visit      Unprioritized   Hyperlipidemia    Stable, continue present medications.        Hypertension    Stable, continue present medications.        Type 2 diabetes mellitus (HCC) - Primary    HgbA1C at 6.5%.  This was stable      Relevant Orders   CBC with Differential/Platelet   Comprehensive metabolic panel   Bayer DCA Hb A1c Waived   Bayer DCA Hb A1c Waived   Comprehensive metabolic panel    Other Visit Diagnoses    Irregular heart rate       Due to PACs without changes   Relevant Orders   EKG 12-Lead (Completed)       Follow up plan: Return in about 6 months (around 12/03/2017).

## 2017-06-03 NOTE — Assessment & Plan Note (Signed)
Stable, continue present medications.   

## 2017-06-03 NOTE — Assessment & Plan Note (Signed)
HgbA1C at 6.5%.  This was stable

## 2017-06-04 LAB — CBC WITH DIFFERENTIAL/PLATELET
BASOS: 1 %
Basophils Absolute: 0 10*3/uL (ref 0.0–0.2)
EOS (ABSOLUTE): 0 10*3/uL (ref 0.0–0.4)
Eos: 2 %
HEMOGLOBIN: 10.3 g/dL — AB (ref 11.1–15.9)
Hematocrit: 33.3 % — ABNORMAL LOW (ref 34.0–46.6)
IMMATURE GRANULOCYTES: 1 %
Immature Grans (Abs): 0 10*3/uL (ref 0.0–0.1)
Lymphocytes Absolute: 0.9 10*3/uL (ref 0.7–3.1)
Lymphs: 46 %
MCH: 26.1 pg — AB (ref 26.6–33.0)
MCHC: 30.9 g/dL — ABNORMAL LOW (ref 31.5–35.7)
MCV: 84 fL (ref 79–97)
MONOCYTES: 3 %
Monocytes Absolute: 0.1 10*3/uL (ref 0.1–0.9)
NEUTROS ABS: 0.9 10*3/uL — AB (ref 1.4–7.0)
NEUTROS PCT: 47 %
Platelets: 198 10*3/uL (ref 150–379)
RBC: 3.95 x10E6/uL (ref 3.77–5.28)
RDW: 19.8 % — ABNORMAL HIGH (ref 12.3–15.4)
WBC: 1.9 10*3/uL — CL (ref 3.4–10.8)

## 2017-06-04 LAB — COMPREHENSIVE METABOLIC PANEL
A/G RATIO: 1.7 (ref 1.2–2.2)
ALBUMIN: 4 g/dL (ref 3.5–4.8)
ALT: 27 IU/L (ref 0–32)
AST: 24 IU/L (ref 0–40)
Alkaline Phosphatase: 129 IU/L — ABNORMAL HIGH (ref 39–117)
BILIRUBIN TOTAL: 0.3 mg/dL (ref 0.0–1.2)
BUN / CREAT RATIO: 10 — AB (ref 12–28)
BUN: 7 mg/dL — AB (ref 8–27)
CALCIUM: 9.8 mg/dL (ref 8.7–10.3)
CO2: 22 mmol/L (ref 20–29)
Chloride: 102 mmol/L (ref 96–106)
Creatinine, Ser: 0.72 mg/dL (ref 0.57–1.00)
GFR, EST AFRICAN AMERICAN: 97 mL/min/{1.73_m2} (ref >59)
GFR, EST NON AFRICAN AMERICAN: 85 mL/min/{1.73_m2} (ref >59)
GLUCOSE: 250 mg/dL — AB (ref 65–99)
Globulin, Total: 2.4 g/dL (ref 1.5–4.5)
Potassium: 3.8 mmol/L (ref 3.5–5.2)
Sodium: 141 mmol/L (ref 134–144)
TOTAL PROTEIN: 6.4 g/dL (ref 6.0–8.5)

## 2017-06-12 ENCOUNTER — Inpatient Hospital Stay: Payer: Medicare Other

## 2017-06-12 ENCOUNTER — Inpatient Hospital Stay (HOSPITAL_BASED_OUTPATIENT_CLINIC_OR_DEPARTMENT_OTHER): Payer: Medicare Other | Admitting: Internal Medicine

## 2017-06-12 VITALS — BP 170/86 | HR 115 | Temp 97.6°F | Resp 20 | Ht 62.6 in | Wt 131.4 lb

## 2017-06-12 VITALS — BP 158/75 | HR 79

## 2017-06-12 DIAGNOSIS — C50412 Malignant neoplasm of upper-outer quadrant of left female breast: Secondary | ICD-10-CM | POA: Diagnosis not present

## 2017-06-12 DIAGNOSIS — Z923 Personal history of irradiation: Secondary | ICD-10-CM | POA: Diagnosis not present

## 2017-06-12 DIAGNOSIS — I10 Essential (primary) hypertension: Secondary | ICD-10-CM | POA: Diagnosis not present

## 2017-06-12 DIAGNOSIS — C7951 Secondary malignant neoplasm of bone: Secondary | ICD-10-CM

## 2017-06-12 DIAGNOSIS — I700 Atherosclerosis of aorta: Secondary | ICD-10-CM | POA: Diagnosis not present

## 2017-06-12 DIAGNOSIS — R161 Splenomegaly, not elsewhere classified: Secondary | ICD-10-CM

## 2017-06-12 DIAGNOSIS — R000 Tachycardia, unspecified: Secondary | ICD-10-CM

## 2017-06-12 DIAGNOSIS — Z5111 Encounter for antineoplastic chemotherapy: Secondary | ICD-10-CM | POA: Diagnosis not present

## 2017-06-12 DIAGNOSIS — Z170 Estrogen receptor positive status [ER+]: Secondary | ICD-10-CM | POA: Diagnosis not present

## 2017-06-12 DIAGNOSIS — G629 Polyneuropathy, unspecified: Secondary | ICD-10-CM

## 2017-06-12 DIAGNOSIS — Z7984 Long term (current) use of oral hypoglycemic drugs: Secondary | ICD-10-CM

## 2017-06-12 DIAGNOSIS — Z79810 Long term (current) use of selective estrogen receptor modulators (SERMs): Secondary | ICD-10-CM | POA: Diagnosis not present

## 2017-06-12 DIAGNOSIS — E119 Type 2 diabetes mellitus without complications: Secondary | ICD-10-CM

## 2017-06-12 DIAGNOSIS — D649 Anemia, unspecified: Secondary | ICD-10-CM | POA: Diagnosis not present

## 2017-06-12 DIAGNOSIS — E785 Hyperlipidemia, unspecified: Secondary | ICD-10-CM

## 2017-06-12 DIAGNOSIS — Z7982 Long term (current) use of aspirin: Secondary | ICD-10-CM

## 2017-06-12 DIAGNOSIS — Z79899 Other long term (current) drug therapy: Secondary | ICD-10-CM

## 2017-06-12 LAB — CBC WITH DIFFERENTIAL/PLATELET
Basophils Absolute: 0 10*3/uL (ref 0–0.1)
Basophils Relative: 1 %
Eosinophils Absolute: 0.1 10*3/uL (ref 0–0.7)
Eosinophils Relative: 2 %
HEMATOCRIT: 32.4 % — AB (ref 35.0–47.0)
HEMOGLOBIN: 10.8 g/dL — AB (ref 12.0–16.0)
LYMPHS ABS: 1.5 10*3/uL (ref 1.0–3.6)
LYMPHS PCT: 29 %
MCH: 27.6 pg (ref 26.0–34.0)
MCHC: 33.3 g/dL (ref 32.0–36.0)
MCV: 82.8 fL (ref 80.0–100.0)
Monocytes Absolute: 0.7 10*3/uL (ref 0.2–0.9)
Monocytes Relative: 13 %
NEUTROS PCT: 55 %
Neutro Abs: 2.9 10*3/uL (ref 1.4–6.5)
Platelets: 198 10*3/uL (ref 150–440)
RBC: 3.91 MIL/uL (ref 3.80–5.20)
RDW: 21.5 % — ABNORMAL HIGH (ref 11.5–14.5)
WBC: 5.2 10*3/uL (ref 3.6–11.0)

## 2017-06-12 LAB — COMPREHENSIVE METABOLIC PANEL
ALK PHOS: 123 U/L (ref 38–126)
ALT: 27 U/L (ref 14–54)
ANION GAP: 8 (ref 5–15)
AST: 37 U/L (ref 15–41)
Albumin: 3.8 g/dL (ref 3.5–5.0)
BILIRUBIN TOTAL: 0.5 mg/dL (ref 0.3–1.2)
BUN: 10 mg/dL (ref 6–20)
CALCIUM: 9.6 mg/dL (ref 8.9–10.3)
CO2: 26 mmol/L (ref 22–32)
Chloride: 105 mmol/L (ref 101–111)
Creatinine, Ser: 0.68 mg/dL (ref 0.44–1.00)
GFR calc non Af Amer: >60 mL/min (ref >60)
Glucose, Bld: 218 mg/dL — ABNORMAL HIGH (ref 65–99)
Potassium: 3.7 mmol/L (ref 3.5–5.1)
SODIUM: 139 mmol/L (ref 135–145)
TOTAL PROTEIN: 7.1 g/dL (ref 6.5–8.1)

## 2017-06-12 MED ORDER — SODIUM CHLORIDE 0.9 % IV SOLN
Freq: Once | INTRAVENOUS | Status: AC
  Administered 2017-06-12: 10:00:00 via INTRAVENOUS
  Filled 2017-06-12: qty 1000

## 2017-06-12 MED ORDER — FAMOTIDINE IN NACL 20-0.9 MG/50ML-% IV SOLN
20.0000 mg | Freq: Once | INTRAVENOUS | Status: AC
  Administered 2017-06-12: 20 mg via INTRAVENOUS
  Filled 2017-06-12: qty 50

## 2017-06-12 MED ORDER — HEPARIN SOD (PORK) LOCK FLUSH 100 UNIT/ML IV SOLN: 500.0000 [IU] | Freq: Once | INTRAVENOUS | Status: DC | PRN

## 2017-06-12 MED ORDER — ACETAMINOPHEN 325 MG PO TABS
650.0000 mg | ORAL_TABLET | Freq: Once | ORAL | Status: AC
Start: 2017-06-12 — End: 2017-06-12
  Administered 2017-06-12: 650 mg via ORAL
  Filled 2017-06-12: qty 2

## 2017-06-12 MED ORDER — SODIUM CHLORIDE 0.9% FLUSH
10.0000 mL | INTRAVENOUS | Status: DC | PRN
  Administered 2017-06-12: 10 mL via INTRAVENOUS
  Filled 2017-06-12: qty 10

## 2017-06-12 MED ORDER — HEPARIN SOD (PORK) LOCK FLUSH 100 UNIT/ML IV SOLN
500.0000 [IU] | Freq: Once | INTRAVENOUS | Status: AC
  Administered 2017-06-12: 500 [IU] via INTRAVENOUS
  Filled 2017-06-12: qty 5

## 2017-06-12 MED ORDER — TRASTUZUMAB CHEMO 150 MG IV SOLR
2.0000 mg/kg | Freq: Once | INTRAVENOUS | Status: AC
  Administered 2017-06-12: 126 mg via INTRAVENOUS
  Filled 2017-06-12: qty 6

## 2017-06-12 MED ORDER — SODIUM CHLORIDE 0.9 % IV SOLN
20.0000 mg | Freq: Once | INTRAVENOUS | Status: AC
  Administered 2017-06-12: 20 mg via INTRAVENOUS
  Filled 2017-06-12: qty 2

## 2017-06-12 MED ORDER — DIPHENHYDRAMINE HCL 50 MG/ML IJ SOLN
50.0000 mg | Freq: Once | INTRAMUSCULAR | Status: AC
  Administered 2017-06-12: 50 mg via INTRAVENOUS
  Filled 2017-06-12: qty 1

## 2017-06-12 MED ORDER — SODIUM CHLORIDE 0.9 % IV SOLN
80.0000 mg/m2 | Freq: Once | INTRAVENOUS | Status: AC
  Administered 2017-06-12: 138 mg via INTRAVENOUS
  Filled 2017-06-12: qty 23

## 2017-06-12 NOTE — Assessment & Plan Note (Addendum)
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 On taxol- Herceptin cycle weekly days-1,8,15 q 28days. March 26th 2017- CT scan - significant improvement.  Clinically no progression of disease noted. We will order CT scan for end of July.  # proceed with cycle #10 day 1 today. Tolerating chemo well;. Labs today reviewed;  acceptable for treatment today- mild anemia.   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 Monitor closely.    # April 26th -MUGA scan- 58 % [Jan 2018-baseline 63%]. We'll ordered next visit  # Tachycardia- [May 0223] EKG revealed sinus tachycardia; with sinus arrhythmia. If worse recommend low-dose of beta blocker  # Bone metastases on X-geva [last 4/4]; will repeat it next visit.  # follow up with me in 2 weeks;chemo;labs.; MD in 4 weeks.weekly chemo.

## 2017-06-12 NOTE — Progress Notes (Signed)
[place] OFFICE PROGRESS NOTE  Patient Care Team: Kathrine Haddock, NP as PCP - General (Nurse Practitioner) Bary Castilla Forest Gleason, MD (General Surgery) Forest Gleason, MD (Oncology)  Breast cancer [facility] [address]   Staging form: Breast, AJCC 7th Edition     Clinical stage from 04/06/2015: Stage IV (T2, N2, M1) - Signed by Evlyn Kanner, NP on 04/06/2015    Oncology History   1.history of carcinoma of right breast status post lumpectomy and radiation therapy (upper and outer quadrant) February 21, 2009.  Estrogen receptor 90%.  Progesterone receptor 60%.  Patient had lumpectomy and MammoSite partial breast radiation therapy followed by tamoxifen for 5 years.  Patient is still taking tamoxifen.. 2 recent mammogram in December of 2015 negative.  Further evaluation by surgeon revealed a palpable mass in the left upper outer quadrant near axilla.  Ultrasound revealed one small normal-appearing lymph node 0.6 cm in lower limit of axilla.  Adjacent to this there was ill-defined multilobulated hypoechoic mass 1.8 x 2.6 cm.  Biopsy of the left axillary mass was done which was positive for high-grade carcinoma.   2,estrogen receptor positive.  Progesterone receptor positive.  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 3.  MRI scan of breast (January, 2015) reveals multiple abnormal enlarged left axillary lymph node There are 2 oval some circumscribed mass at 2:30 and 3:00 position of the left breast.  0.6 cm x 1.3 cm and 4 x 7 mm Non-mass enhancement extending 2.4 cm these masses is 3.6 cm mass.  Findings suggestive of several pulmonary nodules.  PET  scan shows extensive disease, 4.started on Taxotere,perjeta, Herceptin(January 03, 2015- June 2016.   # June 2016- Herceptin [Letrozole + Ibrance] until May 2017.   # MAY 2017- PET PROGRESSION; # July 2017- START KADCYLA  x4 cycles- SEP 15th 2017CT- PROGRESSION  # SEP 2017- START Taxol + Herceptin.   # Right sided Pleural effusion [s/p Thora Sep  2017]  # Bone mets- X-geva q 6W  # MUGA Jan 18th-63%     Carcinoma of upper-outer quadrant of left breast in female, estrogen receptor positive ([place])   09/28/2016 Initial Diagnosis    Carcinoma of upper-outer quadrant of left breast in female, estrogen receptor positive ([place])       INTERVAL HISTORY:  Tammy Pope 71 y.o.  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  Patient admits to mild tingling and numbness in the hands and feet. This does not interrupt her daily lifestyle. Otherwise, as per the husband patient has been doing well. She continues to deny any worsening shortness of breath or cough. Denies any chest pain. No headache. No jaw pain. Denies any new lumps or bumps. Denies any tingling or numbness. No falls. No swelling in the legs.   REVIEW OF SYSTEMS:  A complete 10 point review of system is done which is negative except mentioned above/history of present illness.   PAST MEDICAL HISTORY :  Past Medical History:  Diagnosis Date  . Axillary mass 12/15/2014  . Breast cancer ([place]) 02/21/2009  . Cancer [facility]) 2010   right breast ductal carcinoma in situ  . Diabetes mellitus without complication ([place])   . Floaters 2014  . Hyperlipidemia 1990  . Hypertension 2009  . Malignant neoplasm of axillary tail of female breast ([place]) 12/16/2014  . Malignant neoplasm of upper-outer quadrant of female breast ([place])  02/21/2009   DCIS of the right breast, intermediate grade, resected the negative margins. ER 90%, PR 50%, MammoSite partial breast radiation  . 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 Special screening for malignant neoplasms, colon     PAST SURGICAL HISTORY :   Past Surgical History:  Procedure Laterality Date  . APPENDECTOMY  1956  . BREAST BIOPSY Right 2010   stereo  .  BREAST SURGERY Right 2010   wide excision  . COLONOSCOPY  2008   ??? Md  . left axilla biopsy Left 12-14-14  . PARTIAL HYSTERECTOMY  1977    FAMILY HISTORY :   Family History  Problem Relation Age of Onset  . Cancer Other        unknown family members with ovarian,colon,breast cancers  . Hypertension Mother   . Heart disease Daughter        MI    SOCIAL HISTORY:   Social History  Substance Use Topics  . Smoking status: Never Smoker  . Smokeless tobacco: Never Used  . Alcohol use No    ALLERGIES:  is allergic to penicillin g and sulfa antibiotics.  MEDICATIONS:  Current Outpatient Prescriptions  Medication Sig Dispense Refill  . ALPRAZolam (XANAX) 0.5 MG tablet TAKE ONE TABLET BY MOUTH 3 TIMES DAILY AS NEEDED 90 tablet 1  . amLODipine (NORVASC) 5 MG tablet Take 1 tablet (5 mg total) by mouth daily. 90 tablet 3  . aspirin 81 MG tablet Take 81 mg by mouth daily.    . Calcium 600-200 MG-UNIT tablet Take 1 tablet by mouth 2 (two) times daily. 120 tablet 3  . metFORMIN (GLUCOPHAGE) 500 MG tablet Take 2 tablets (1,000 mg total) by mouth 2 (two) times daily with a meal. 360 tablet 5  . Multiple Vitamins-Minerals (MULTIVITAMIN ADULT PO) Take by mouth.    . ONE TOUCH ULTRA TEST test strip     . potassium chloride SA (KLOR-CON M20) 20 MEQ tablet Take 2 tablets (40 mEq total) by mouth daily. 60 tablet 3  . rosuvastatin (CRESTOR) 5 MG tablet Take 1 tablet (5 mg total) by mouth at bedtime. 90 tablet 1  . valsartan-hydrochlorothiazide (DIOVAN-HCT) 320-25 MG tablet Take 1 tablet by mouth daily. 90 tablet 3   No current facility-administered medications for this visit.    Facility-Administered Medications Ordered in Other Visits  Medication Dose Route Frequency Provider Last Rate Last Dose  . heparin lock flush 100 unit/mL  250 Units Intracatheter PRN Choksi, Janak, MD      . sodium chloride 0.9 % 250 mL with potassium chloride 40 mEq infusion   Intravenous Continuous Forest Gleason, MD    Stopped at 05/03/15 1229  . sodium chloride 0.9 % injection 10 mL  10 mL Intracatheter PRN Forest Gleason, MD   10 mL at 04/18/15 1545    PHYSICAL EXAMINATION: ECOG PERFORMANCE STATUS: 0 - Asymptomatic  BP (!) 170/86   Pulse (!) 115   Temp 97.6 F (36.4 C) (Tympanic)   Resp 20   Ht 5' 2.6" (1.59 m)   Wt 131 lb 6.4 oz (59.6 kg)   LMP  (LMP Unknown)   BMI 23.57 kg/m   Filed Weights   06/12/17 0905  Weight: 131 lb 6.4 oz (59.6 kg)    GENERAL: Well-nourished well-developed; Alert, no distress and comfortable.   Accompanied by her husband.  EYES: no pallor or icterus OROPHARYNX: no thrush or ulceration; poor dentition  NECK: supple, no masses felt LYMPH:  no palpable lymphadenopathy in the cervical, axillary or inguinal regions LUNGS: clear to auscultation and  No wheeze or crackles HEART/CVS: regular rate & rhythm and no murmurs; No lower extremity edema ABDOMEN:abdomen soft, non-tender and normal bowel sounds Musculoskeletal:no cyanosis of digits and no clubbing  PSYCH: alert & oriented x 3 with fluent speech NEURO: no focal motor/sensory deficits SKIN:  no rashes or significant lesions LEFT BREAST- 1-2cm mass left upper outer quadrant/axillary tail. Nontender. Skin: macular rash noted on the bilateral extremities  LABORATORY DATA:  I have reviewed the data as listed    Component Value Date/Time   NA 139 06/12/2017 0840   NA 141 06/03/2017 0925   NA 138 03/28/2015 0923   K 3.7 06/12/2017 0840   K 3.3 (L) 03/28/2015 0923   CL 105 06/12/2017 0840   CL 101 03/28/2015 0923   CO2 26 06/12/2017 0840   CO2 28 03/28/2015 0923   GLUCOSE 218 (H) 06/12/2017 0840   GLUCOSE 173 (H) 03/28/2015 0923   BUN 10 06/12/2017 0840   BUN 7 (L) 06/03/2017 0925   BUN 9 03/28/2015 0923   CREATININE 0.68 06/12/2017 0840   CREATININE 0.56 03/28/2015 0923   CALCIUM 9.6 06/12/2017 0840   CALCIUM 8.6 (L) 03/28/2015 0923   PROT 7.1 06/12/2017 0840   PROT 6.4 06/03/2017 0925   PROT 7.1  03/28/2015 0923   ALBUMIN 3.8 06/12/2017 0840   ALBUMIN 4.0 06/03/2017 0925   ALBUMIN 3.6 03/28/2015 0923   AST 37 06/12/2017 0840   AST 43 (H) 03/28/2015 0923   ALT 27 06/12/2017 0840   ALT 26 03/28/2015 0923   ALKPHOS 123 06/12/2017 0840   ALKPHOS 85 03/28/2015 0923   BILITOT 0.5 06/12/2017 0840   BILITOT 0.3 06/03/2017 0925   BILITOT 0.6 03/28/2015 0923   GFRNONAA >60 06/12/2017 0840   GFRNONAA >60 03/28/2015 0923   GFRAA >60 06/12/2017 0840   GFRAA >60 03/28/2015 0923    No results found for: SPEP, UPEP  Lab Results  Component Value Date   WBC 5.2 06/12/2017   NEUTROABS 2.9 06/12/2017   HGB 10.8 (L) 06/12/2017   HCT 32.4 (L) 06/12/2017   MCV 82.8 06/12/2017   PLT 198 06/12/2017      Chemistry      Component Value Date/Time   NA 139 06/12/2017 0840   NA 141 06/03/2017 0925   NA 138 03/28/2015 0923   K 3.7 06/12/2017 0840   K 3.3 (L) 03/28/2015 0923   CL 105 06/12/2017 0840   CL 101 03/28/2015 0923   CO2 26 06/12/2017 0840   CO2 28 03/28/2015 0923   BUN 10 06/12/2017 0840   BUN 7 (L) 06/03/2017 0925   BUN 9 03/28/2015 0923   CREATININE 0.68 06/12/2017 0840   CREATININE 0.56 03/28/2015 0923      Component Value Date/Time   CALCIUM 9.6 06/12/2017 0840   CALCIUM 8.6 (L) 03/28/2015 0923   ALKPHOS 123 06/12/2017 0840   ALKPHOS 85 03/28/2015 0923   AST 37 06/12/2017 0840   AST 43 (H) 03/28/2015 0923   ALT 27 06/12/2017 0840   ALT 26 03/28/2015 0923   BILITOT 0.5 06/12/2017 0840   BILITOT 0.3 06/03/2017 0925   BILITOT 0.6 03/28/2015 0923     IMPRESSION: 1. Overall, today's study demonstrates similar findings to the prior examination from January 2018. Specifically, there is widespread metastatic disease to the bones which appears essentially unchanged. No other sites of extraskeletal metastatic disease are noted on today's examination.  2. Morphologic changes in the liver compatible with underlying cirrhosis redemonstrated. This is associated with  evidence of portal hypertension, including a dilated portal vein and splenomegaly. 3. 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 4. Aortic atherosclerosis, in addition to two vessel coronary artery disease. Assessment for potential risk factor modification, dietary therapy or pharmacologic therapy may be warranted, if clinically indicated. 5. Additional incidental findings, as above.   Electronically Signed   By: Vinnie Langton M.D.   On: 03/11/2017 13:28  IMPRESSION: Normal LEFT ventricular ejection fraction of 58%, slightly decreased from the 63% on the prior exam.  Normal LEFT ventricular wall motion.  Mild splenic enlargement.  ASSESSMENT & PLAN:  Carcinoma of upper-outer quadrant of left breast in female, estrogen receptor positive ([place]) Metastatic breast cancer to the bone/lung. On taxol- Herceptin cycle weekly days-1,8,15 q 28days. March 26th 2017- CT scan - significant improvement.  Clinically no progression of disease noted. We will order CT scan for end of July.  # proceed with cycle #10 day 1 today. Tolerating chemo well;. Labs today reviewed;  acceptable for treatment today- mild anemia.   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 Monitor closely.    # April 26th -MUGA scan- 58 % [Jan 2018-baseline 63%]. We'll ordered next visit  # Tachycardia- [May 5409] EKG revealed sinus tachycardia; with sinus arrhythmia. If worse recommend low-dose of beta blocker  # Bone metastases on X-geva [last 4/4]; will repeat it next visit.  # follow up with me in 2 weeks;chemo;labs.; MD in 4 weeks.weekly chemo.  No orders of the defined types were placed in this encounter.      Cammie Sickle, MD 06/14/2017 7:17 PM

## 2017-06-20 ENCOUNTER — Inpatient Hospital Stay: Payer: Medicare Other | Attending: Internal Medicine

## 2017-06-20 ENCOUNTER — Inpatient Hospital Stay: Payer: Medicare Other

## 2017-06-20 VITALS — BP 163/84 | HR 116 | Temp 97.3°F | Resp 20

## 2017-06-20 DIAGNOSIS — Z170 Estrogen receptor positive status [ER+]: Secondary | ICD-10-CM | POA: Insufficient documentation

## 2017-06-20 DIAGNOSIS — R000 Tachycardia, unspecified: Secondary | ICD-10-CM | POA: Diagnosis not present

## 2017-06-20 DIAGNOSIS — J90 Pleural effusion, not elsewhere classified: Secondary | ICD-10-CM | POA: Insufficient documentation

## 2017-06-20 DIAGNOSIS — Z5112 Encounter for antineoplastic immunotherapy: Secondary | ICD-10-CM | POA: Insufficient documentation

## 2017-06-20 DIAGNOSIS — Z5111 Encounter for antineoplastic chemotherapy: Secondary | ICD-10-CM | POA: Diagnosis not present

## 2017-06-20 DIAGNOSIS — C7951 Secondary malignant neoplasm of bone: Secondary | ICD-10-CM | POA: Insufficient documentation

## 2017-06-20 DIAGNOSIS — C50412 Malignant neoplasm of upper-outer quadrant of left female breast: Secondary | ICD-10-CM

## 2017-06-20 DIAGNOSIS — E785 Hyperlipidemia, unspecified: Secondary | ICD-10-CM | POA: Insufficient documentation

## 2017-06-20 DIAGNOSIS — Z7984 Long term (current) use of oral hypoglycemic drugs: Secondary | ICD-10-CM | POA: Diagnosis not present

## 2017-06-20 DIAGNOSIS — I700 Atherosclerosis of aorta: Secondary | ICD-10-CM | POA: Insufficient documentation

## 2017-06-20 DIAGNOSIS — I10 Essential (primary) hypertension: Secondary | ICD-10-CM | POA: Insufficient documentation

## 2017-06-20 DIAGNOSIS — G629 Polyneuropathy, unspecified: Secondary | ICD-10-CM | POA: Diagnosis not present

## 2017-06-20 DIAGNOSIS — K766 Portal hypertension: Secondary | ICD-10-CM | POA: Diagnosis not present

## 2017-06-20 DIAGNOSIS — Z7982 Long term (current) use of aspirin: Secondary | ICD-10-CM | POA: Insufficient documentation

## 2017-06-20 DIAGNOSIS — R221 Localized swelling, mass and lump, neck: Secondary | ICD-10-CM | POA: Insufficient documentation

## 2017-06-20 DIAGNOSIS — Z79899 Other long term (current) drug therapy: Secondary | ICD-10-CM | POA: Diagnosis not present

## 2017-06-20 DIAGNOSIS — R161 Splenomegaly, not elsewhere classified: Secondary | ICD-10-CM | POA: Diagnosis not present

## 2017-06-20 DIAGNOSIS — E119 Type 2 diabetes mellitus without complications: Secondary | ICD-10-CM | POA: Diagnosis not present

## 2017-06-20 DIAGNOSIS — D649 Anemia, unspecified: Secondary | ICD-10-CM | POA: Insufficient documentation

## 2017-06-20 LAB — COMPREHENSIVE METABOLIC PANEL
ALK PHOS: 133 U/L — AB (ref 38–126)
ALT: 28 U/L (ref 14–54)
AST: 36 U/L (ref 15–41)
Albumin: 4.1 g/dL (ref 3.5–5.0)
Anion gap: 10 (ref 5–15)
BILIRUBIN TOTAL: 0.7 mg/dL (ref 0.3–1.2)
BUN: 11 mg/dL (ref 6–20)
CALCIUM: 9.4 mg/dL (ref 8.9–10.3)
CO2: 26 mmol/L (ref 22–32)
CREATININE: 0.86 mg/dL (ref 0.44–1.00)
Chloride: 102 mmol/L (ref 101–111)
GFR calc Af Amer: >60 mL/min (ref >60)
GFR calc non Af Amer: >60 mL/min (ref >60)
GLUCOSE: 216 mg/dL — AB (ref 65–99)
Potassium: 4 mmol/L (ref 3.5–5.1)
SODIUM: 138 mmol/L (ref 135–145)
TOTAL PROTEIN: 7.6 g/dL (ref 6.5–8.1)

## 2017-06-20 LAB — CBC WITH DIFFERENTIAL/PLATELET
BASOS PCT: 1 %
Basophils Absolute: 0.1 10*3/uL (ref 0–0.1)
EOS ABS: 0.2 10*3/uL (ref 0–0.7)
Eosinophils Relative: 2 %
HEMATOCRIT: 33.6 % — AB (ref 35.0–47.0)
HEMOGLOBIN: 11.1 g/dL — AB (ref 12.0–16.0)
LYMPHS ABS: 1.9 10*3/uL (ref 1.0–3.6)
Lymphocytes Relative: 22 %
MCH: 27.1 pg (ref 26.0–34.0)
MCHC: 33 g/dL (ref 32.0–36.0)
MCV: 82 fL (ref 80.0–100.0)
MONOS PCT: 6 %
Monocytes Absolute: 0.5 10*3/uL (ref 0.2–0.9)
NEUTROS ABS: 6 10*3/uL (ref 1.4–6.5)
NEUTROS PCT: 69 %
Platelets: 260 10*3/uL (ref 150–440)
RBC: 4.09 MIL/uL (ref 3.80–5.20)
RDW: 21.6 % — ABNORMAL HIGH (ref 11.5–14.5)
WBC: 8.6 10*3/uL (ref 3.6–11.0)

## 2017-06-20 MED ORDER — DIPHENHYDRAMINE HCL 50 MG/ML IJ SOLN
50.0000 mg | Freq: Once | INTRAMUSCULAR | Status: AC
  Administered 2017-06-20: 50 mg via INTRAVENOUS

## 2017-06-20 MED ORDER — FAMOTIDINE IN NACL 20-0.9 MG/50ML-% IV SOLN
20.0000 mg | Freq: Once | INTRAVENOUS | Status: AC
  Administered 2017-06-20: 20 mg via INTRAVENOUS

## 2017-06-20 MED ORDER — ACETAMINOPHEN 325 MG PO TABS
650.0000 mg | ORAL_TABLET | Freq: Once | ORAL | Status: AC
  Administered 2017-06-20: 650 mg via ORAL

## 2017-06-20 MED ORDER — HEPARIN SOD (PORK) LOCK FLUSH 100 UNIT/ML IV SOLN
500.0000 [IU] | Freq: Once | INTRAVENOUS | Status: AC | PRN
  Administered 2017-06-20: 500 [IU]

## 2017-06-20 MED ORDER — SODIUM CHLORIDE 0.9% FLUSH
10.0000 mL | INTRAVENOUS | Status: DC | PRN
  Administered 2017-06-20: 10 mL
  Filled 2017-06-20: qty 10

## 2017-06-20 MED ORDER — SODIUM CHLORIDE 0.9 % IV SOLN
20.0000 mg | Freq: Once | INTRAVENOUS | Status: AC
  Administered 2017-06-20: 20 mg via INTRAVENOUS
  Filled 2017-06-20: qty 2

## 2017-06-20 MED ORDER — SODIUM CHLORIDE 0.9 % IV SOLN
Freq: Once | INTRAVENOUS | Status: AC
  Administered 2017-06-20: 09:00:00 via INTRAVENOUS
  Filled 2017-06-20: qty 1000

## 2017-06-20 MED ORDER — TRASTUZUMAB CHEMO 150 MG IV SOLR
2.0000 mg/kg | Freq: Once | INTRAVENOUS | Status: AC
  Administered 2017-06-20: 126 mg via INTRAVENOUS
  Filled 2017-06-20: qty 6

## 2017-06-20 MED ORDER — SODIUM CHLORIDE 0.9 % IV SOLN
80.0000 mg/m2 | Freq: Once | INTRAVENOUS | Status: AC
  Administered 2017-06-20: 138 mg via INTRAVENOUS
  Filled 2017-06-20: qty 23

## 2017-06-26 ENCOUNTER — Inpatient Hospital Stay (HOSPITAL_BASED_OUTPATIENT_CLINIC_OR_DEPARTMENT_OTHER): Payer: Medicare Other | Admitting: Internal Medicine

## 2017-06-26 ENCOUNTER — Inpatient Hospital Stay: Payer: Medicare Other

## 2017-06-26 VITALS — BP 181/81 | HR 109 | Temp 98.7°F | Resp 18 | Ht 62.6 in | Wt 130.5 lb

## 2017-06-26 DIAGNOSIS — R221 Localized swelling, mass and lump, neck: Secondary | ICD-10-CM | POA: Diagnosis not present

## 2017-06-26 DIAGNOSIS — Z170 Estrogen receptor positive status [ER+]: Principal | ICD-10-CM

## 2017-06-26 DIAGNOSIS — C50412 Malignant neoplasm of upper-outer quadrant of left female breast: Secondary | ICD-10-CM | POA: Diagnosis not present

## 2017-06-26 DIAGNOSIS — Z5112 Encounter for antineoplastic immunotherapy: Secondary | ICD-10-CM | POA: Diagnosis not present

## 2017-06-26 DIAGNOSIS — R000 Tachycardia, unspecified: Secondary | ICD-10-CM

## 2017-06-26 DIAGNOSIS — Z5111 Encounter for antineoplastic chemotherapy: Secondary | ICD-10-CM | POA: Diagnosis not present

## 2017-06-26 DIAGNOSIS — D649 Anemia, unspecified: Secondary | ICD-10-CM

## 2017-06-26 DIAGNOSIS — I10 Essential (primary) hypertension: Secondary | ICD-10-CM

## 2017-06-26 DIAGNOSIS — C50612 Malignant neoplasm of axillary tail of left female breast: Secondary | ICD-10-CM

## 2017-06-26 DIAGNOSIS — G629 Polyneuropathy, unspecified: Secondary | ICD-10-CM

## 2017-06-26 DIAGNOSIS — K766 Portal hypertension: Secondary | ICD-10-CM

## 2017-06-26 DIAGNOSIS — C7951 Secondary malignant neoplasm of bone: Secondary | ICD-10-CM | POA: Diagnosis not present

## 2017-06-26 DIAGNOSIS — I700 Atherosclerosis of aorta: Secondary | ICD-10-CM | POA: Diagnosis not present

## 2017-06-26 DIAGNOSIS — E785 Hyperlipidemia, unspecified: Secondary | ICD-10-CM | POA: Diagnosis not present

## 2017-06-26 DIAGNOSIS — J90 Pleural effusion, not elsewhere classified: Secondary | ICD-10-CM | POA: Diagnosis not present

## 2017-06-26 DIAGNOSIS — R161 Splenomegaly, not elsewhere classified: Secondary | ICD-10-CM | POA: Diagnosis not present

## 2017-06-26 DIAGNOSIS — E119 Type 2 diabetes mellitus without complications: Secondary | ICD-10-CM

## 2017-06-26 DIAGNOSIS — Z79899 Other long term (current) drug therapy: Secondary | ICD-10-CM

## 2017-06-26 DIAGNOSIS — Z7984 Long term (current) use of oral hypoglycemic drugs: Secondary | ICD-10-CM

## 2017-06-26 DIAGNOSIS — C50611 Malignant neoplasm of axillary tail of right female breast: Secondary | ICD-10-CM

## 2017-06-26 DIAGNOSIS — Z7982 Long term (current) use of aspirin: Secondary | ICD-10-CM

## 2017-06-26 LAB — CBC WITH DIFFERENTIAL/PLATELET
Basophils Absolute: 0 10*3/uL (ref 0–0.1)
Basophils Relative: 1 %
EOS ABS: 0.2 10*3/uL (ref 0–0.7)
Eosinophils Relative: 6 %
HCT: 30.3 % — ABNORMAL LOW (ref 35.0–47.0)
HEMOGLOBIN: 10.1 g/dL — AB (ref 12.0–16.0)
LYMPHS ABS: 1.1 10*3/uL (ref 1.0–3.6)
Lymphocytes Relative: 31 %
MCH: 27.2 pg (ref 26.0–34.0)
MCHC: 33.2 g/dL (ref 32.0–36.0)
MCV: 82 fL (ref 80.0–100.0)
MONOS PCT: 5 %
Monocytes Absolute: 0.2 10*3/uL (ref 0.2–0.9)
NEUTROS PCT: 57 %
Neutro Abs: 1.9 10*3/uL (ref 1.4–6.5)
Platelets: 178 10*3/uL (ref 150–440)
RBC: 3.69 MIL/uL — ABNORMAL LOW (ref 3.80–5.20)
RDW: 22.1 % — ABNORMAL HIGH (ref 11.5–14.5)
WBC: 3.4 10*3/uL — ABNORMAL LOW (ref 3.6–11.0)

## 2017-06-26 LAB — COMPREHENSIVE METABOLIC PANEL
ALK PHOS: 113 U/L (ref 38–126)
ALT: 27 U/L (ref 14–54)
ANION GAP: 9 (ref 5–15)
AST: 36 U/L (ref 15–41)
Albumin: 3.7 g/dL (ref 3.5–5.0)
BILIRUBIN TOTAL: 0.5 mg/dL (ref 0.3–1.2)
BUN: 13 mg/dL (ref 6–20)
CALCIUM: 9.2 mg/dL (ref 8.9–10.3)
CO2: 25 mmol/L (ref 22–32)
Chloride: 106 mmol/L (ref 101–111)
Creatinine, Ser: 0.63 mg/dL (ref 0.44–1.00)
Glucose, Bld: 236 mg/dL — ABNORMAL HIGH (ref 65–99)
Potassium: 4 mmol/L (ref 3.5–5.1)
SODIUM: 140 mmol/L (ref 135–145)
TOTAL PROTEIN: 6.8 g/dL (ref 6.5–8.1)

## 2017-06-26 MED ORDER — DIPHENHYDRAMINE HCL 50 MG/ML IJ SOLN
50.0000 mg | Freq: Once | INTRAMUSCULAR | Status: AC
  Administered 2017-06-26: 50 mg via INTRAVENOUS

## 2017-06-26 MED ORDER — SODIUM CHLORIDE 0.9 % IV SOLN
20.0000 mg | Freq: Once | INTRAVENOUS | Status: AC
  Administered 2017-06-26: 20 mg via INTRAVENOUS
  Filled 2017-06-26: qty 2

## 2017-06-26 MED ORDER — DENOSUMAB 120 MG/1.7ML ~~LOC~~ SOLN
120.0000 mg | Freq: Once | SUBCUTANEOUS | Status: AC
  Administered 2017-06-26: 120 mg via SUBCUTANEOUS

## 2017-06-26 MED ORDER — SODIUM CHLORIDE 0.9 % IV SOLN
80.0000 mg/m2 | Freq: Once | INTRAVENOUS | Status: AC
  Administered 2017-06-26: 138 mg via INTRAVENOUS
  Filled 2017-06-26: qty 23

## 2017-06-26 MED ORDER — HEPARIN SOD (PORK) LOCK FLUSH 100 UNIT/ML IV SOLN
500.0000 [IU] | Freq: Once | INTRAVENOUS | Status: AC | PRN
  Administered 2017-06-26: 500 [IU]

## 2017-06-26 MED ORDER — ACETAMINOPHEN 325 MG PO TABS
650.0000 mg | ORAL_TABLET | Freq: Once | ORAL | Status: AC
  Administered 2017-06-26: 650 mg via ORAL

## 2017-06-26 MED ORDER — SODIUM CHLORIDE 0.9% FLUSH
10.0000 mL | INTRAVENOUS | Status: DC | PRN
  Administered 2017-06-26: 10 mL
  Filled 2017-06-26: qty 10

## 2017-06-26 MED ORDER — FAMOTIDINE IN NACL 20-0.9 MG/50ML-% IV SOLN
20.0000 mg | Freq: Once | INTRAVENOUS | Status: AC
  Administered 2017-06-26: 20 mg via INTRAVENOUS

## 2017-06-26 MED ORDER — TRASTUZUMAB CHEMO 150 MG IV SOLR
2.0000 mg/kg | Freq: Once | INTRAVENOUS | Status: AC
  Administered 2017-06-26: 126 mg via INTRAVENOUS
  Filled 2017-06-26: qty 6

## 2017-06-26 MED ORDER — SODIUM CHLORIDE 0.9 % IV SOLN
Freq: Once | INTRAVENOUS | Status: AC
  Administered 2017-06-26: 10:00:00 via INTRAVENOUS
  Filled 2017-06-26: qty 1000

## 2017-06-26 NOTE — Assessment & Plan Note (Addendum)
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 On taxol- Herceptin cycle weekly days-1,8,15 q 28days. March 26th 2017- CT scan - significant improvement.  Clinically no progression of disease noted.   # proceed with cycle #10 day 1 today. Tolerating chemo well;. Labs today reviewed;  acceptable for treatment today- mild anemia.   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 Monitor closely.    # April 26th -MUGA scan- 58 % [Jan 2018-baseline 63%]. Will repeat order today.   # Elevated Blood pressure- monitor closely.   # Tachycardia- [May 5027] EKG revealed sinus tachycardia; with sinus arrhythmia. Stable.  # Bone metastases on X-geva [last 4/4]; will repeat it next visit.  # follow up with me in 2 weeks;chemo;labs; ; MUGA scan ordered today;  will order scans at next visit;  MD in 4 weeks.

## 2017-06-26 NOTE — Progress Notes (Signed)
[name] OFFICE PROGRESS NOTE  Patient Care Team: Tammy Haddock, NP as PCP - General (Nurse Practitioner) Tammy Pope Tammy Gleason, MD (General Surgery) Tammy Gleason, MD (Oncology)  Breast cancer [facility])   Staging form: Breast, AJCC 7th Edition     Clinical stage from 04/06/2015: Stage IV (T2, N2, M1) - Signed by Tammy Kanner, NP on 04/06/2015    Oncology History   1.history of carcinoma of right breast status post lumpectomy and radiation therapy (upper and outer quadrant) February 21, 2009.  Estrogen receptor 90%.  Progesterone receptor 60%.  Patient had lumpectomy and MammoSite partial breast radiation therapy followed by tamoxifen for 5 years.  Patient is still taking tamoxifen.. 2 recent mammogram in December of 2015 negative.  Further evaluation by surgeon revealed a palpable mass in the left upper outer quadrant near axilla.  Ultrasound revealed one small normal-appearing lymph node 0.6 cm in lower limit of axilla.  Adjacent to this there was ill-defined multilobulated hypoechoic mass 1.8 x 2.6 cm.  Biopsy of the left axillary mass was done which was positive for high-grade carcinoma.   2,estrogen receptor positive.  Progesterone receptor positive.  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 3.  MRI scan of breast (January, 2015) reveals multiple abnormal enlarged left axillary lymph node There are 2 oval some circumscribed mass at 2:30 and 3:00 position of the left breast.  0.6 cm x 1.3 cm and 4 x 7 mm Non-mass enhancement extending 2.4 cm these masses is 3.6 cm mass.  Findings suggestive of several pulmonary nodules.  PET  scan shows extensive disease, 4.started on Taxotere,perjeta, Herceptin(January 03, 2015- June 2016.   # June 2016- Herceptin [Letrozole + Ibrance] until May 2017.   # MAY 2017- PET PROGRESSION; # July 2017- START KADCYLA  x4 cycles- SEP 15th 2017CT- PROGRESSION  # SEP 2017- START Taxol + Herceptin.   # Right sided Pleural effusion [s/p Thora Sep  2017]  # Bone mets- X-geva q 6W  # MUGA Jan 18th-63%     Carcinoma of upper-outer quadrant of left breast in female, estrogen receptor positive (Tammy Pope)   09/28/2016 Initial Diagnosis    Carcinoma of upper-outer quadrant of left breast in female, estrogen receptor positive (Tammy Pope)       INTERVAL HISTORY:  Tammy Pope 71 y.o.  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  She continues to deny any worsening shortness of breath or cough. Denies any chest pain. No headache. No jaw pain. Denies any new lumps or bumps.No falls. No swelling in the legs. No headaches no stumbling or falling.  Patient admits to mild tingling and numbness in the hands and feet. This does not interrupt her daily lifestyle. Otherwise, as per the husband patient has been doing well.   REVIEW OF SYSTEMS:  A complete 10 point review of system is done which is negative except mentioned above/history of present illness.   PAST MEDICAL HISTORY :  Past Medical History:  Diagnosis Date  . Axillary mass 12/15/2014  . Breast cancer (Tammy Pope) 02/21/2009  . Cancer Tammy Pope) 2010   right breast ductal carcinoma in situ  . Diabetes mellitus without complication (Tammy Pope)   . Floaters 2014  . Hyperlipidemia 1990  . Hypertension 2009  . Malignant neoplasm of axillary tail of female breast (Tammy Pope) 12/16/2014  . Malignant neoplasm of upper-outer quadrant of female breast (  Tammy Pope) 02/21/2009   DCIS of the right breast, intermediate grade, resected the negative margins. ER 90%, PR 50%, MammoSite partial breast radiation  . 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 Special screening for malignant neoplasms, colon     PAST SURGICAL HISTORY :   Past Surgical History:  Procedure Laterality Date  . APPENDECTOMY  1956  . BREAST BIOPSY Right 2010    stereo  . BREAST SURGERY Right 2010   wide excision  . COLONOSCOPY  2008   ??? Md  . left axilla biopsy Left 12-14-14  . PARTIAL HYSTERECTOMY  1977    FAMILY HISTORY :   Family History  Problem Relation Age of Onset  . Cancer Other        unknown family members with ovarian,colon,breast cancers  . Hypertension Mother   . Heart disease Daughter        MI    SOCIAL HISTORY:   Social History  Substance Use Topics  . Smoking status: Never Smoker  . Smokeless tobacco: Never Used  . Alcohol use No    ALLERGIES:  is allergic to penicillin g and sulfa antibiotics.  MEDICATIONS:  Current Outpatient Prescriptions  Medication Sig Dispense Refill  . ALPRAZolam (XANAX) 0.5 MG tablet TAKE ONE TABLET BY MOUTH 3 TIMES DAILY AS NEEDED 90 tablet 1  . amLODipine (NORVASC) 5 MG tablet Take 1 tablet (5 mg total) by mouth daily. 90 tablet 3  . aspirin 81 MG tablet Take 81 mg by mouth daily.    . Calcium 600-200 MG-UNIT tablet Take 1 tablet by mouth 2 (two) times daily. 120 tablet 3  . metFORMIN (GLUCOPHAGE) 500 MG tablet Take 2 tablets (1,000 mg total) by mouth 2 (two) times daily with a meal. 360 tablet 5  . Multiple Vitamins-Minerals (MULTIVITAMIN ADULT PO) Take 1 tablet by mouth daily.     . ONE TOUCH ULTRA TEST test strip     . potassium chloride SA (KLOR-CON M20) 20 MEQ tablet Take 2 tablets (40 mEq total) by mouth daily. 60 tablet 3  . rosuvastatin (CRESTOR) 5 MG tablet Take 1 tablet (5 mg total) by mouth at bedtime. 90 tablet 1  . valsartan-hydrochlorothiazide (DIOVAN-HCT) 320-25 MG tablet Take 1 tablet by mouth daily. 90 tablet 3   No current facility-administered medications for this visit.    Facility-Administered Medications Ordered in Other Visits  Medication Dose Route Frequency Provider Last Rate Last Dose  . heparin lock flush 100 unit/mL  250 Units Intracatheter PRN Choksi, Janak, MD      . sodium chloride 0.9 % 250 mL with potassium chloride 40 mEq infusion   Intravenous  Continuous Tammy Gleason, MD   Stopped at 05/03/15 1229  . sodium chloride 0.9 % injection 10 mL  10 mL Intracatheter PRN Tammy Gleason, MD   10 mL at 04/18/15 1545    PHYSICAL EXAMINATION: ECOG PERFORMANCE STATUS: 0 - Asymptomatic  BP (!) 181/81 (Patient Position: Sitting)   Pulse (!) 109   Temp 98.7 F (37.1 C) (Tympanic)   Resp 18   Ht 5' 2.6" (1.59 m)   Wt 130 lb 8 oz (59.2 kg)   LMP  (LMP Unknown)   BMI 23.41 kg/m   Filed Weights   06/26/17 0858  Weight: 130 lb 8 oz (59.2 kg)    GENERAL: Well-nourished well-developed; Alert, no distress and comfortable.   Accompanied by her husband.  EYES: no pallor or icterus OROPHARYNX: no thrush or ulceration; poor  dentition  NECK: supple, no masses felt LYMPH:  no palpable lymphadenopathy in the cervical, axillary or inguinal regions LUNGS: clear to auscultation and  No wheeze or crackles HEART/CVS: regular rate & rhythm and no murmurs; No lower extremity edema ABDOMEN:abdomen soft, non-tender and normal bowel sounds Musculoskeletal:no cyanosis of digits and no clubbing  PSYCH: alert & oriented x 3 with fluent speech NEURO: no focal motor/sensory deficits SKIN:  no rashes or significant lesions LEFT BREAST- 1-2cm mass left upper outer quadrant/axillary tail. Nontender. Skin: macular rash noted on the bilateral extremities  LABORATORY DATA:  I have reviewed the data as listed    Component Value Date/Time   NA 140 06/26/2017 0814   NA 141 06/03/2017 0925   NA 138 03/28/2015 0923   K 4.0 06/26/2017 0814   K 3.3 (L) 03/28/2015 0923   CL 106 06/26/2017 0814   CL 101 03/28/2015 0923   CO2 25 06/26/2017 0814   CO2 28 03/28/2015 0923   GLUCOSE 236 (H) 06/26/2017 0814   GLUCOSE 173 (H) 03/28/2015 0923   BUN 13 06/26/2017 0814   BUN 7 (L) 06/03/2017 0925   BUN 9 03/28/2015 0923   CREATININE 0.63 06/26/2017 0814   CREATININE 0.56 03/28/2015 0923   CALCIUM 9.2 06/26/2017 0814   CALCIUM 8.6 (L) 03/28/2015 0923   PROT 6.8  06/26/2017 0814   PROT 6.4 06/03/2017 0925   PROT 7.1 03/28/2015 0923   ALBUMIN 3.7 06/26/2017 0814   ALBUMIN 4.0 06/03/2017 0925   ALBUMIN 3.6 03/28/2015 0923   AST 36 06/26/2017 0814   AST 43 (H) 03/28/2015 0923   ALT 27 06/26/2017 0814   ALT 26 03/28/2015 0923   ALKPHOS 113 06/26/2017 0814   ALKPHOS 85 03/28/2015 0923   BILITOT 0.5 06/26/2017 0814   BILITOT 0.3 06/03/2017 0925   BILITOT 0.6 03/28/2015 0923   GFRNONAA >60 06/26/2017 0814   GFRNONAA >60 03/28/2015 0923   GFRAA >60 06/26/2017 0814   GFRAA >60 03/28/2015 0923    No results found for: SPEP, UPEP  Lab Results  Component Value Date   WBC 3.4 (L) 06/26/2017   NEUTROABS 1.9 06/26/2017   HGB 10.1 (L) 06/26/2017   HCT 30.3 (L) 06/26/2017   MCV 82.0 06/26/2017   PLT 178 06/26/2017      Chemistry      Component Value Date/Time   NA 140 06/26/2017 0814   NA 141 06/03/2017 0925   NA 138 03/28/2015 0923   K 4.0 06/26/2017 0814   K 3.3 (L) 03/28/2015 0923   CL 106 06/26/2017 0814   CL 101 03/28/2015 0923   CO2 25 06/26/2017 0814   CO2 28 03/28/2015 0923   BUN 13 06/26/2017 0814   BUN 7 (L) 06/03/2017 0925   BUN 9 03/28/2015 0923   CREATININE 0.63 06/26/2017 0814   CREATININE 0.56 03/28/2015 0923      Component Value Date/Time   CALCIUM 9.2 06/26/2017 0814   CALCIUM 8.6 (L) 03/28/2015 0923   ALKPHOS 113 06/26/2017 0814   ALKPHOS 85 03/28/2015 0923   AST 36 06/26/2017 0814   AST 43 (H) 03/28/2015 0923   ALT 27 06/26/2017 0814   ALT 26 03/28/2015 0923   BILITOT 0.5 06/26/2017 0814   BILITOT 0.3 06/03/2017 0925   BILITOT 0.6 03/28/2015 0923     IMPRESSION: 1. Overall, today's study demonstrates similar findings to the prior examination from January 2018. Specifically, there is widespread metastatic disease to the bones which appears essentially unchanged. No other  sites of extraskeletal metastatic disease are noted on today's examination. 2. Morphologic changes in the liver compatible with  underlying cirrhosis redemonstrated. This is associated with evidence of portal hypertension, including a dilated portal vein and splenomegaly. 3. 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 4. Aortic atherosclerosis, in addition to two vessel coronary artery disease. Assessment for potential risk factor modification, dietary therapy or pharmacologic therapy may be warranted, if clinically indicated. 5. Additional incidental findings, as above.   Electronically Signed   By: Vinnie Langton M.D.   On: 03/11/2017 13:28  IMPRESSION: Normal LEFT ventricular ejection fraction of 58%, slightly decreased from the 63% on the prior exam.  Normal LEFT ventricular wall motion.  Mild splenic enlargement.  ASSESSMENT & PLAN:  Carcinoma of upper-outer quadrant of left breast in female, estrogen receptor positive ([facility]) Metastatic breast cancer to the bone/lung. On taxol- Herceptin cycle weekly days-1,8,15 q 28days. March 26th 2017- CT scan - significant improvement.  Clinically no progression of disease noted.   # proceed with cycle #10 day 1 today. Tolerating chemo well;. Labs today reviewed;  acceptable for treatment today- mild anemia.   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 Monitor closely.    # April 26th -MUGA scan- 58 % [Jan 2018-baseline 63%]. Will repeat order today.   # Elevated Blood pressure- monitor closely.   # Tachycardia- [May 5009] EKG revealed sinus tachycardia; with sinus arrhythmia. Stable.  # Bone metastases on X-geva [last 4/4]; will repeat it next visit.  # follow up with me in 2 weeks;chemo;labs; ; MUGA scan ordered today;  will order scans at next visit;  MD in 4 weeks.  Orders Placed This Encounter  Procedures  . NM Cardiac [facility]    Standing Status:   Future    Standing Expiration  Date:   06/26/2018    Order Specific Question:   Reason for Exam (SYMPTOM  OR DIAGNOSIS REQUIRED)    Answer:   carditoxic chemo    Order Specific Question:   Preferred imaging location?    Answer:   [facility]    Order Specific Question:   If indicated for the ordered procedure, I authorize the administration of a radiopharmaceutical per Radiology protocol    Answer:   Yes       Cammie Sickle, MD 06/26/2017 6:30 PM

## 2017-06-28 ENCOUNTER — Ambulatory Visit: Payer: Medicare Other | Admitting: Internal Medicine

## 2017-07-08 ENCOUNTER — Ambulatory Visit
Admission: RE | Admit: 2017-07-08 | Discharge: 2017-07-08 | Disposition: A | Discharge status: Home / Self Care | Payer: Medicare Other | Source: Ambulatory Visit | Attending: Internal Medicine | Admitting: Internal Medicine

## 2017-07-08 DIAGNOSIS — C50412 Malignant neoplasm of upper-outer quadrant of left female breast: Secondary | ICD-10-CM | POA: Insufficient documentation

## 2017-07-08 DIAGNOSIS — Z170 Estrogen receptor positive status [ER+]: Secondary | ICD-10-CM | POA: Insufficient documentation

## 2017-07-08 DIAGNOSIS — C50912 Malignant neoplasm of unspecified site of left female breast: Secondary | ICD-10-CM | POA: Diagnosis not present

## 2017-07-08 MED ORDER — TECHNETIUM TC 99M-LABELED RED BLOOD CELLS IV KIT
20.0000 | PACK | Freq: Once | INTRAVENOUS | Status: AC | PRN
  Administered 2017-07-08: 19.403 via INTRAVENOUS

## 2017-07-10 ENCOUNTER — Inpatient Hospital Stay (HOSPITAL_BASED_OUTPATIENT_CLINIC_OR_DEPARTMENT_OTHER): Payer: Medicare Other | Admitting: Internal Medicine

## 2017-07-10 ENCOUNTER — Inpatient Hospital Stay: Payer: Medicare Other

## 2017-07-10 VITALS — BP 169/91 | HR 106 | Temp 98.3°F | Resp 16 | Wt 131.2 lb

## 2017-07-10 DIAGNOSIS — R161 Splenomegaly, not elsewhere classified: Secondary | ICD-10-CM

## 2017-07-10 DIAGNOSIS — I700 Atherosclerosis of aorta: Secondary | ICD-10-CM

## 2017-07-10 DIAGNOSIS — C7951 Secondary malignant neoplasm of bone: Secondary | ICD-10-CM

## 2017-07-10 DIAGNOSIS — Z79899 Other long term (current) drug therapy: Secondary | ICD-10-CM

## 2017-07-10 DIAGNOSIS — Z7984 Long term (current) use of oral hypoglycemic drugs: Secondary | ICD-10-CM

## 2017-07-10 DIAGNOSIS — J90 Pleural effusion, not elsewhere classified: Secondary | ICD-10-CM

## 2017-07-10 DIAGNOSIS — C50412 Malignant neoplasm of upper-outer quadrant of left female breast: Secondary | ICD-10-CM | POA: Diagnosis not present

## 2017-07-10 DIAGNOSIS — Z170 Estrogen receptor positive status [ER+]: Secondary | ICD-10-CM

## 2017-07-10 DIAGNOSIS — E119 Type 2 diabetes mellitus without complications: Secondary | ICD-10-CM

## 2017-07-10 DIAGNOSIS — G629 Polyneuropathy, unspecified: Secondary | ICD-10-CM

## 2017-07-10 DIAGNOSIS — I10 Essential (primary) hypertension: Secondary | ICD-10-CM

## 2017-07-10 DIAGNOSIS — R221 Localized swelling, mass and lump, neck: Secondary | ICD-10-CM

## 2017-07-10 DIAGNOSIS — E785 Hyperlipidemia, unspecified: Secondary | ICD-10-CM | POA: Diagnosis not present

## 2017-07-10 DIAGNOSIS — K766 Portal hypertension: Secondary | ICD-10-CM | POA: Diagnosis not present

## 2017-07-10 DIAGNOSIS — R000 Tachycardia, unspecified: Secondary | ICD-10-CM

## 2017-07-10 DIAGNOSIS — D649 Anemia, unspecified: Secondary | ICD-10-CM | POA: Diagnosis not present

## 2017-07-10 DIAGNOSIS — Z7982 Long term (current) use of aspirin: Secondary | ICD-10-CM

## 2017-07-10 DIAGNOSIS — Z5111 Encounter for antineoplastic chemotherapy: Secondary | ICD-10-CM | POA: Diagnosis not present

## 2017-07-10 DIAGNOSIS — Z5112 Encounter for antineoplastic immunotherapy: Secondary | ICD-10-CM | POA: Diagnosis not present

## 2017-07-10 LAB — COMPREHENSIVE METABOLIC PANEL
ALT: 22 U/L (ref 14–54)
ANION GAP: 6 (ref 5–15)
AST: 37 U/L (ref 15–41)
Albumin: 3.8 g/dL (ref 3.5–5.0)
Alkaline Phosphatase: 120 U/L (ref 38–126)
BUN: 9 mg/dL (ref 6–20)
CHLORIDE: 103 mmol/L (ref 101–111)
CO2: 26 mmol/L (ref 22–32)
CREATININE: 0.69 mg/dL (ref 0.44–1.00)
Calcium: 9 mg/dL (ref 8.9–10.3)
Glucose, Bld: 258 mg/dL — ABNORMAL HIGH (ref 65–99)
POTASSIUM: 3.7 mmol/L (ref 3.5–5.1)
Sodium: 135 mmol/L (ref 135–145)
Total Bilirubin: 0.5 mg/dL (ref 0.3–1.2)
Total Protein: 7 g/dL (ref 6.5–8.1)

## 2017-07-10 LAB — CBC WITH DIFFERENTIAL/PLATELET
Basophils Absolute: 0 10*3/uL (ref 0–0.1)
Basophils Relative: 1 %
EOS PCT: 2 %
Eosinophils Absolute: 0.1 10*3/uL (ref 0–0.7)
HCT: 32.5 % — ABNORMAL LOW (ref 35.0–47.0)
Hemoglobin: 10.6 g/dL — ABNORMAL LOW (ref 12.0–16.0)
LYMPHS ABS: 1.5 10*3/uL (ref 1.0–3.6)
LYMPHS PCT: 26 %
MCH: 27.1 pg (ref 26.0–34.0)
MCHC: 32.7 g/dL (ref 32.0–36.0)
MCV: 82.7 fL (ref 80.0–100.0)
MONO ABS: 0.7 10*3/uL (ref 0.2–0.9)
MONOS PCT: 12 %
Neutro Abs: 3.4 10*3/uL (ref 1.4–6.5)
Neutrophils Relative %: 59 %
PLATELETS: 216 10*3/uL (ref 150–440)
RBC: 3.93 MIL/uL (ref 3.80–5.20)
RDW: 21.7 % — AB (ref 11.5–14.5)
WBC: 5.7 10*3/uL (ref 3.6–11.0)

## 2017-07-10 MED ORDER — SODIUM CHLORIDE 0.9 % IV SOLN
80.0000 mg/m2 | Freq: Once | INTRAVENOUS | Status: AC
  Administered 2017-07-10: 138 mg via INTRAVENOUS
  Filled 2017-07-10: qty 23

## 2017-07-10 MED ORDER — SODIUM CHLORIDE 0.9 % IV SOLN
20.0000 mg | Freq: Once | INTRAVENOUS | Status: AC
Start: 2017-07-10 — End: 2017-07-10
  Administered 2017-07-10: 20 mg via INTRAVENOUS
  Filled 2017-07-10: qty 2

## 2017-07-10 MED ORDER — DIPHENHYDRAMINE HCL 50 MG/ML IJ SOLN
50.0000 mg | Freq: Once | INTRAMUSCULAR | Status: AC
  Administered 2017-07-10: 50 mg via INTRAVENOUS
  Filled 2017-07-10: qty 1

## 2017-07-10 MED ORDER — CEPHALEXIN 500 MG PO CAPS: 500.0000 mg | ORAL_CAPSULE | Freq: Three times a day (TID) | ORAL | 0 refills | Status: AC

## 2017-07-10 MED ORDER — HEPARIN SOD (PORK) LOCK FLUSH 100 UNIT/ML IV SOLN
500.0000 [IU] | Freq: Once | INTRAVENOUS | Status: AC
  Administered 2017-07-10: 500 [IU] via INTRAVENOUS
  Filled 2017-07-10: qty 5

## 2017-07-10 MED ORDER — TRASTUZUMAB CHEMO 150 MG IV SOLR
2.0000 mg/kg | Freq: Once | INTRAVENOUS | Status: AC
  Administered 2017-07-10: 126 mg via INTRAVENOUS
  Filled 2017-07-10: qty 6

## 2017-07-10 MED ORDER — SODIUM CHLORIDE 0.9% FLUSH
10.0000 mL | INTRAVENOUS | Status: DC | PRN
  Administered 2017-07-10: 10 mL via INTRAVENOUS
  Filled 2017-07-10: qty 10

## 2017-07-10 MED ORDER — FAMOTIDINE IN NACL 20-0.9 MG/50ML-% IV SOLN
20.0000 mg | Freq: Once | INTRAVENOUS | Status: AC
  Administered 2017-07-10: 20 mg via INTRAVENOUS
  Filled 2017-07-10: qty 50

## 2017-07-10 MED ORDER — ACETAMINOPHEN 325 MG PO TABS
650.0000 mg | ORAL_TABLET | Freq: Once | ORAL | Status: AC
  Administered 2017-07-10: 650 mg via ORAL
  Filled 2017-07-10: qty 2

## 2017-07-10 MED ORDER — SODIUM CHLORIDE 0.9 % IV SOLN
Freq: Once | INTRAVENOUS | Status: AC
  Administered 2017-07-10: 11:00:00 via INTRAVENOUS
  Filled 2017-07-10: qty 1000

## 2017-07-10 NOTE — Assessment & Plan Note (Addendum)
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 On taxol- Herceptin cycle weekly days-1,8,15 q 28days. March 26th 2017- CT scan - significant improvement.  Clinically no progression of disease noted.   # proceed with cycle #11 day 1 today. Tolerating chemo well;. Labs today reviewed;  acceptable for treatment today- mild anemia.   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 Monitor closely.   # right neck "lump"- start kelfex 500 TID, x10 days.    # July 20th- MUGA scan- 61% [Jan 2018-baseline 63%].   # Elevated Blood pressure- monitor closely; get cuff; bring log.   # Tachycardia- [May 9038] EKG revealed sinus tachycardia; with sinus arrhythmia. Stable.  # Bone metastases on X-geva [last 6/13]; will repeat next week [8/1]  # follow up with me in 2 weeks;chemo; CT scan prior. One week chemotherapy labs.

## 2017-07-10 NOTE — Progress Notes (Signed)
[place] OFFICE PROGRESS NOTE  Patient Care Team: Kathrine Haddock, NP as PCP - General (Nurse Practitioner) Bary Castilla Forest Gleason, MD (General Surgery) Forest Gleason, MD (Oncology)  Breast cancer [facility])   Staging form: Breast, AJCC 7th Edition     Clinical stage from 04/06/2015: Stage IV (T2, N2, M1) - Signed by Evlyn Kanner, NP on 04/06/2015    Oncology History   1.history of carcinoma of right breast status post lumpectomy and radiation therapy (upper and outer quadrant) February 21, 2009.  Estrogen receptor 90%.  Progesterone receptor 60%.  Patient had lumpectomy and MammoSite partial breast radiation therapy followed by tamoxifen for 5 years.  Patient is still taking tamoxifen.. 2 recent mammogram in December of 2015 negative.  Further evaluation by surgeon revealed a palpable mass in the left upper outer quadrant near axilla.  Ultrasound revealed one small normal-appearing lymph node 0.6 cm in lower limit of axilla.  Adjacent to this there was ill-defined multilobulated hypoechoic mass 1.8 x 2.6 cm.  Biopsy of the left axillary mass was done which was positive for high-grade carcinoma.   2,estrogen receptor positive.  Progesterone receptor positive.  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 3.  MRI scan of breast (January, 2015) reveals multiple abnormal enlarged left axillary lymph node There are 2 oval some circumscribed mass at 2:30 and 3:00 position of the left breast.  0.6 cm x 1.3 cm and 4 x 7 mm Non-mass enhancement extending 2.4 cm these masses is 3.6 cm mass.  Findings suggestive of several pulmonary nodules.  PET  scan shows extensive disease, 4.started on Taxotere,perjeta, Herceptin(January 03, 2015- June 2016.   # June 2016- Herceptin [Letrozole + Ibrance] until May 2017.   # MAY 2017- PET PROGRESSION; # July 2017- START KADCYLA  x4 cycles- SEP 15th 2017CT- PROGRESSION  # SEP 2017- START Taxol + Herceptin.   # Right sided Pleural effusion [s/p Thora Sep  2017]  # Bone mets- X-geva q 6W  # MUGA Jan 18th-63%     Carcinoma of upper-outer quadrant of left breast in female, estrogen receptor positive ([place])   09/28/2016 Initial Diagnosis    Carcinoma of upper-outer quadrant of left breast in female, estrogen receptor positive ([place])       INTERVAL HISTORY:  Tammy Pope 71 y.o.  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  She complains of mild tingling and numbness in hands. Denies any in the feet. She notes to have bleeding/redness of the right neck "cyst"  She continues to deny any worsening shortness of breath or cough. Denies any chest pain. No headache. No jaw pain. Denies any new lumps or bumps.No falls. No swelling in the legs. No headaches no stumbling or falling.   REVIEW OF SYSTEMS:  A complete 10 point review of system is done which is negative except mentioned above/history of present illness.   PAST MEDICAL HISTORY :  Past Medical History:  Diagnosis Date  . Axillary mass 12/15/2014  . Breast cancer ([place]) 02/21/2009  . Cancer [facility]) 2010   right breast ductal carcinoma in situ  . Diabetes mellitus without complication ([place])   . Floaters 2014  . Hyperlipidemia 1990  . Hypertension 2009  . Malignant neoplasm of axillary tail of female breast ([place]) 12/16/2014  . Malignant neoplasm of upper-outer quadrant of female breast ([place])  02/21/2009   DCIS of the right breast, intermediate grade, resected the negative margins. ER 90%, PR 50%, MammoSite partial breast radiation  . 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 Special screening for malignant neoplasms, colon     PAST SURGICAL HISTORY :   Past Surgical History:  Procedure Laterality Date  . APPENDECTOMY  1956  . BREAST BIOPSY Right 2010   stereo  .  BREAST SURGERY Right 2010   wide excision  . COLONOSCOPY  2008   ??? Md  . left axilla biopsy Left 12-14-14  . PARTIAL HYSTERECTOMY  1977    FAMILY HISTORY :   Family History  Problem Relation Age of Onset  . Cancer Other        unknown family members with ovarian,colon,breast cancers  . Hypertension Mother   . Heart disease Daughter        MI    SOCIAL HISTORY:   Social History  Substance Use Topics  . Smoking status: Never Smoker  . Smokeless tobacco: Never Used  . Alcohol use No    ALLERGIES:  is allergic to penicillin g and sulfa antibiotics.  MEDICATIONS:  Current Outpatient Prescriptions  Medication Sig Dispense Refill  . ALPRAZolam (XANAX) 0.5 MG tablet TAKE ONE TABLET BY MOUTH 3 TIMES DAILY AS NEEDED 90 tablet 1  . amLODipine (NORVASC) 5 MG tablet Take 1 tablet (5 mg total) by mouth daily. 90 tablet 3  . aspirin 81 MG tablet Take 81 mg by mouth daily.    . Calcium 600-200 MG-UNIT tablet Take 1 tablet by mouth 2 (two) times daily. 120 tablet 3  . metFORMIN (GLUCOPHAGE) 500 MG tablet Take 2 tablets (1,000 mg total) by mouth 2 (two) times daily with a meal. 360 tablet 5  . Multiple Vitamins-Minerals (MULTIVITAMIN ADULT PO) Take 1 tablet by mouth daily.     . cephALEXin (KEFLEX) 500 MG capsule Take 1 capsule (500 mg total) by mouth 3 (three) times daily. 30 capsule 0  . ONE TOUCH ULTRA TEST test strip     . potassium chloride SA (KLOR-CON M20) 20 MEQ tablet Take 2 tablets (40 mEq total) by mouth daily. 60 tablet 3  . rosuvastatin (CRESTOR) 5 MG tablet Take 1 tablet (5 mg total) by mouth at bedtime. 90 tablet 1  . valsartan-hydrochlorothiazide (DIOVAN-HCT) 320-25 MG tablet Take 1 tablet by mouth daily. 90 tablet 3   No current facility-administered medications for this visit.    Facility-Administered Medications Ordered in Other Visits  Medication Dose Route Frequency Provider Last Rate Last Dose  . heparin lock flush 100 unit/mL  250 Units Intracatheter PRN Choksi,  Janak, MD      . sodium chloride 0.9 % 250 mL with potassium chloride 40 mEq infusion   Intravenous Continuous Forest Gleason, MD   Stopped at 05/03/15 1229  . sodium chloride 0.9 % injection 10 mL  10 mL Intracatheter PRN Forest Gleason, MD   10 mL at 04/18/15 1545    PHYSICAL EXAMINATION: ECOG PERFORMANCE STATUS: 0 - Asymptomatic  BP (!) 169/91 (BP Location: Left Arm, Patient Position: Sitting)   Pulse (!) 106   Temp 98.3 F (36.8 C) (Tympanic)   Resp 16   Wt 131 lb 3.2 oz (59.5 kg)   LMP  (LMP Unknown)   BMI 23.54 kg/m   Filed Weights   07/10/17 1003  Weight: 131 lb 3.2 oz (59.5 kg)    GENERAL: Well-nourished well-developed; Alert, no distress and comfortable.  Accompanied by her husband/Daughter. EYES: no pallor or icterus OROPHARYNX: no thrush or ulceration; poor dentition  NECK: supple, no masses felt; neck right "cyst"- erythema / drainage LYMPH:  no palpable lymphadenopathy in the cervical, axillary or inguinal regions LUNGS: clear to auscultation and  No wheeze or crackles HEART/CVS: regular rate & rhythm and no murmurs; No lower extremity edema ABDOMEN:abdomen soft, non-tender and normal bowel sounds Musculoskeletal:no cyanosis of digits and no clubbing  PSYCH: alert & oriented x 3 with fluent speech NEURO: no focal motor/sensory deficits SKIN:  no rashes or significant lesions LEFT BREAST- 1-2cm mass left upper outer quadrant/axillary tail. Nontender. Skin: macular rash noted on the bilateral extremities  LABORATORY DATA:  I have reviewed the data as listed    Component Value Date/Time   NA 135 07/10/2017 0915   NA 141 06/03/2017 0925   NA 138 03/28/2015 0923   K 3.7 07/10/2017 0915   K 3.3 (L) 03/28/2015 0923   CL 103 07/10/2017 0915   CL 101 03/28/2015 0923   CO2 26 07/10/2017 0915   CO2 28 03/28/2015 0923   GLUCOSE 258 (H) 07/10/2017 0915   GLUCOSE 173 (H) 03/28/2015 0923   BUN 9 07/10/2017 0915   BUN 7 (L) 06/03/2017 0925   BUN 9 03/28/2015 0923    CREATININE 0.69 07/10/2017 0915   CREATININE 0.56 03/28/2015 0923   CALCIUM 9.0 07/10/2017 0915   CALCIUM 8.6 (L) 03/28/2015 0923   PROT 7.0 07/10/2017 0915   PROT 6.4 06/03/2017 0925   PROT 7.1 03/28/2015 0923   ALBUMIN 3.8 07/10/2017 0915   ALBUMIN 4.0 06/03/2017 0925   ALBUMIN 3.6 03/28/2015 0923   AST 37 07/10/2017 0915   AST 43 (H) 03/28/2015 0923   ALT 22 07/10/2017 0915   ALT 26 03/28/2015 0923   ALKPHOS 120 07/10/2017 0915   ALKPHOS 85 03/28/2015 0923   BILITOT 0.5 07/10/2017 0915   BILITOT 0.3 06/03/2017 0925   BILITOT 0.6 03/28/2015 0923   GFRNONAA >60 07/10/2017 0915   GFRNONAA >60 03/28/2015 0923   GFRAA >60 07/10/2017 0915   GFRAA >60 03/28/2015 0923    No results found for: SPEP, UPEP  Lab Results  Component Value Date   WBC 5.7 07/10/2017   NEUTROABS 3.4 07/10/2017   HGB 10.6 (L) 07/10/2017   HCT 32.5 (L) 07/10/2017   MCV 82.7 07/10/2017   PLT 216 07/10/2017      Chemistry      Component Value Date/Time   NA 135 07/10/2017 0915   NA 141 06/03/2017 0925   NA 138 03/28/2015 0923   K 3.7 07/10/2017 0915   K 3.3 (L) 03/28/2015 0923   CL 103 07/10/2017 0915   CL 101 03/28/2015 0923   CO2 26 07/10/2017 0915   CO2 28 03/28/2015 0923   BUN 9 07/10/2017 0915   BUN 7 (L) 06/03/2017 0925   BUN 9 03/28/2015 0923   CREATININE 0.69 07/10/2017 0915   CREATININE 0.56 03/28/2015 0923      Component Value Date/Time   CALCIUM 9.0 07/10/2017 0915   CALCIUM 8.6 (L) 03/28/2015 0923   ALKPHOS 120 07/10/2017 0915   ALKPHOS 85 03/28/2015 0923   AST 37 07/10/2017 0915   AST 43 (H) 03/28/2015 0923   ALT 22 07/10/2017 0915   ALT 26 03/28/2015 0923   BILITOT 0.5 07/10/2017 0915   BILITOT 0.3 06/03/2017 0925   BILITOT 0.6 03/28/2015 0923     IMPRESSION: 1. Overall, today's study demonstrates similar findings to the  prior examination from January 2018. Specifically, there is widespread metastatic disease to the bones which appears essentially unchanged. No  other sites of extraskeletal metastatic disease are noted on today's examination. 2. Morphologic changes in the liver compatible with underlying cirrhosis redemonstrated. This is associated with evidence of portal hypertension, including a dilated portal vein and splenomegaly. 3. 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 4. Aortic atherosclerosis, in addition to two vessel coronary artery disease. Assessment for potential risk factor modification, dietary therapy or pharmacologic therapy may be warranted, if clinically indicated. 5. Additional incidental findings, as above.   Electronically Signed   By: Vinnie Langton M.D.   On: 03/11/2017 13:28  IMPRESSION: Normal LEFT ventricular ejection fraction of 58%, slightly decreased from the 63% on the prior exam.  Normal LEFT ventricular wall motion.  Mild splenic enlargement.  ASSESSMENT & PLAN:  Carcinoma of upper-outer quadrant of left breast in female, estrogen receptor positive ([place]) Metastatic breast cancer to the bone/lung. On taxol- Herceptin cycle weekly days-1,8,15 q 28days. March 26th 2017- CT scan - significant improvement.  Clinically no progression of disease noted.   # proceed with cycle #11 day 1 today. Tolerating chemo well;. Labs today reviewed;  acceptable for treatment today- mild anemia.   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 Monitor closely.   # right neck "lump"- start kelfex 500 TID, x10 days.    # July 20th- MUGA scan- 61% [Jan 2018-baseline 63%].   # Elevated Blood pressure- monitor closely; get cuff; bring log.   # Tachycardia- [May 7915] EKG revealed sinus tachycardia; with sinus arrhythmia. Stable.  # Bone metastases on X-geva [last 6/13]; will repeat next week [8/1]  # follow up with me in 2 weeks;chemo; CT scan prior.  One week chemotherapy labs.  Orders Placed This Encounter  Procedures  . CT ABDOMEN PELVIS W CONTRAST    Standing Status:   Future    Standing Expiration Date:   10/09/2018    Order Specific Question:   Reason for Exam (SYMPTOM  OR DIAGNOSIS REQUIRED)    Answer:   breast cancer    Order Specific Question:   Preferred imaging location?    Answer:   [facility]  . CT CHEST W CONTRAST    Standing Status:   Future    Standing Expiration Date:   09/09/2018    Order Specific Question:   Reason for Exam (SYMPTOM  OR DIAGNOSIS REQUIRED)    Answer:   breast cancer    Order Specific Question:   Preferred imaging location?    Answer:   [facility]       Cammie Sickle, MD 07/10/2017 4:58 PM

## 2017-07-10 NOTE — Progress Notes (Signed)
Patient is here for a follow up today.

## 2017-07-17 ENCOUNTER — Inpatient Hospital Stay: Payer: Medicare Other

## 2017-07-17 ENCOUNTER — Inpatient Hospital Stay: Payer: Medicare Other | Attending: Internal Medicine

## 2017-07-17 VITALS — BP 150/75 | HR 94 | Temp 96.9°F | Resp 20

## 2017-07-17 DIAGNOSIS — K7460 Unspecified cirrhosis of liver: Secondary | ICD-10-CM | POA: Insufficient documentation

## 2017-07-17 DIAGNOSIS — Z170 Estrogen receptor positive status [ER+]: Secondary | ICD-10-CM | POA: Insufficient documentation

## 2017-07-17 DIAGNOSIS — C50412 Malignant neoplasm of upper-outer quadrant of left female breast: Secondary | ICD-10-CM

## 2017-07-17 DIAGNOSIS — I10 Essential (primary) hypertension: Secondary | ICD-10-CM | POA: Diagnosis not present

## 2017-07-17 DIAGNOSIS — J90 Pleural effusion, not elsewhere classified: Secondary | ICD-10-CM | POA: Diagnosis not present

## 2017-07-17 DIAGNOSIS — E1165 Type 2 diabetes mellitus with hyperglycemia: Secondary | ICD-10-CM | POA: Diagnosis not present

## 2017-07-17 DIAGNOSIS — Z79899 Other long term (current) drug therapy: Secondary | ICD-10-CM | POA: Insufficient documentation

## 2017-07-17 DIAGNOSIS — Z809 Family history of malignant neoplasm, unspecified: Secondary | ICD-10-CM | POA: Insufficient documentation

## 2017-07-17 DIAGNOSIS — Z5111 Encounter for antineoplastic chemotherapy: Secondary | ICD-10-CM | POA: Diagnosis not present

## 2017-07-17 DIAGNOSIS — Z5112 Encounter for antineoplastic immunotherapy: Secondary | ICD-10-CM | POA: Insufficient documentation

## 2017-07-17 DIAGNOSIS — Z7982 Long term (current) use of aspirin: Secondary | ICD-10-CM | POA: Diagnosis not present

## 2017-07-17 DIAGNOSIS — G629 Polyneuropathy, unspecified: Secondary | ICD-10-CM | POA: Diagnosis not present

## 2017-07-17 DIAGNOSIS — D649 Anemia, unspecified: Secondary | ICD-10-CM | POA: Insufficient documentation

## 2017-07-17 DIAGNOSIS — R161 Splenomegaly, not elsewhere classified: Secondary | ICD-10-CM | POA: Insufficient documentation

## 2017-07-17 DIAGNOSIS — I700 Atherosclerosis of aorta: Secondary | ICD-10-CM | POA: Diagnosis not present

## 2017-07-17 DIAGNOSIS — C7951 Secondary malignant neoplasm of bone: Secondary | ICD-10-CM | POA: Diagnosis not present

## 2017-07-17 LAB — COMPREHENSIVE METABOLIC PANEL
ALBUMIN: 3.8 g/dL (ref 3.5–5.0)
ALT: 30 U/L (ref 14–54)
ANION GAP: 9 (ref 5–15)
AST: 45 U/L — AB (ref 15–41)
Alkaline Phosphatase: 112 U/L (ref 38–126)
BUN: 9 mg/dL (ref 6–20)
CHLORIDE: 104 mmol/L (ref 101–111)
CO2: 26 mmol/L (ref 22–32)
Calcium: 9.1 mg/dL (ref 8.9–10.3)
Creatinine, Ser: 0.67 mg/dL (ref 0.44–1.00)
GFR calc non Af Amer: >60 mL/min (ref >60)
Glucose, Bld: 229 mg/dL — ABNORMAL HIGH (ref 65–99)
Potassium: 3.9 mmol/L (ref 3.5–5.1)
Sodium: 139 mmol/L (ref 135–145)
Total Bilirubin: 0.6 mg/dL (ref 0.3–1.2)
Total Protein: 7 g/dL (ref 6.5–8.1)

## 2017-07-17 LAB — CBC WITH DIFFERENTIAL/PLATELET
BASOS ABS: 0.1 10*3/uL (ref 0–0.1)
Basophils Relative: 1 %
Eosinophils Absolute: 0.2 10*3/uL (ref 0–0.7)
Eosinophils Relative: 3 %
HEMATOCRIT: 31.1 % — AB (ref 35.0–47.0)
HEMOGLOBIN: 10.3 g/dL — AB (ref 12.0–16.0)
LYMPHS PCT: 20 %
Lymphs Abs: 1.4 10*3/uL (ref 1.0–3.6)
MCH: 26.9 pg (ref 26.0–34.0)
MCHC: 33 g/dL (ref 32.0–36.0)
MCV: 81.4 fL (ref 80.0–100.0)
Monocytes Absolute: 0.3 10*3/uL (ref 0.2–0.9)
Monocytes Relative: 4 %
NEUTROS ABS: 5 10*3/uL (ref 1.4–6.5)
NEUTROS PCT: 72 %
Platelets: 208 10*3/uL (ref 150–440)
RBC: 3.82 MIL/uL (ref 3.80–5.20)
RDW: 21.4 % — ABNORMAL HIGH (ref 11.5–14.5)
WBC: 6.9 10*3/uL (ref 3.6–11.0)

## 2017-07-17 MED ORDER — SODIUM CHLORIDE 0.9 % IV SOLN
Freq: Once | INTRAVENOUS | Status: AC
  Administered 2017-07-17: 09:00:00 via INTRAVENOUS
  Filled 2017-07-17: qty 1000

## 2017-07-17 MED ORDER — HEPARIN SOD (PORK) LOCK FLUSH 100 UNIT/ML IV SOLN
500.0000 [IU] | Freq: Once | INTRAVENOUS | Status: AC
  Administered 2017-07-17: 500 [IU] via INTRAVENOUS
  Filled 2017-07-17: qty 5

## 2017-07-17 MED ORDER — SODIUM CHLORIDE 0.9 % IV SOLN
2.0000 mg/kg | Freq: Once | INTRAVENOUS | Status: AC
  Administered 2017-07-17: 126 mg via INTRAVENOUS
  Filled 2017-07-17: qty 6

## 2017-07-17 MED ORDER — SODIUM CHLORIDE 0.9% FLUSH
10.0000 mL | INTRAVENOUS | Status: DC | PRN
  Administered 2017-07-17: 10 mL
  Filled 2017-07-17: qty 10

## 2017-07-17 MED ORDER — FAMOTIDINE IN NACL 20-0.9 MG/50ML-% IV SOLN
20.0000 mg | Freq: Once | INTRAVENOUS | Status: AC
  Administered 2017-07-17: 20 mg via INTRAVENOUS
  Filled 2017-07-17: qty 50

## 2017-07-17 MED ORDER — HEPARIN SOD (PORK) LOCK FLUSH 100 UNIT/ML IV SOLN: 500.0000 [IU] | Freq: Once | INTRAVENOUS | Status: DC | PRN

## 2017-07-17 MED ORDER — SODIUM CHLORIDE 0.9 % IV SOLN
20.0000 mg | Freq: Once | INTRAVENOUS | Status: AC
  Administered 2017-07-17: 20 mg via INTRAVENOUS
  Filled 2017-07-17: qty 2

## 2017-07-17 MED ORDER — DIPHENHYDRAMINE HCL 50 MG/ML IJ SOLN
50.0000 mg | Freq: Once | INTRAMUSCULAR | Status: AC
  Administered 2017-07-17: 50 mg via INTRAVENOUS
  Filled 2017-07-17: qty 1

## 2017-07-17 MED ORDER — SODIUM CHLORIDE 0.9% FLUSH
10.0000 mL | Freq: Once | INTRAVENOUS | Status: AC
  Administered 2017-07-17: 10 mL via INTRAVENOUS
  Filled 2017-07-17: qty 10

## 2017-07-17 MED ORDER — ACETAMINOPHEN 325 MG PO TABS
650.0000 mg | ORAL_TABLET | Freq: Once | ORAL | Status: AC
  Administered 2017-07-17: 650 mg via ORAL
  Filled 2017-07-17: qty 2

## 2017-07-17 MED ORDER — SODIUM CHLORIDE 0.9 % IV SOLN
80.0000 mg/m2 | Freq: Once | INTRAVENOUS | Status: AC
  Administered 2017-07-17: 138 mg via INTRAVENOUS
  Filled 2017-07-17: qty 23

## 2017-07-17 NOTE — Progress Notes (Signed)
Per pharmacy, xgeva was last administered on 06/26/17 and is not due today since it is given every 6 weeks per notes from Dr. Rogue Bussing.

## 2017-07-22 ENCOUNTER — Ambulatory Visit
Admission: RE | Admit: 2017-07-22 | Discharge: 2017-07-22 | Disposition: A | Discharge status: Home / Self Care | Payer: Medicare Other | Source: Ambulatory Visit | Attending: Internal Medicine | Admitting: Internal Medicine

## 2017-07-22 DIAGNOSIS — I700 Atherosclerosis of aorta: Secondary | ICD-10-CM | POA: Insufficient documentation

## 2017-07-22 DIAGNOSIS — C7951 Secondary malignant neoplasm of bone: Secondary | ICD-10-CM | POA: Insufficient documentation

## 2017-07-22 DIAGNOSIS — Z170 Estrogen receptor positive status [ER+]: Secondary | ICD-10-CM | POA: Diagnosis not present

## 2017-07-22 DIAGNOSIS — R918 Other nonspecific abnormal finding of lung field: Secondary | ICD-10-CM | POA: Diagnosis not present

## 2017-07-22 DIAGNOSIS — C50412 Malignant neoplasm of upper-outer quadrant of left female breast: Secondary | ICD-10-CM | POA: Insufficient documentation

## 2017-07-22 DIAGNOSIS — K7460 Unspecified cirrhosis of liver: Secondary | ICD-10-CM | POA: Insufficient documentation

## 2017-07-22 MED ORDER — IOPAMIDOL (ISOVUE-300) INJECTION 61%
100.0000 mL | Freq: Once | INTRAVENOUS | Status: AC | PRN
  Administered 2017-07-22: 100 mL via INTRAVENOUS

## 2017-07-24 ENCOUNTER — Inpatient Hospital Stay: Payer: Medicare Other

## 2017-07-24 ENCOUNTER — Inpatient Hospital Stay (HOSPITAL_BASED_OUTPATIENT_CLINIC_OR_DEPARTMENT_OTHER): Payer: Medicare Other | Admitting: Internal Medicine

## 2017-07-24 VITALS — BP 154/78 | HR 103 | Temp 97.0°F | Resp 103 | Wt 131.8 lb

## 2017-07-24 VITALS — Resp 20

## 2017-07-24 DIAGNOSIS — C50412 Malignant neoplasm of upper-outer quadrant of left female breast: Secondary | ICD-10-CM | POA: Diagnosis not present

## 2017-07-24 DIAGNOSIS — E119 Type 2 diabetes mellitus without complications: Secondary | ICD-10-CM

## 2017-07-24 DIAGNOSIS — R161 Splenomegaly, not elsewhere classified: Secondary | ICD-10-CM

## 2017-07-24 DIAGNOSIS — Z170 Estrogen receptor positive status [ER+]: Secondary | ICD-10-CM | POA: Diagnosis not present

## 2017-07-24 DIAGNOSIS — C7951 Secondary malignant neoplasm of bone: Secondary | ICD-10-CM

## 2017-07-24 DIAGNOSIS — Z809 Family history of malignant neoplasm, unspecified: Secondary | ICD-10-CM

## 2017-07-24 DIAGNOSIS — I700 Atherosclerosis of aorta: Secondary | ICD-10-CM

## 2017-07-24 DIAGNOSIS — J90 Pleural effusion, not elsewhere classified: Secondary | ICD-10-CM | POA: Diagnosis not present

## 2017-07-24 DIAGNOSIS — I10 Essential (primary) hypertension: Secondary | ICD-10-CM

## 2017-07-24 DIAGNOSIS — Z5111 Encounter for antineoplastic chemotherapy: Secondary | ICD-10-CM | POA: Diagnosis not present

## 2017-07-24 DIAGNOSIS — K7460 Unspecified cirrhosis of liver: Secondary | ICD-10-CM

## 2017-07-24 DIAGNOSIS — Z79899 Other long term (current) drug therapy: Secondary | ICD-10-CM

## 2017-07-24 DIAGNOSIS — G629 Polyneuropathy, unspecified: Secondary | ICD-10-CM

## 2017-07-24 DIAGNOSIS — D649 Anemia, unspecified: Secondary | ICD-10-CM

## 2017-07-24 DIAGNOSIS — Z7982 Long term (current) use of aspirin: Secondary | ICD-10-CM | POA: Diagnosis not present

## 2017-07-24 DIAGNOSIS — Z5112 Encounter for antineoplastic immunotherapy: Secondary | ICD-10-CM | POA: Diagnosis not present

## 2017-07-24 LAB — CBC WITH DIFFERENTIAL/PLATELET
Basophils Absolute: 0 10*3/uL (ref 0–0.1)
Basophils Relative: 1 %
EOS ABS: 0.2 10*3/uL (ref 0–0.7)
EOS PCT: 6 %
HCT: 28.2 % — ABNORMAL LOW (ref 35.0–47.0)
Hemoglobin: 9.5 g/dL — ABNORMAL LOW (ref 12.0–16.0)
LYMPHS ABS: 1.1 10*3/uL (ref 1.0–3.6)
Lymphocytes Relative: 32 %
MCH: 27.5 pg (ref 26.0–34.0)
MCHC: 33.8 g/dL (ref 32.0–36.0)
MCV: 81.4 fL (ref 80.0–100.0)
MONOS PCT: 8 %
Monocytes Absolute: 0.3 10*3/uL (ref 0.2–0.9)
NEUTROS PCT: 53 %
Neutro Abs: 1.9 10*3/uL (ref 1.4–6.5)
PLATELETS: 174 10*3/uL (ref 150–440)
RBC: 3.46 MIL/uL — ABNORMAL LOW (ref 3.80–5.20)
RDW: 21.9 % — ABNORMAL HIGH (ref 11.5–14.5)
WBC: 3.6 10*3/uL (ref 3.6–11.0)

## 2017-07-24 LAB — COMPREHENSIVE METABOLIC PANEL
ALBUMIN: 3.6 g/dL (ref 3.5–5.0)
ALK PHOS: 106 U/L (ref 38–126)
ALT: 26 U/L (ref 14–54)
AST: 40 U/L (ref 15–41)
Anion gap: 9 (ref 5–15)
BUN: 10 mg/dL (ref 6–20)
CALCIUM: 8.8 mg/dL — AB (ref 8.9–10.3)
CHLORIDE: 98 mmol/L — AB (ref 101–111)
CO2: 26 mmol/L (ref 22–32)
CREATININE: 0.63 mg/dL (ref 0.44–1.00)
GFR calc Af Amer: >60 mL/min (ref >60)
GFR calc non Af Amer: >60 mL/min (ref >60)
GLUCOSE: 238 mg/dL — AB (ref 65–99)
Potassium: 3.3 mmol/L — ABNORMAL LOW (ref 3.5–5.1)
SODIUM: 133 mmol/L — AB (ref 135–145)
Total Bilirubin: 0.5 mg/dL (ref 0.3–1.2)
Total Protein: 6.7 g/dL (ref 6.5–8.1)

## 2017-07-24 MED ORDER — FAMOTIDINE IN NACL 20-0.9 MG/50ML-% IV SOLN
20.0000 mg | Freq: Once | INTRAVENOUS | Status: AC
  Administered 2017-07-24: 20 mg via INTRAVENOUS
  Filled 2017-07-24: qty 50

## 2017-07-24 MED ORDER — DIPHENHYDRAMINE HCL 50 MG/ML IJ SOLN
50.0000 mg | Freq: Once | INTRAMUSCULAR | Status: AC
  Administered 2017-07-24: 50 mg via INTRAVENOUS
  Filled 2017-07-24: qty 1

## 2017-07-24 MED ORDER — SODIUM CHLORIDE 0.9 % IV SOLN
Freq: Once | INTRAVENOUS | Status: AC
  Administered 2017-07-24: 10:00:00 via INTRAVENOUS
  Filled 2017-07-24: qty 1000

## 2017-07-24 MED ORDER — SODIUM CHLORIDE 0.9 % IV SOLN
80.0000 mg/m2 | Freq: Once | INTRAVENOUS | Status: AC
  Administered 2017-07-24: 138 mg via INTRAVENOUS
  Filled 2017-07-24: qty 23

## 2017-07-24 MED ORDER — SODIUM CHLORIDE 0.9% FLUSH
10.0000 mL | INTRAVENOUS | Status: DC | PRN
  Administered 2017-07-24: 10 mL via INTRAVENOUS
  Filled 2017-07-24: qty 10

## 2017-07-24 MED ORDER — ACETAMINOPHEN 325 MG PO TABS
650.0000 mg | ORAL_TABLET | Freq: Once | ORAL | Status: AC
  Administered 2017-07-24: 650 mg via ORAL
  Filled 2017-07-24: qty 2

## 2017-07-24 MED ORDER — SODIUM CHLORIDE 0.9 % IV SOLN
20.0000 mg | Freq: Once | INTRAVENOUS | Status: AC
  Administered 2017-07-24: 20 mg via INTRAVENOUS
  Filled 2017-07-24: qty 2

## 2017-07-24 MED ORDER — TRASTUZUMAB CHEMO 150 MG IV SOLR
2.0000 mg/kg | Freq: Once | INTRAVENOUS | Status: AC
  Administered 2017-07-24: 126 mg via INTRAVENOUS
  Filled 2017-07-24: qty 6

## 2017-07-24 MED ORDER — HEPARIN SOD (PORK) LOCK FLUSH 100 UNIT/ML IV SOLN
500.0000 [IU] | Freq: Once | INTRAVENOUS | Status: AC
  Administered 2017-07-24: 500 [IU] via INTRAVENOUS
  Filled 2017-07-24: qty 5

## 2017-07-24 NOTE — Assessment & Plan Note (Addendum)
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 On taxol- Herceptin cycle weekly days-1,8,15 q 28days. AUG 6th- CT scan - significant improvement.  Clinically no progression of disease noted.   # Proceed with chemotherapy today. Tolerating chemo well;. Labs today reviewed;  acceptable for treatment today- mild anemia.   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 Monitor closely.    # July 20th- MUGA scan- 61% [Jan 2018-baseline 63%].   # Elevated Blood pressure- monitor closely; get cuff; bring log.   # Bone metastases on X-geva [last 7/11]  # follow up with me in 2 weeks;chemo. Xgeva at next visit.  # I reviewed the blood work- with the patient in detail; also reviewed the imaging independently [as summarized above]; and with the patient in detail.

## 2017-07-24 NOTE — Progress Notes (Signed)
[place] OFFICE PROGRESS NOTE  Patient Care Team: Kathrine Haddock, NP as PCP - General (Nurse Practitioner) Bary Castilla Forest Gleason, MD (General Surgery) Forest Gleason, MD (Oncology)  Breast cancer [facility])   Staging form: Breast, AJCC 7th Edition     Clinical stage from 04/06/2015: Stage IV (T2, N2, M1) - Signed by Evlyn Kanner, NP on 04/06/2015    Oncology History   1.history of carcinoma of right breast status post lumpectomy and radiation therapy (upper and outer quadrant) February 21, 2009.  Estrogen receptor 90%.  Progesterone receptor 60%.  Patient had lumpectomy and MammoSite partial breast radiation therapy followed by tamoxifen for 5 years.  Patient is still taking tamoxifen.. 2 recent mammogram in December of 2015 negative.  Further evaluation by surgeon revealed a palpable mass in the left upper outer quadrant near axilla.  Ultrasound revealed one small normal-appearing lymph node 0.6 cm in lower limit of axilla.  Adjacent to this there was ill-defined multilobulated hypoechoic mass 1.8 x 2.6 cm.  Biopsy of the left axillary mass was done which was positive for high-grade carcinoma.   2,estrogen receptor positive.  Progesterone receptor positive.  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 3.  MRI scan of breast (January, 2015) reveals multiple abnormal enlarged left axillary lymph node There are 2 oval some circumscribed mass at 2:30 and 3:00 position of the left breast.  0.6 cm x 1.3 cm and 4 x 7 mm Non-mass enhancement extending 2.4 cm these masses is 3.6 cm mass.  Findings suggestive of several pulmonary nodules.  PET  scan shows extensive disease, 4.started on Taxotere,perjeta, Herceptin(January 03, 2015- June 2016.   # June 2016- Herceptin [Letrozole + Ibrance] until May 2017.   # MAY 2017- PET PROGRESSION; # July 2017- START KADCYLA  x4 cycles- SEP 15th 2017CT- PROGRESSION  # SEP 2017- START Taxol + Herceptin.   # Right sided Pleural effusion [s/p Thora Sep  2017]  # Bone mets- X-geva q 6W  # MUGA Jan 18th-63%     Carcinoma of upper-outer quadrant of left breast in female, estrogen receptor positive ([place])   09/28/2016 Initial Diagnosis    Carcinoma of upper-outer quadrant of left breast in female, estrogen receptor positive ([place])       INTERVAL HISTORY:  Tammy Pope 71 y.o.  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  She complains of mild tingling and numbness in hands. Denies any tingling and numbness in the feet. The mild tingling and numbness does not interfere with her daily activities. No falls.  She continues to deny any worsening shortness of breath or cough. Denies any chest pain. No headache. No jaw pain. Denies any new lumps or bumps.No falls. No swelling in the legs. No headaches no stumbling or falling.   REVIEW OF SYSTEMS:  A complete 10 point review of system is done which is negative except mentioned above/history of present illness.   PAST MEDICAL HISTORY :  Past Medical History:  Diagnosis Date  . Axillary mass 12/15/2014  . Breast cancer ([place]) 02/21/2009  . Cancer [facility]) 2010   right breast ductal carcinoma in situ  . Diabetes mellitus without complication ([place])   . Floaters 2014  . Hyperlipidemia 1990  . Hypertension 2009  . Malignant neoplasm of axillary tail of female breast ([place]) 12/16/2014  .  Malignant neoplasm of upper-outer quadrant of female breast ([place]) 02/21/2009   DCIS of the right breast, intermediate grade, resected the negative margins. ER 90%, PR 50%, MammoSite partial breast radiation  . 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 Special screening for malignant neoplasms, colon     PAST SURGICAL HISTORY :   Past Surgical History:  Procedure Laterality Date   . APPENDECTOMY  1956  . BREAST BIOPSY Right 2010   stereo  . BREAST SURGERY Right 2010   wide excision  . COLONOSCOPY  2008   ??? Md  . left axilla biopsy Left 12-14-14  . PARTIAL HYSTERECTOMY  1977    FAMILY HISTORY :   Family History  Problem Relation Age of Onset  . Cancer Other        unknown family members with ovarian,colon,breast cancers  . Hypertension Mother   . Heart disease Daughter        MI    SOCIAL HISTORY:   Social History  Substance Use Topics  . Smoking status: Never Smoker  . Smokeless tobacco: Never Used  . Alcohol use No    ALLERGIES:  is allergic to penicillin g and sulfa antibiotics.  MEDICATIONS:  Current Outpatient Prescriptions  Medication Sig Dispense Refill  . ALPRAZolam (XANAX) 0.5 MG tablet TAKE ONE TABLET BY MOUTH 3 TIMES DAILY AS NEEDED 90 tablet 1  . amLODipine (NORVASC) 5 MG tablet Take 1 tablet (5 mg total) by mouth daily. 90 tablet 3  . aspirin 81 MG tablet Take 81 mg by mouth daily.    . Calcium 600-200 MG-UNIT tablet Take 1 tablet by mouth 2 (two) times daily. 120 tablet 3  . cephALEXin (KEFLEX) 500 MG capsule Take 1 capsule (500 mg total) by mouth 3 (three) times daily. 30 capsule 0  . metFORMIN (GLUCOPHAGE) 500 MG tablet Take 2 tablets (1,000 mg total) by mouth 2 (two) times daily with a meal. 360 tablet 5  . Multiple Vitamins-Minerals (MULTIVITAMIN ADULT PO) Take 1 tablet by mouth daily.     . ONE TOUCH ULTRA TEST test strip     . potassium chloride SA (KLOR-CON M20) 20 MEQ tablet Take 2 tablets (40 mEq total) by mouth daily. 60 tablet 3  . rosuvastatin (CRESTOR) 5 MG tablet Take 1 tablet (5 mg total) by mouth at bedtime. 90 tablet 1  . valsartan-hydrochlorothiazide (DIOVAN-HCT) 320-25 MG tablet Take 1 tablet by mouth daily. 90 tablet 3   No current facility-administered medications for this visit.    Facility-Administered Medications Ordered in Other Visits  Medication Dose Route Frequency Provider Last Rate Last Dose  .  heparin lock flush 100 unit/mL  250 Units Intracatheter PRN Choksi, Janak, MD      . sodium chloride 0.9 % 250 mL with potassium chloride 40 mEq infusion   Intravenous Continuous Forest Gleason, MD   Stopped at 05/03/15 1229  . sodium chloride 0.9 % injection 10 mL  10 mL Intracatheter PRN Forest Gleason, MD   10 mL at 04/18/15 1545    PHYSICAL EXAMINATION: ECOG PERFORMANCE STATUS: 0 - Asymptomatic  BP (!) 154/78 (BP Location: Left Arm, Patient Position: Sitting)   Pulse (!) 103   Temp (!) 97 F (36.1 C) (Tympanic)   Resp (!) 103   Wt 131 lb 12.8 oz (59.8 kg)   LMP  (LMP Unknown)   BMI 23.65 kg/m   Filed Weights   07/24/17 0901  Weight: 131 lb 12.8 oz (59.8 kg)  GENERAL: Well-nourished well-developed; Alert, no distress and comfortable.   Accompanied by her husband/Daughter. EYES: no pallor or icterus OROPHARYNX: no thrush or ulceration; poor dentition  NECK: supple, no masses felt.  LYMPH:  no palpable lymphadenopathy in the cervical, axillary or inguinal regions LUNGS: clear to auscultation and  No wheeze or crackles HEART/CVS: regular rate & rhythm and no murmurs; No lower extremity edema ABDOMEN:abdomen soft, non-tender and normal bowel sounds Musculoskeletal:no cyanosis of digits and no clubbing  PSYCH: alert & oriented x 3 with fluent speech NEURO: no focal motor/sensory deficits SKIN:  no rashes or significant lesions LEFT BREAST- 1-2cm mass left upper outer quadrant/axillary tail. Nontender. Skin: macular rash noted on the bilateral extremities  LABORATORY DATA:  I have reviewed the data as listed    Component Value Date/Time   NA 133 (L) 07/24/2017 0840   NA 141 06/03/2017 0925   NA 138 03/28/2015 0923   K 3.3 (L) 07/24/2017 0840   K 3.3 (L) 03/28/2015 0923   CL 98 (L) 07/24/2017 0840   CL 101 03/28/2015 0923   CO2 26 07/24/2017 0840   CO2 28 03/28/2015 0923   GLUCOSE 238 (H) 07/24/2017 0840   GLUCOSE 173 (H) 03/28/2015 0923   BUN 10 07/24/2017 0840    BUN 7 (L) 06/03/2017 0925   BUN 9 03/28/2015 0923   CREATININE 0.63 07/24/2017 0840   CREATININE 0.56 03/28/2015 0923   CALCIUM 8.8 (L) 07/24/2017 0840   CALCIUM 8.6 (L) 03/28/2015 0923   PROT 6.7 07/24/2017 0840   PROT 6.4 06/03/2017 0925   PROT 7.1 03/28/2015 0923   ALBUMIN 3.6 07/24/2017 0840   ALBUMIN 4.0 06/03/2017 0925   ALBUMIN 3.6 03/28/2015 0923   AST 40 07/24/2017 0840   AST 43 (H) 03/28/2015 0923   ALT 26 07/24/2017 0840   ALT 26 03/28/2015 0923   ALKPHOS 106 07/24/2017 0840   ALKPHOS 85 03/28/2015 0923   BILITOT 0.5 07/24/2017 0840   BILITOT 0.3 06/03/2017 0925   BILITOT 0.6 03/28/2015 0923   GFRNONAA >60 07/24/2017 0840   GFRNONAA >60 03/28/2015 0923   GFRAA >60 07/24/2017 0840   GFRAA >60 03/28/2015 0923    No results found for: SPEP, UPEP  Lab Results  Component Value Date   WBC 3.6 07/24/2017   NEUTROABS 1.9 07/24/2017   HGB 9.5 (L) 07/24/2017   HCT 28.2 (L) 07/24/2017   MCV 81.4 07/24/2017   PLT 174 07/24/2017      Chemistry      Component Value Date/Time   NA 133 (L) 07/24/2017 0840   NA 141 06/03/2017 0925   NA 138 03/28/2015 0923   K 3.3 (L) 07/24/2017 0840   K 3.3 (L) 03/28/2015 0923   CL 98 (L) 07/24/2017 0840   CL 101 03/28/2015 0923   CO2 26 07/24/2017 0840   CO2 28 03/28/2015 0923   BUN 10 07/24/2017 0840   BUN 7 (L) 06/03/2017 0925   BUN 9 03/28/2015 0923   CREATININE 0.63 07/24/2017 0840   CREATININE 0.56 03/28/2015 0923      Component Value Date/Time   CALCIUM 8.8 (L) 07/24/2017 0840   CALCIUM 8.6 (L) 03/28/2015 0923   ALKPHOS 106 07/24/2017 0840   ALKPHOS 85 03/28/2015 0923   AST 40 07/24/2017 0840   AST 43 (H) 03/28/2015 0923   ALT 26 07/24/2017 0840   ALT 26 03/28/2015 0923   BILITOT 0.5 07/24/2017 0840   BILITOT 0.3 06/03/2017 0925   BILITOT 0.6 03/28/2015 9169  IMPRESSION: 1. No new or progressive metastatic disease. Stable widespread patchy confluent sclerotic osseous metastases throughout the axial and  proximal appendicular skeleton. 2. 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 3. Stable morphologic changes of cirrhosis without discrete liver lesions. 4.  Aortic Atherosclerosis (ICD10-I70.0).   Electronically Signed   By: Ilona Sorrel M.D.   On: 07/22/2017 10:02  IMPRESSION: Normal LEFT ventricular ejection fraction of 58%, slightly decreased from the 63% on the prior exam.  Normal LEFT ventricular wall motion.  Mild splenic enlargement.  ASSESSMENT & PLAN:  Carcinoma of upper-outer quadrant of left breast in female, estrogen receptor positive ([place]) Metastatic breast cancer to the bone/lung. On taxol- Herceptin cycle weekly days-1,8,15 q 28days. AUG 6th- CT scan - significant improvement.  Clinically no progression of disease noted.   # Proceed with chemotherapy today. Tolerating chemo well;. Labs today reviewed;  acceptable for treatment today- mild anemia.   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 Monitor closely.    # July 20th- MUGA scan- 61% [Jan 2018-baseline 63%].   # Elevated Blood pressure- monitor closely; get cuff; bring log.   # Bone metastases on X-geva [last 7/11]  # follow up with me in 2 weeks;chemo. Xgeva at next visit.  # I reviewed the blood work- with the patient in detail; also reviewed the imaging independently [as summarized above]; and with the patient in detail.    No orders of the defined types were placed in this encounter.      Cammie Sickle, MD 07/28/2017 10:20 PM

## 2017-07-24 NOTE — Progress Notes (Signed)
Patient is here today for a follow up. Patient reports no new concerns today.  

## 2017-07-28 ENCOUNTER — Telehealth: Payer: Self-pay | Admitting: Internal Medicine

## 2017-07-28 NOTE — Telephone Encounter (Signed)
Add X-geva to the schedule at next visit.

## 2017-07-29 NOTE — Telephone Encounter (Signed)
msg sent to sch. Team to add xgeva to schedule.

## 2017-08-07 ENCOUNTER — Inpatient Hospital Stay: Payer: Medicare Other

## 2017-08-07 ENCOUNTER — Inpatient Hospital Stay (HOSPITAL_BASED_OUTPATIENT_CLINIC_OR_DEPARTMENT_OTHER): Payer: Medicare Other | Admitting: Internal Medicine

## 2017-08-07 VITALS — BP 156/88 | HR 109 | Temp 97.6°F | Resp 20

## 2017-08-07 DIAGNOSIS — D649 Anemia, unspecified: Secondary | ICD-10-CM | POA: Diagnosis not present

## 2017-08-07 DIAGNOSIS — Z170 Estrogen receptor positive status [ER+]: Principal | ICD-10-CM

## 2017-08-07 DIAGNOSIS — Z79899 Other long term (current) drug therapy: Secondary | ICD-10-CM

## 2017-08-07 DIAGNOSIS — Z5112 Encounter for antineoplastic immunotherapy: Secondary | ICD-10-CM | POA: Diagnosis not present

## 2017-08-07 DIAGNOSIS — K7460 Unspecified cirrhosis of liver: Secondary | ICD-10-CM

## 2017-08-07 DIAGNOSIS — J90 Pleural effusion, not elsewhere classified: Secondary | ICD-10-CM | POA: Diagnosis not present

## 2017-08-07 DIAGNOSIS — E1122 Type 2 diabetes mellitus with diabetic chronic kidney disease: Secondary | ICD-10-CM

## 2017-08-07 DIAGNOSIS — C7951 Secondary malignant neoplasm of bone: Secondary | ICD-10-CM

## 2017-08-07 DIAGNOSIS — C50412 Malignant neoplasm of upper-outer quadrant of left female breast: Secondary | ICD-10-CM

## 2017-08-07 DIAGNOSIS — Z7982 Long term (current) use of aspirin: Secondary | ICD-10-CM | POA: Diagnosis not present

## 2017-08-07 DIAGNOSIS — I10 Essential (primary) hypertension: Secondary | ICD-10-CM

## 2017-08-07 DIAGNOSIS — C50611 Malignant neoplasm of axillary tail of right female breast: Secondary | ICD-10-CM

## 2017-08-07 DIAGNOSIS — E1165 Type 2 diabetes mellitus with hyperglycemia: Secondary | ICD-10-CM

## 2017-08-07 DIAGNOSIS — I700 Atherosclerosis of aorta: Secondary | ICD-10-CM | POA: Diagnosis not present

## 2017-08-07 DIAGNOSIS — Z5111 Encounter for antineoplastic chemotherapy: Secondary | ICD-10-CM | POA: Diagnosis not present

## 2017-08-07 DIAGNOSIS — Z809 Family history of malignant neoplasm, unspecified: Secondary | ICD-10-CM

## 2017-08-07 DIAGNOSIS — R161 Splenomegaly, not elsewhere classified: Secondary | ICD-10-CM | POA: Diagnosis not present

## 2017-08-07 DIAGNOSIS — G629 Polyneuropathy, unspecified: Secondary | ICD-10-CM

## 2017-08-07 DIAGNOSIS — C50612 Malignant neoplasm of axillary tail of left female breast: Secondary | ICD-10-CM

## 2017-08-07 LAB — COMPREHENSIVE METABOLIC PANEL
ALT: 30 U/L (ref 14–54)
ANION GAP: 10 (ref 5–15)
AST: 44 U/L — ABNORMAL HIGH (ref 15–41)
Albumin: 3.7 g/dL (ref 3.5–5.0)
Alkaline Phosphatase: 112 U/L (ref 38–126)
BILIRUBIN TOTAL: 0.7 mg/dL (ref 0.3–1.2)
BUN: 11 mg/dL (ref 6–20)
CALCIUM: 9.1 mg/dL (ref 8.9–10.3)
CO2: 22 mmol/L (ref 22–32)
Chloride: 108 mmol/L (ref 101–111)
Creatinine, Ser: 0.8 mg/dL (ref 0.44–1.00)
GFR calc Af Amer: >60 mL/min (ref >60)
Glucose, Bld: 284 mg/dL — ABNORMAL HIGH (ref 65–99)
POTASSIUM: 3.8 mmol/L (ref 3.5–5.1)
Sodium: 140 mmol/L (ref 135–145)
TOTAL PROTEIN: 6.9 g/dL (ref 6.5–8.1)

## 2017-08-07 LAB — CBC WITH DIFFERENTIAL/PLATELET
BASOS ABS: 0 10*3/uL (ref 0–0.1)
BASOS PCT: 1 %
EOS ABS: 0.1 10*3/uL (ref 0–0.7)
EOS PCT: 2 %
HCT: 31.5 % — ABNORMAL LOW (ref 35.0–47.0)
HEMOGLOBIN: 10.3 g/dL — AB (ref 12.0–16.0)
Lymphocytes Relative: 23 %
Lymphs Abs: 1.2 10*3/uL (ref 1.0–3.6)
MCH: 27 pg (ref 26.0–34.0)
MCHC: 32.7 g/dL (ref 32.0–36.0)
MCV: 82.7 fL (ref 80.0–100.0)
Monocytes Absolute: 0.6 10*3/uL (ref 0.2–0.9)
Monocytes Relative: 12 %
NEUTROS PCT: 62 %
Neutro Abs: 3.4 10*3/uL (ref 1.4–6.5)
Platelets: 195 10*3/uL (ref 150–440)
RBC: 3.81 MIL/uL (ref 3.80–5.20)
RDW: 21.5 % — AB (ref 11.5–14.5)
WBC: 5.4 10*3/uL (ref 3.6–11.0)

## 2017-08-07 MED ORDER — TRASTUZUMAB CHEMO 150 MG IV SOLR
2.0000 mg/kg | Freq: Once | INTRAVENOUS | Status: AC
  Administered 2017-08-07: 126 mg via INTRAVENOUS
  Filled 2017-08-07: qty 6

## 2017-08-07 MED ORDER — HEPARIN SOD (PORK) LOCK FLUSH 100 UNIT/ML IV SOLN
500.0000 [IU] | Freq: Once | INTRAVENOUS | Status: AC
  Administered 2017-08-07: 500 [IU] via INTRAVENOUS
  Filled 2017-08-07: qty 5

## 2017-08-07 MED ORDER — ACETAMINOPHEN 325 MG PO TABS
650.0000 mg | ORAL_TABLET | Freq: Once | ORAL | Status: AC
  Administered 2017-08-07: 650 mg via ORAL
  Filled 2017-08-07: qty 2

## 2017-08-07 MED ORDER — DEXAMETHASONE SODIUM PHOSPHATE 100 MG/10ML IJ SOLN: 10.0000 mg | Freq: Once | INTRAMUSCULAR | Status: DC

## 2017-08-07 MED ORDER — SODIUM CHLORIDE 0.9 % IV SOLN
Freq: Once | INTRAVENOUS | Status: AC
  Administered 2017-08-07: 10:00:00 via INTRAVENOUS
  Filled 2017-08-07: qty 1000

## 2017-08-07 MED ORDER — DIPHENHYDRAMINE HCL 50 MG/ML IJ SOLN
50.0000 mg | Freq: Once | INTRAMUSCULAR | Status: AC
  Administered 2017-08-07: 50 mg via INTRAVENOUS
  Filled 2017-08-07: qty 1

## 2017-08-07 MED ORDER — DEXAMETHASONE SODIUM PHOSPHATE 10 MG/ML IJ SOLN
10.0000 mg | Freq: Once | INTRAMUSCULAR | Status: AC
  Administered 2017-08-07: 10 mg via INTRAVENOUS
  Filled 2017-08-07: qty 1

## 2017-08-07 MED ORDER — DENOSUMAB 120 MG/1.7ML ~~LOC~~ SOLN
120.0000 mg | Freq: Once | SUBCUTANEOUS | Status: AC
  Administered 2017-08-07: 120 mg via SUBCUTANEOUS
  Filled 2017-08-07: qty 1.7

## 2017-08-07 MED ORDER — DEXTROSE 5 % IV SOLN
80.0000 mg/m2 | Freq: Once | INTRAVENOUS | Status: AC
  Administered 2017-08-07: 138 mg via INTRAVENOUS
  Filled 2017-08-07: qty 23

## 2017-08-07 MED ORDER — FAMOTIDINE IN NACL 20-0.9 MG/50ML-% IV SOLN
20.0000 mg | Freq: Once | INTRAVENOUS | Status: AC
  Administered 2017-08-07: 20 mg via INTRAVENOUS
  Filled 2017-08-07: qty 50

## 2017-08-07 MED ORDER — SODIUM CHLORIDE 0.9% FLUSH
10.0000 mL | Freq: Once | INTRAVENOUS | Status: AC
  Administered 2017-08-07: 10 mL via INTRAVENOUS
  Filled 2017-08-07: qty 10

## 2017-08-07 NOTE — Assessment & Plan Note (Addendum)
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 On taxol- Herceptin cycle weekly days-1,8,15 q 28days. AUG 6th- CT scan - significant improvement.  Clinically no progression of disease noted.   # Proceed with chemotherapy today. Tolerating chemo well;. 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 Monitor closely.    # July 20th- MUGA scan- 61% [Jan 2018-baseline 63%]. We'll again repeat in October 2018.  # Elevated Blood pressure- monitor closely; as per patient [facility] at [facility]]  # Elevated Blood sugars- 284 today. check Hb 1Ac at next visit.   # Bone metastases on X-geva [last 7/11]; proceed with Xgeva today. Calcium-slightly low.  # follow up with me in 4 weeks [sec to holidays];chemo. Xgeva at next visit.  Chemo in 1 week/labs.

## 2017-08-07 NOTE — Progress Notes (Signed)
[place] OFFICE PROGRESS NOTE  Patient Care Team: Kathrine Haddock, NP as PCP - General (Nurse Practitioner) Bary Castilla Forest Gleason, MD (General Surgery) Forest Gleason, MD (Oncology)  Breast cancer [facility])   Staging form: Breast, AJCC 7th Edition     Clinical stage from 04/06/2015: Stage IV (T2, N2, M1) - Signed by Evlyn Kanner, NP on 04/06/2015    Oncology History   1.history of carcinoma of right breast status post lumpectomy and radiation therapy (upper and outer quadrant) February 21, 2009.  Estrogen receptor 90%.  Progesterone receptor 60%.  Patient had lumpectomy and MammoSite partial breast radiation therapy followed by tamoxifen for 5 years.  Patient is still taking tamoxifen.. 2 recent mammogram in December of 2015 negative.  Further evaluation by surgeon revealed a palpable mass in the left upper outer quadrant near axilla.  Ultrasound revealed one small normal-appearing lymph node 0.6 cm in lower limit of axilla.  Adjacent to this there was ill-defined multilobulated hypoechoic mass 1.8 x 2.6 cm.  Biopsy of the left axillary mass was done which was positive for high-grade carcinoma.   2,estrogen receptor positive.  Progesterone receptor positive.  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 3.  MRI scan of breast (January, 2015) reveals multiple abnormal enlarged left axillary lymph node There are 2 oval some circumscribed mass at 2:30 and 3:00 position of the left breast.  0.6 cm x 1.3 cm and 4 x 7 mm Non-mass enhancement extending 2.4 cm these masses is 3.6 cm mass.  Findings suggestive of several pulmonary nodules.  PET  scan shows extensive disease, 4.started on Taxotere,perjeta, Herceptin(January 03, 2015- June 2016.   # June 2016- Herceptin [Letrozole + Ibrance] until May 2017.   # MAY 2017- PET PROGRESSION; # July 2017- START KADCYLA  x4 cycles- SEP 15th 2017CT- PROGRESSION  # SEP 2017- START Taxol + Herceptin.   # Right sided Pleural effusion [s/p Thora Sep  2017]  # Bone mets- X-geva q 6W  # MUGA Jan 18th-63%     Carcinoma of upper-outer quadrant of left breast in female, estrogen receptor positive ([place])   09/28/2016 Initial Diagnosis    Carcinoma of upper-outer quadrant of left breast in female, estrogen receptor positive ([place])       INTERVAL HISTORY:  Tammy Pope 71 y.o.  female pleasant patient above history of ER/PR positive HER-2/neu positive metastatic breast cancer to the bone and lung- Currently on Herceptin and Taxol is here for follow-up/Proceed with chemotherapy.  Patient continues to complain of mild tingling and numbness in the hands. This is not getting any worse. This is not interrupting a daily absent. She continues to deny any worsening shortness of breath or cough. Denies any chest pain. No headache. No jaw pain. Denies any new lumps or bumps.No falls. No swelling in the legs. No headaches.  REVIEW OF SYSTEMS:  A complete 10 point review of system is done which is negative except mentioned above/history of present illness.   PAST MEDICAL HISTORY :  Past Medical History:  Diagnosis Date  . Axillary mass 12/15/2014  . Breast cancer ([place]) 02/21/2009  . Cancer [facility]) 2010   right breast ductal carcinoma in situ  . Diabetes mellitus without complication ([place])   . Floaters 2014  . Hyperlipidemia 1990  . Hypertension 2009  . Malignant neoplasm of axillary tail of female breast ([place]) 12/16/2014  . Malignant neoplasm of upper-outer quadrant of female breast ([place]) 02/21/2009   DCIS of the right breast, intermediate grade, resected  the negative margins. ER 90%, PR 50%, MammoSite partial breast radiation  . 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 Special screening for malignant neoplasms, colon     PAST SURGICAL HISTORY :   Past Surgical History:  Procedure Laterality Date  . APPENDECTOMY  1956  . BREAST BIOPSY Right 2010   stereo  . BREAST SURGERY Right 2010   wide excision  .  COLONOSCOPY  2008   ??? Md  . left axilla biopsy Left 12-14-14  . PARTIAL HYSTERECTOMY  1977    FAMILY HISTORY :   Family History  Problem Relation Age of Onset  . Cancer Other        unknown family members with ovarian,colon,breast cancers  . Hypertension Mother   . Heart disease Daughter        MI    SOCIAL HISTORY:   Social History  Substance Use Topics  . Smoking status: Never Smoker  . Smokeless tobacco: Never Used  . Alcohol use No    ALLERGIES:  is allergic to penicillin g and sulfa antibiotics.  MEDICATIONS:  Current Outpatient Prescriptions  Medication Sig Dispense Refill  . ALPRAZolam (XANAX) 0.5 MG tablet TAKE ONE TABLET BY MOUTH 3 TIMES DAILY AS NEEDED 90 tablet 1  . amLODipine (NORVASC) 5 MG tablet Take 1 tablet (5 mg total) by mouth daily. 90 tablet 3  . aspirin 81 MG tablet Take 81 mg by mouth daily.    . Calcium 600-200 MG-UNIT tablet Take 1 tablet by mouth 2 (two) times daily. 120 tablet 3  . cephALEXin (KEFLEX) 500 MG capsule Take 1 capsule (500 mg total) by mouth 3 (three) times daily. 30 capsule 0  . metFORMIN (GLUCOPHAGE) 500 MG tablet Take 2 tablets (1,000 mg total) by mouth 2 (two) times daily with a meal. 360 tablet 5  . Multiple Vitamins-Minerals (MULTIVITAMIN ADULT PO) Take 1 tablet by mouth daily.     . ONE TOUCH ULTRA TEST test strip     . potassium chloride SA (KLOR-CON M20) 20 MEQ tablet Take 2 tablets (40 mEq total) by mouth daily. 60 tablet 3  . rosuvastatin (CRESTOR) 5 MG tablet Take 1 tablet (5 mg total) by mouth at bedtime. 90 tablet 1  . valsartan-hydrochlorothiazide (DIOVAN-HCT) 320-25 MG tablet Take 1 tablet by mouth daily. 90 tablet 3   No current facility-administered medications for this visit.    Facility-Administered Medications Ordered in Other Visits  Medication Dose Route Frequency Provider Last Rate Last Dose  . heparin lock flush 100 unit/mL  250 Units Intracatheter PRN Choksi, Janak, MD      . sodium chloride 0.9 % 250 mL  with potassium chloride 40 mEq infusion   Intravenous Continuous Forest Gleason, MD   Stopped at 05/03/15 1229  . sodium chloride 0.9 % injection 10 mL  10 mL Intracatheter PRN Forest Gleason, MD   10 mL at 04/18/15 1545    PHYSICAL EXAMINATION: ECOG PERFORMANCE STATUS: 0 - Asymptomatic  BP (!) 156/88   Pulse (!) 109   Temp 97.6 F (36.4 C) (Tympanic)   Resp 20   LMP  (LMP Unknown)   There were no vitals filed for this visit.  GENERAL: Well-nourished well-developed; Alert, no distress and comfortable.   Accompanied by her husband. She is walking herself. EYES: no pallor or icterus OROPHARYNX: no thrush or ulceration; poor dentition  NECK: supple, no masses felt.  LYMPH:  no palpable lymphadenopathy in the cervical, axillary or inguinal  regions LUNGS: clear to auscultation and  No wheeze or crackles HEART/CVS: regular rate & rhythm and no murmurs; No lower extremity edema ABDOMEN:abdomen soft, non-tender and normal bowel sounds Musculoskeletal:no cyanosis of digits and no clubbing  PSYCH: alert & oriented x 3 with fluent speech NEURO: no focal motor/sensory deficits SKIN:  no rashes or significant lesions LEFT BREAST- 1-2cm mass left upper outer quadrant/axillary tail. Nontender. Skin: macular rash noted on the bilateral extremities  LABORATORY DATA:  I have reviewed the data as listed    Component Value Date/Time   NA 140 08/07/2017 0849   NA 141 06/03/2017 0925   NA 138 03/28/2015 0923   K 3.8 08/07/2017 0849   K 3.3 (L) 03/28/2015 0923   CL 108 08/07/2017 0849   CL 101 03/28/2015 0923   CO2 22 08/07/2017 0849   CO2 28 03/28/2015 0923   GLUCOSE 284 (H) 08/07/2017 0849   GLUCOSE 173 (H) 03/28/2015 0923   BUN 11 08/07/2017 0849   BUN 7 (L) 06/03/2017 0925   BUN 9 03/28/2015 0923   CREATININE 0.80 08/07/2017 0849   CREATININE 0.56 03/28/2015 0923   CALCIUM 9.1 08/07/2017 0849   CALCIUM 8.6 (L) 03/28/2015 0923   PROT 6.9 08/07/2017 0849   PROT 6.4 06/03/2017 0925    PROT 7.1 03/28/2015 0923   ALBUMIN 3.7 08/07/2017 0849   ALBUMIN 4.0 06/03/2017 0925   ALBUMIN 3.6 03/28/2015 0923   AST 44 (H) 08/07/2017 0849   AST 43 (H) 03/28/2015 0923   ALT 30 08/07/2017 0849   ALT 26 03/28/2015 0923   ALKPHOS 112 08/07/2017 0849   ALKPHOS 85 03/28/2015 0923   BILITOT 0.7 08/07/2017 0849   BILITOT 0.3 06/03/2017 0925   BILITOT 0.6 03/28/2015 0923   GFRNONAA >60 08/07/2017 0849   GFRNONAA >60 03/28/2015 0923   GFRAA >60 08/07/2017 0849   GFRAA >60 03/28/2015 0923    No results found for: SPEP, UPEP  Lab Results  Component Value Date   WBC 5.4 08/07/2017   NEUTROABS 3.4 08/07/2017   HGB 10.3 (L) 08/07/2017   HCT 31.5 (L) 08/07/2017   MCV 82.7 08/07/2017   PLT 195 08/07/2017      Chemistry      Component Value Date/Time   NA 140 08/07/2017 0849   NA 141 06/03/2017 0925   NA 138 03/28/2015 0923   K 3.8 08/07/2017 0849   K 3.3 (L) 03/28/2015 0923   CL 108 08/07/2017 0849   CL 101 03/28/2015 0923   CO2 22 08/07/2017 0849   CO2 28 03/28/2015 0923   BUN 11 08/07/2017 0849   BUN 7 (L) 06/03/2017 0925   BUN 9 03/28/2015 0923   CREATININE 0.80 08/07/2017 0849   CREATININE 0.56 03/28/2015 0923      Component Value Date/Time   CALCIUM 9.1 08/07/2017 0849   CALCIUM 8.6 (L) 03/28/2015 0923   ALKPHOS 112 08/07/2017 0849   ALKPHOS 85 03/28/2015 0923   AST 44 (H) 08/07/2017 0849   AST 43 (H) 03/28/2015 0923   ALT 30 08/07/2017 0849   ALT 26 03/28/2015 0923   BILITOT 0.7 08/07/2017 0849   BILITOT 0.3 06/03/2017 0925   BILITOT 0.6 03/28/2015 0923     IMPRESSION: 1. No new or progressive metastatic disease. Stable widespread patchy confluent sclerotic osseous metastases throughout the axial and proximal appendicular skeleton. 2. 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 3. Stable  morphologic  changes of cirrhosis without discrete liver lesions. 4.  Aortic Atherosclerosis (ICD10-I70.0).   Electronically Signed   By: Ilona Sorrel M.D.   On: 07/22/2017 10:02  IMPRESSION: Normal LEFT ventricular ejection fraction of 58%, slightly decreased from the 63% on the prior exam.  Normal LEFT ventricular wall motion.  Mild splenic enlargement.  ASSESSMENT & PLAN:  Carcinoma of upper-outer quadrant of left breast in female, estrogen receptor positive ([place]) Metastatic breast cancer to the bone/lung. On taxol- Herceptin cycle weekly days-1,8,15 q 28days. AUG 6th- CT scan - significant improvement.  Clinically no progression of disease noted.   # Proceed with chemotherapy today. Tolerating chemo well;. 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 Monitor closely.    # July 20th- MUGA scan- 61% [Jan 2018-baseline 63%]. We'll again repeat in October 2018.  # Elevated Blood pressure- monitor closely;[facility] home [facility]]  # Elevated Blood sugars- check Hb 1Ac at next visit.   # Bone metastases on X-geva [last 7/11]; proceed with Xgeva today. Calcium-slightly low.  # follow up with me in 4 weeks [sec to holidays];chemo. Xgeva at next visit.  Chemo in 1 week/labs.   Orders Placed This Encounter  Procedures  . CBC with Differential    Standing Status:   Future    Standing Expiration Date:   08/07/2018  . Comprehensive metabolic panel    Standing Status:   Future    Standing Expiration Date:   08/07/2018  . Hemoglobin A1c    Standing Status:   Future    Standing Expiration Date:   08/07/2018       Cammie Sickle, MD 08/07/2017 11:01 PM

## 2017-08-13 ENCOUNTER — Telehealth: Payer: Self-pay | Admitting: Unknown Physician Specialty

## 2017-08-13 NOTE — Telephone Encounter (Signed)
Called pt to schedule for Annual Wellness Visit with Nurse Health Advisor, Tiffany Hill, my c/b # is [phone]  Kathryn Brown ° °

## 2017-08-14 ENCOUNTER — Other Ambulatory Visit: Payer: Self-pay | Admitting: Internal Medicine

## 2017-08-14 ENCOUNTER — Inpatient Hospital Stay: Payer: Medicare Other

## 2017-08-14 VITALS — BP 145/76 | HR 110 | Temp 97.6°F | Resp 18

## 2017-08-14 DIAGNOSIS — Z170 Estrogen receptor positive status [ER+]: Secondary | ICD-10-CM | POA: Diagnosis not present

## 2017-08-14 DIAGNOSIS — Z5112 Encounter for antineoplastic immunotherapy: Secondary | ICD-10-CM | POA: Diagnosis not present

## 2017-08-14 DIAGNOSIS — Z5111 Encounter for antineoplastic chemotherapy: Secondary | ICD-10-CM | POA: Diagnosis not present

## 2017-08-14 DIAGNOSIS — C50412 Malignant neoplasm of upper-outer quadrant of left female breast: Secondary | ICD-10-CM

## 2017-08-14 DIAGNOSIS — C50612 Malignant neoplasm of axillary tail of left female breast: Principal | ICD-10-CM

## 2017-08-14 DIAGNOSIS — C7951 Secondary malignant neoplasm of bone: Secondary | ICD-10-CM | POA: Diagnosis not present

## 2017-08-14 DIAGNOSIS — I10 Essential (primary) hypertension: Secondary | ICD-10-CM | POA: Diagnosis not present

## 2017-08-14 DIAGNOSIS — E1122 Type 2 diabetes mellitus with diabetic chronic kidney disease: Secondary | ICD-10-CM

## 2017-08-14 DIAGNOSIS — C50611 Malignant neoplasm of axillary tail of right female breast: Secondary | ICD-10-CM

## 2017-08-14 LAB — CBC WITH DIFFERENTIAL/PLATELET
BASOS ABS: 0 10*3/uL (ref 0–0.1)
Basophils Relative: 1 %
Eosinophils Absolute: 0.2 10*3/uL (ref 0–0.7)
Eosinophils Relative: 4 %
HEMATOCRIT: 29.9 % — AB (ref 35.0–47.0)
HEMOGLOBIN: 9.8 g/dL — AB (ref 12.0–16.0)
Lymphocytes Relative: 20 %
Lymphs Abs: 1 10*3/uL (ref 1.0–3.6)
MCH: 27 pg (ref 26.0–34.0)
MCHC: 32.7 g/dL (ref 32.0–36.0)
MCV: 82.6 fL (ref 80.0–100.0)
MONO ABS: 0.2 10*3/uL (ref 0.2–0.9)
Monocytes Relative: 5 %
NEUTROS ABS: 3.6 10*3/uL (ref 1.4–6.5)
NEUTROS PCT: 70 %
Platelets: 187 10*3/uL (ref 150–440)
RBC: 3.63 MIL/uL — AB (ref 3.80–5.20)
RDW: 21.5 % — AB (ref 11.5–14.5)
WBC: 5.1 10*3/uL (ref 3.6–11.0)

## 2017-08-14 LAB — HEMOGLOBIN A1C
Hgb A1c MFr Bld: 6.7 % — ABNORMAL HIGH (ref 4.8–5.6)
MEAN PLASMA GLUCOSE: 145.59 mg/dL

## 2017-08-14 MED ORDER — DIPHENHYDRAMINE HCL 50 MG/ML IJ SOLN
50.0000 mg | Freq: Once | INTRAMUSCULAR | Status: AC
  Administered 2017-08-14: 50 mg via INTRAVENOUS
  Filled 2017-08-14: qty 1

## 2017-08-14 MED ORDER — HEPARIN SOD (PORK) LOCK FLUSH 100 UNIT/ML IV SOLN
500.0000 [IU] | Freq: Once | INTRAVENOUS | Status: AC | PRN
  Administered 2017-08-14: 500 [IU]
  Filled 2017-08-14: qty 5

## 2017-08-14 MED ORDER — SODIUM CHLORIDE 0.9 % IV SOLN: 10.0000 mg | Freq: Once | INTRAVENOUS | Status: DC

## 2017-08-14 MED ORDER — FAMOTIDINE IN NACL 20-0.9 MG/50ML-% IV SOLN
20.0000 mg | Freq: Once | INTRAVENOUS | Status: AC
  Administered 2017-08-14: 20 mg via INTRAVENOUS
  Filled 2017-08-14: qty 50

## 2017-08-14 MED ORDER — DEXAMETHASONE SODIUM PHOSPHATE 10 MG/ML IJ SOLN
10.0000 mg | Freq: Once | INTRAMUSCULAR | Status: AC
  Administered 2017-08-14: 10 mg via INTRAVENOUS
  Filled 2017-08-14: qty 1

## 2017-08-14 MED ORDER — SODIUM CHLORIDE 0.9 % IV SOLN
Freq: Once | INTRAVENOUS | Status: AC
  Administered 2017-08-14: 09:00:00 via INTRAVENOUS
  Filled 2017-08-14: qty 1000

## 2017-08-14 MED ORDER — TRASTUZUMAB CHEMO 150 MG IV SOLR
2.0000 mg/kg | Freq: Once | INTRAVENOUS | Status: AC
  Administered 2017-08-14: 126 mg via INTRAVENOUS
  Filled 2017-08-14: qty 6

## 2017-08-14 MED ORDER — ACETAMINOPHEN 325 MG PO TABS
650.0000 mg | ORAL_TABLET | Freq: Once | ORAL | Status: AC
  Administered 2017-08-14: 650 mg via ORAL
  Filled 2017-08-14: qty 2

## 2017-08-14 MED ORDER — SODIUM CHLORIDE 0.9% FLUSH
10.0000 mL | INTRAVENOUS | Status: DC | PRN
  Filled 2017-08-14: qty 10

## 2017-08-14 MED ORDER — PACLITAXEL CHEMO INJECTION 300 MG/50ML
80.0000 mg/m2 | Freq: Once | INTRAVENOUS | Status: AC
  Administered 2017-08-14: 138 mg via INTRAVENOUS
  Filled 2017-08-14: qty 23

## 2017-08-29 ENCOUNTER — Telehealth: Payer: Self-pay | Admitting: *Deleted

## 2017-08-29 NOTE — Telephone Encounter (Signed)
-----   Message from Shirlean Kelly, RN sent at 08/28/2017  4:41 PM EDT ----- Regarding: Phone Call Patient called but did not leave message.  She called the [phone] extension.  All she said was hello...hello.Tammy Pope

## 2017-08-29 NOTE — Telephone Encounter (Signed)
I personally left a vm that call was being returned.

## 2017-09-01 ENCOUNTER — Telehealth: Payer: Self-pay | Admitting: Internal Medicine

## 2017-09-02 ENCOUNTER — Telehealth: Payer: Self-pay | Admitting: Unknown Physician Specialty

## 2017-09-02 NOTE — Telephone Encounter (Signed)
Talked to husband to give condolences about his wife's death

## 2017-09-04 ENCOUNTER — Inpatient Hospital Stay: Payer: Medicare Other

## 2017-09-04 ENCOUNTER — Inpatient Hospital Stay: Payer: Medicare Other | Admitting: Internal Medicine

## 2017-09-16 NOTE — Telephone Encounter (Signed)
Spoke to [employer]/[employer] dept- reports that pt was found unresponsive/dead by family. Will sign the death certificate.

## 2017-09-16 DEATH — deceased

## 2017-09-27 ENCOUNTER — Other Ambulatory Visit: Payer: Self-pay | Admitting: Nurse Practitioner

## 2017-12-03 ENCOUNTER — Ambulatory Visit: Payer: Medicare Other | Admitting: Unknown Physician Specialty

## 2018-06-23 IMAGING — NM NM CARDIA MUGA REST
9 series · 41 of 41 positions shown · non-contrast
Comparison: 04/11/2017

CLINICAL DATA: Left breast cancer.

EXAM:
NUCLEAR MEDICINE CARDIAC BLOOD POOL IMAGING (MUGA)
TECHNIQUE: Cardiac multi-gated acquisition was performed at rest following
intravenous injection of 9c-99m labeled red blood cells.
RADIOPHARMACEUTICALS:  19.4 mCi 9c-99m MDP in-vitro labeled red
blood cells IV

[Series 1000: 45 lao-gated (functional) · 3.30mm/px · 8 of 8 slices shown]
[im 1/8]
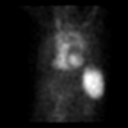
[im 2/8]
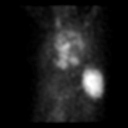
[im 3/8]
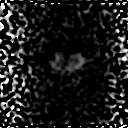
[im 4/8  full-range]
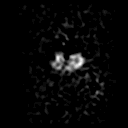
[im 5/8  full-range]
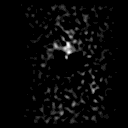
[im 6/8  full-range]
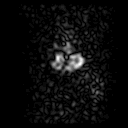
[im 7/8]
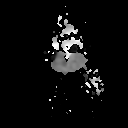
[im 8/8]
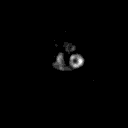

[Series 1000: anterior · 3.30mm/px · 1 of 1 slices shown]
[im 1/1]
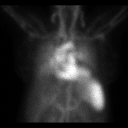

[Series 1000: 70 degree · 3.30mm/px · 1 of 1 slices shown]
[im 1/1]
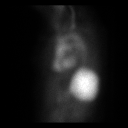

[Series 1000: 45 lao-gated · 3.30mm/px · 6 of 24 frames shown]
[frame 3/24]
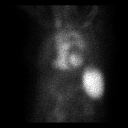
[frame 7/24]
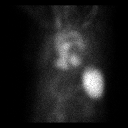
[frame 11/24]
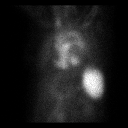
[frame 15/24]
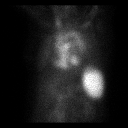
[frame 19/24]
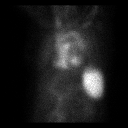
[frame 23/24]
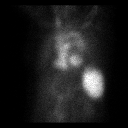

[Series 1000: anterior-gated · 3.30mm/px · 6 of 24 frames shown]
[frame 3/24]
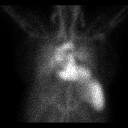
[frame 7/24]
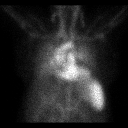
[frame 11/24]
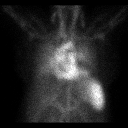
[frame 15/24]
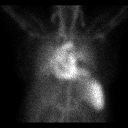
[frame 19/24]
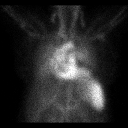
[frame 23/24]
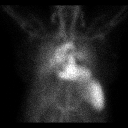

[Series 1000: 45 lao-gated (results) · 3.30mm/px · 6 of 24 frames shown]
[frame 3/24]
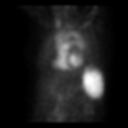
[frame 7/24]
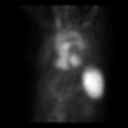
[frame 11/24]
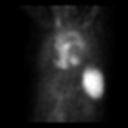
[frame 15/24]
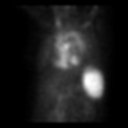
[frame 19/24]
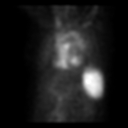
[frame 23/24]
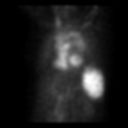

[Series 1000: 45 lao · 3.30mm/px · 1 of 1 slices shown]
[im 1/1]
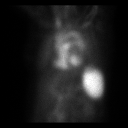

[Series 1000: 70 degree-gated · 3.30mm/px · 6 of 24 frames shown]
[frame 3/24]
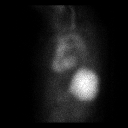
[frame 7/24]
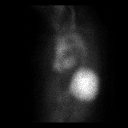
[frame 11/24]
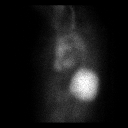
[frame 15/24]
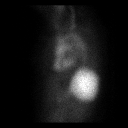
[frame 19/24]
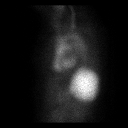
[frame 23/24]
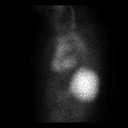

[Series 1000: 45 lao-gated (original with roi) · 3.30mm/px · 6 of 24 frames shown]
[frame 3/24]
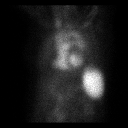
[frame 7/24]
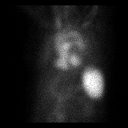
[frame 11/24]
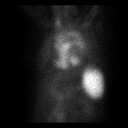
[frame 15/24]
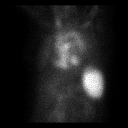
[frame 19/24]
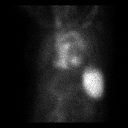
[frame 23/24]
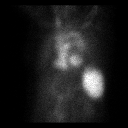

[41 of 41 positions shown; findings below may reference images not displayed]

FINDINGS: The left ventricular ejection fraction is equal to 61.1%. On the
previous exam the ejection fraction was equal to 58.4%. No left
ventricular wall motion abnormalities identified. The spleen appears
enlarged as before.
IMPRESSION: 1. Left ventricular ejection fraction is slightly increased to 61.1%
from 58.4% previously.
